# Patient Record
Sex: Male | Born: 1940
Health system: Southern US, Community
[De-identification: ages and names within clinical notes are randomized; demographics above are authoritative.]

## PROBLEM LIST (undated history)

## (undated) DIAGNOSIS — G20 Parkinson's disease: Secondary | ICD-10-CM

## (undated) DIAGNOSIS — R413 Other amnesia: Secondary | ICD-10-CM

## (undated) DIAGNOSIS — I10 Essential (primary) hypertension: Secondary | ICD-10-CM

## (undated) HISTORY — PX: DEEP THIGH / KNEE TUMOR EXCISION: SUR571

## (undated) HISTORY — DX: Essential (primary) hypertension: I10

## (undated) HISTORY — DX: Parkinson's disease: G20

---
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
## 2001-10-09 ENCOUNTER — Other Ambulatory Visit: Admission: RE | Admit: 2001-10-09 | Discharge: 2001-10-09 | Payer: Self-pay | Admitting: Urology

## 2005-11-05 ENCOUNTER — Encounter: Admission: RE | Admit: 2005-11-05 | Discharge: 2005-11-18 | Payer: Self-pay | Admitting: Family Medicine

## 2005-12-30 HISTORY — PX: CERVICAL DISC SURGERY: SHX588

## 2006-01-27 ENCOUNTER — Ambulatory Visit (HOSPITAL_COMMUNITY): Admission: RE | Admit: 2006-01-27 | Discharge: 2006-01-27 | Payer: Self-pay | Admitting: Neurosurgery

## 2006-03-24 ENCOUNTER — Encounter: Admission: RE | Admit: 2006-03-24 | Discharge: 2006-03-24 | Payer: Self-pay | Admitting: Neurosurgery

## 2006-06-30 ENCOUNTER — Encounter: Admission: RE | Admit: 2006-06-30 | Discharge: 2006-06-30 | Payer: Self-pay | Admitting: Orthopaedic Surgery

## 2006-12-30 HISTORY — PX: COLONOSCOPY: SHX174

## 2007-02-13 ENCOUNTER — Ambulatory Visit (HOSPITAL_COMMUNITY): Admission: RE | Admit: 2007-02-13 | Discharge: 2007-02-13 | Payer: Self-pay | Admitting: General Surgery

## 2012-06-02 DIAGNOSIS — D179 Benign lipomatous neoplasm, unspecified: Secondary | ICD-10-CM | POA: Diagnosis not present

## 2012-06-02 DIAGNOSIS — C4920 Malignant neoplasm of connective and soft tissue of unspecified lower limb, including hip: Secondary | ICD-10-CM | POA: Diagnosis not present

## 2012-06-02 DIAGNOSIS — D481 Neoplasm of uncertain behavior of connective and other soft tissue: Secondary | ICD-10-CM | POA: Insufficient documentation

## 2012-07-06 DIAGNOSIS — M542 Cervicalgia: Secondary | ICD-10-CM | POA: Diagnosis not present

## 2012-08-06 DIAGNOSIS — M542 Cervicalgia: Secondary | ICD-10-CM | POA: Diagnosis not present

## 2012-09-09 DIAGNOSIS — M542 Cervicalgia: Secondary | ICD-10-CM | POA: Diagnosis not present

## 2012-09-17 DIAGNOSIS — N401 Enlarged prostate with lower urinary tract symptoms: Secondary | ICD-10-CM | POA: Diagnosis not present

## 2012-10-02 DIAGNOSIS — N401 Enlarged prostate with lower urinary tract symptoms: Secondary | ICD-10-CM | POA: Diagnosis not present

## 2012-10-02 DIAGNOSIS — R972 Elevated prostate specific antigen [PSA]: Secondary | ICD-10-CM | POA: Diagnosis not present

## 2012-10-02 DIAGNOSIS — R3915 Urgency of urination: Secondary | ICD-10-CM | POA: Diagnosis not present

## 2012-10-26 DIAGNOSIS — Z23 Encounter for immunization: Secondary | ICD-10-CM | POA: Diagnosis not present

## 2012-11-16 DIAGNOSIS — I10 Essential (primary) hypertension: Secondary | ICD-10-CM | POA: Diagnosis not present

## 2012-11-16 DIAGNOSIS — C499 Malignant neoplasm of connective and soft tissue, unspecified: Secondary | ICD-10-CM | POA: Diagnosis not present

## 2012-11-16 DIAGNOSIS — R972 Elevated prostate specific antigen [PSA]: Secondary | ICD-10-CM | POA: Diagnosis not present

## 2012-11-16 DIAGNOSIS — Z23 Encounter for immunization: Secondary | ICD-10-CM | POA: Diagnosis not present

## 2013-03-25 DIAGNOSIS — L821 Other seborrheic keratosis: Secondary | ICD-10-CM | POA: Diagnosis not present

## 2013-03-25 DIAGNOSIS — L570 Actinic keratosis: Secondary | ICD-10-CM | POA: Diagnosis not present

## 2013-03-25 DIAGNOSIS — D485 Neoplasm of uncertain behavior of skin: Secondary | ICD-10-CM | POA: Diagnosis not present

## 2013-03-25 DIAGNOSIS — D239 Other benign neoplasm of skin, unspecified: Secondary | ICD-10-CM | POA: Diagnosis not present

## 2013-06-01 DIAGNOSIS — C4920 Malignant neoplasm of connective and soft tissue of unspecified lower limb, including hip: Secondary | ICD-10-CM | POA: Diagnosis not present

## 2013-06-01 DIAGNOSIS — D179 Benign lipomatous neoplasm, unspecified: Secondary | ICD-10-CM | POA: Diagnosis not present

## 2013-06-01 DIAGNOSIS — D481 Neoplasm of uncertain behavior of connective and other soft tissue: Secondary | ICD-10-CM | POA: Diagnosis not present

## 2013-06-09 DIAGNOSIS — I4460 Unspecified fascicular block: Secondary | ICD-10-CM | POA: Diagnosis not present

## 2013-06-09 DIAGNOSIS — Z01810 Encounter for preprocedural cardiovascular examination: Secondary | ICD-10-CM | POA: Diagnosis not present

## 2013-06-09 DIAGNOSIS — I10 Essential (primary) hypertension: Secondary | ICD-10-CM | POA: Diagnosis not present

## 2013-06-14 DIAGNOSIS — N400 Enlarged prostate without lower urinary tract symptoms: Secondary | ICD-10-CM | POA: Diagnosis not present

## 2013-06-14 DIAGNOSIS — G8918 Other acute postprocedural pain: Secondary | ICD-10-CM | POA: Diagnosis not present

## 2013-06-14 DIAGNOSIS — D1779 Benign lipomatous neoplasm of other sites: Secondary | ICD-10-CM | POA: Diagnosis not present

## 2013-06-14 DIAGNOSIS — C4920 Malignant neoplasm of connective and soft tissue of unspecified lower limb, including hip: Secondary | ICD-10-CM | POA: Diagnosis not present

## 2013-06-14 DIAGNOSIS — Z6833 Body mass index (BMI) 33.0-33.9, adult: Secondary | ICD-10-CM | POA: Diagnosis not present

## 2013-06-14 DIAGNOSIS — E669 Obesity, unspecified: Secondary | ICD-10-CM | POA: Diagnosis not present

## 2013-06-14 DIAGNOSIS — D481 Neoplasm of uncertain behavior of connective and other soft tissue: Secondary | ICD-10-CM | POA: Diagnosis not present

## 2013-06-14 DIAGNOSIS — C4922 Malignant neoplasm of connective and soft tissue of left lower limb, including hip: Secondary | ICD-10-CM | POA: Insufficient documentation

## 2013-06-14 DIAGNOSIS — D4819 Other specified neoplasm of uncertain behavior of connective and other soft tissue: Secondary | ICD-10-CM | POA: Diagnosis not present

## 2013-06-14 DIAGNOSIS — I10 Essential (primary) hypertension: Secondary | ICD-10-CM | POA: Diagnosis not present

## 2013-06-14 DIAGNOSIS — F17200 Nicotine dependence, unspecified, uncomplicated: Secondary | ICD-10-CM | POA: Diagnosis present

## 2013-06-16 DIAGNOSIS — N401 Enlarged prostate with lower urinary tract symptoms: Secondary | ICD-10-CM | POA: Insufficient documentation

## 2013-06-16 DIAGNOSIS — E669 Obesity, unspecified: Secondary | ICD-10-CM | POA: Insufficient documentation

## 2013-06-16 DIAGNOSIS — N400 Enlarged prostate without lower urinary tract symptoms: Secondary | ICD-10-CM | POA: Insufficient documentation

## 2013-06-16 DIAGNOSIS — I10 Essential (primary) hypertension: Secondary | ICD-10-CM | POA: Insufficient documentation

## 2013-06-24 DIAGNOSIS — Z483 Aftercare following surgery for neoplasm: Secondary | ICD-10-CM | POA: Diagnosis not present

## 2013-07-07 DIAGNOSIS — Z483 Aftercare following surgery for neoplasm: Secondary | ICD-10-CM | POA: Diagnosis not present

## 2013-07-07 DIAGNOSIS — D179 Benign lipomatous neoplasm, unspecified: Secondary | ICD-10-CM | POA: Diagnosis not present

## 2013-07-07 DIAGNOSIS — C4920 Malignant neoplasm of connective and soft tissue of unspecified lower limb, including hip: Secondary | ICD-10-CM | POA: Diagnosis not present

## 2013-07-27 DIAGNOSIS — C4920 Malignant neoplasm of connective and soft tissue of unspecified lower limb, including hip: Secondary | ICD-10-CM | POA: Diagnosis not present

## 2013-07-27 DIAGNOSIS — Z483 Aftercare following surgery for neoplasm: Secondary | ICD-10-CM | POA: Diagnosis not present

## 2013-09-30 DIAGNOSIS — R3915 Urgency of urination: Secondary | ICD-10-CM | POA: Diagnosis not present

## 2013-10-06 DIAGNOSIS — Z23 Encounter for immunization: Secondary | ICD-10-CM | POA: Diagnosis not present

## 2013-10-07 DIAGNOSIS — N401 Enlarged prostate with lower urinary tract symptoms: Secondary | ICD-10-CM | POA: Diagnosis not present

## 2013-10-07 DIAGNOSIS — R972 Elevated prostate specific antigen [PSA]: Secondary | ICD-10-CM | POA: Diagnosis not present

## 2014-02-01 DIAGNOSIS — M6250 Muscle wasting and atrophy, not elsewhere classified, unspecified site: Secondary | ICD-10-CM | POA: Diagnosis not present

## 2014-02-01 DIAGNOSIS — R609 Edema, unspecified: Secondary | ICD-10-CM | POA: Diagnosis not present

## 2014-02-01 DIAGNOSIS — C4920 Malignant neoplasm of connective and soft tissue of unspecified lower limb, including hip: Secondary | ICD-10-CM | POA: Diagnosis not present

## 2014-02-01 DIAGNOSIS — N400 Enlarged prostate without lower urinary tract symptoms: Secondary | ICD-10-CM | POA: Diagnosis not present

## 2014-02-07 DIAGNOSIS — R509 Fever, unspecified: Secondary | ICD-10-CM | POA: Diagnosis not present

## 2014-02-07 DIAGNOSIS — R6889 Other general symptoms and signs: Secondary | ICD-10-CM | POA: Diagnosis not present

## 2014-04-25 DIAGNOSIS — H269 Unspecified cataract: Secondary | ICD-10-CM | POA: Diagnosis not present

## 2014-04-25 DIAGNOSIS — H26019 Infantile and juvenile cortical, lamellar, or zonular cataract, unspecified eye: Secondary | ICD-10-CM | POA: Diagnosis not present

## 2014-06-01 DIAGNOSIS — Z23 Encounter for immunization: Secondary | ICD-10-CM | POA: Diagnosis not present

## 2014-06-01 DIAGNOSIS — I10 Essential (primary) hypertension: Secondary | ICD-10-CM | POA: Diagnosis not present

## 2014-08-02 DIAGNOSIS — R609 Edema, unspecified: Secondary | ICD-10-CM | POA: Diagnosis not present

## 2014-08-02 DIAGNOSIS — M6250 Muscle wasting and atrophy, not elsewhere classified, unspecified site: Secondary | ICD-10-CM | POA: Diagnosis not present

## 2014-08-02 DIAGNOSIS — Z4789 Encounter for other orthopedic aftercare: Secondary | ICD-10-CM | POA: Diagnosis not present

## 2014-08-02 DIAGNOSIS — N400 Enlarged prostate without lower urinary tract symptoms: Secondary | ICD-10-CM | POA: Diagnosis not present

## 2014-08-02 DIAGNOSIS — C4920 Malignant neoplasm of connective and soft tissue of unspecified lower limb, including hip: Secondary | ICD-10-CM | POA: Diagnosis not present

## 2014-08-02 DIAGNOSIS — Z483 Aftercare following surgery for neoplasm: Secondary | ICD-10-CM | POA: Diagnosis not present

## 2014-08-02 DIAGNOSIS — Z8589 Personal history of malignant neoplasm of other organs and systems: Secondary | ICD-10-CM | POA: Diagnosis not present

## 2014-08-02 DIAGNOSIS — J984 Other disorders of lung: Secondary | ICD-10-CM | POA: Diagnosis not present

## 2014-10-11 DIAGNOSIS — N401 Enlarged prostate with lower urinary tract symptoms: Secondary | ICD-10-CM | POA: Diagnosis not present

## 2014-10-14 DIAGNOSIS — R351 Nocturia: Secondary | ICD-10-CM | POA: Diagnosis not present

## 2014-10-14 DIAGNOSIS — R972 Elevated prostate specific antigen [PSA]: Secondary | ICD-10-CM | POA: Diagnosis not present

## 2014-10-14 DIAGNOSIS — N401 Enlarged prostate with lower urinary tract symptoms: Secondary | ICD-10-CM | POA: Diagnosis not present

## 2014-11-09 DIAGNOSIS — Z23 Encounter for immunization: Secondary | ICD-10-CM | POA: Diagnosis not present

## 2014-12-05 DIAGNOSIS — R197 Diarrhea, unspecified: Secondary | ICD-10-CM | POA: Diagnosis not present

## 2014-12-05 DIAGNOSIS — K529 Noninfective gastroenteritis and colitis, unspecified: Secondary | ICD-10-CM | POA: Diagnosis not present

## 2014-12-05 DIAGNOSIS — R011 Cardiac murmur, unspecified: Secondary | ICD-10-CM | POA: Diagnosis not present

## 2014-12-28 DIAGNOSIS — R011 Cardiac murmur, unspecified: Secondary | ICD-10-CM | POA: Diagnosis not present

## 2015-01-24 DIAGNOSIS — R05 Cough: Secondary | ICD-10-CM | POA: Diagnosis not present

## 2015-02-07 DIAGNOSIS — C4922 Malignant neoplasm of connective and soft tissue of left lower limb, including hip: Secondary | ICD-10-CM | POA: Diagnosis not present

## 2015-02-07 DIAGNOSIS — C4921 Malignant neoplasm of connective and soft tissue of right lower limb, including hip: Secondary | ICD-10-CM | POA: Diagnosis not present

## 2015-02-15 DIAGNOSIS — R972 Elevated prostate specific antigen [PSA]: Secondary | ICD-10-CM | POA: Diagnosis not present

## 2015-05-09 DIAGNOSIS — D1724 Benign lipomatous neoplasm of skin and subcutaneous tissue of left leg: Secondary | ICD-10-CM | POA: Diagnosis not present

## 2015-05-09 DIAGNOSIS — C4921 Malignant neoplasm of connective and soft tissue of right lower limb, including hip: Secondary | ICD-10-CM | POA: Diagnosis not present

## 2015-05-09 DIAGNOSIS — R52 Pain, unspecified: Secondary | ICD-10-CM | POA: Diagnosis not present

## 2015-05-09 DIAGNOSIS — N433 Hydrocele, unspecified: Secondary | ICD-10-CM | POA: Diagnosis not present

## 2015-05-22 DIAGNOSIS — H2513 Age-related nuclear cataract, bilateral: Secondary | ICD-10-CM | POA: Diagnosis not present

## 2015-05-22 DIAGNOSIS — H25013 Cortical age-related cataract, bilateral: Secondary | ICD-10-CM | POA: Diagnosis not present

## 2015-06-07 DIAGNOSIS — Z79899 Other long term (current) drug therapy: Secondary | ICD-10-CM | POA: Diagnosis not present

## 2015-06-07 DIAGNOSIS — I10 Essential (primary) hypertension: Secondary | ICD-10-CM | POA: Diagnosis not present

## 2015-06-07 DIAGNOSIS — R972 Elevated prostate specific antigen [PSA]: Secondary | ICD-10-CM | POA: Diagnosis not present

## 2015-06-07 DIAGNOSIS — C499 Malignant neoplasm of connective and soft tissue, unspecified: Secondary | ICD-10-CM | POA: Diagnosis not present

## 2015-06-07 DIAGNOSIS — B351 Tinea unguium: Secondary | ICD-10-CM | POA: Diagnosis not present

## 2015-06-26 DIAGNOSIS — B351 Tinea unguium: Secondary | ICD-10-CM | POA: Diagnosis not present

## 2015-06-26 DIAGNOSIS — R972 Elevated prostate specific antigen [PSA]: Secondary | ICD-10-CM | POA: Diagnosis not present

## 2015-06-26 DIAGNOSIS — Z79899 Other long term (current) drug therapy: Secondary | ICD-10-CM | POA: Diagnosis not present

## 2015-06-26 DIAGNOSIS — I10 Essential (primary) hypertension: Secondary | ICD-10-CM | POA: Diagnosis not present

## 2015-06-26 DIAGNOSIS — C499 Malignant neoplasm of connective and soft tissue, unspecified: Secondary | ICD-10-CM | POA: Diagnosis not present

## 2015-09-12 DIAGNOSIS — J309 Allergic rhinitis, unspecified: Secondary | ICD-10-CM | POA: Diagnosis not present

## 2015-09-12 DIAGNOSIS — R05 Cough: Secondary | ICD-10-CM | POA: Diagnosis not present

## 2015-10-11 DIAGNOSIS — R972 Elevated prostate specific antigen [PSA]: Secondary | ICD-10-CM | POA: Diagnosis not present

## 2015-10-20 DIAGNOSIS — N401 Enlarged prostate with lower urinary tract symptoms: Secondary | ICD-10-CM | POA: Diagnosis not present

## 2015-10-20 DIAGNOSIS — Z23 Encounter for immunization: Secondary | ICD-10-CM | POA: Diagnosis not present

## 2015-10-20 DIAGNOSIS — R351 Nocturia: Secondary | ICD-10-CM | POA: Diagnosis not present

## 2015-10-20 DIAGNOSIS — N138 Other obstructive and reflux uropathy: Secondary | ICD-10-CM | POA: Diagnosis not present

## 2015-10-20 DIAGNOSIS — R972 Elevated prostate specific antigen [PSA]: Secondary | ICD-10-CM | POA: Diagnosis not present

## 2015-11-14 DIAGNOSIS — Z4789 Encounter for other orthopedic aftercare: Secondary | ICD-10-CM | POA: Diagnosis not present

## 2015-11-14 DIAGNOSIS — D1723 Benign lipomatous neoplasm of skin and subcutaneous tissue of right leg: Secondary | ICD-10-CM | POA: Diagnosis not present

## 2015-11-14 DIAGNOSIS — C4921 Malignant neoplasm of connective and soft tissue of right lower limb, including hip: Secondary | ICD-10-CM | POA: Diagnosis not present

## 2016-05-18 DIAGNOSIS — R05 Cough: Secondary | ICD-10-CM | POA: Diagnosis not present

## 2016-05-21 DIAGNOSIS — D1723 Benign lipomatous neoplasm of skin and subcutaneous tissue of right leg: Secondary | ICD-10-CM | POA: Diagnosis not present

## 2016-05-21 DIAGNOSIS — Z4789 Encounter for other orthopedic aftercare: Secondary | ICD-10-CM | POA: Diagnosis not present

## 2016-05-21 DIAGNOSIS — C4921 Malignant neoplasm of connective and soft tissue of right lower limb, including hip: Secondary | ICD-10-CM | POA: Diagnosis not present

## 2016-06-07 DIAGNOSIS — R011 Cardiac murmur, unspecified: Secondary | ICD-10-CM | POA: Diagnosis not present

## 2016-06-07 DIAGNOSIS — I10 Essential (primary) hypertension: Secondary | ICD-10-CM | POA: Diagnosis not present

## 2016-06-07 DIAGNOSIS — N400 Enlarged prostate without lower urinary tract symptoms: Secondary | ICD-10-CM | POA: Diagnosis not present

## 2016-06-07 DIAGNOSIS — C499 Malignant neoplasm of connective and soft tissue, unspecified: Secondary | ICD-10-CM | POA: Diagnosis not present

## 2016-06-07 DIAGNOSIS — D696 Thrombocytopenia, unspecified: Secondary | ICD-10-CM | POA: Diagnosis not present

## 2016-06-07 DIAGNOSIS — R972 Elevated prostate specific antigen [PSA]: Secondary | ICD-10-CM | POA: Diagnosis not present

## 2016-06-07 DIAGNOSIS — J309 Allergic rhinitis, unspecified: Secondary | ICD-10-CM | POA: Diagnosis not present

## 2016-06-25 DIAGNOSIS — E669 Obesity, unspecified: Secondary | ICD-10-CM | POA: Diagnosis not present

## 2016-06-25 DIAGNOSIS — I10 Essential (primary) hypertension: Secondary | ICD-10-CM | POA: Diagnosis not present

## 2016-06-25 DIAGNOSIS — R011 Cardiac murmur, unspecified: Secondary | ICD-10-CM | POA: Diagnosis not present

## 2016-07-11 DIAGNOSIS — R011 Cardiac murmur, unspecified: Secondary | ICD-10-CM | POA: Diagnosis not present

## 2016-09-25 DIAGNOSIS — R05 Cough: Secondary | ICD-10-CM | POA: Diagnosis not present

## 2016-09-25 DIAGNOSIS — R251 Tremor, unspecified: Secondary | ICD-10-CM | POA: Diagnosis not present

## 2016-09-30 DIAGNOSIS — Z23 Encounter for immunization: Secondary | ICD-10-CM | POA: Diagnosis not present

## 2016-11-01 DIAGNOSIS — R351 Nocturia: Secondary | ICD-10-CM | POA: Diagnosis not present

## 2016-11-01 DIAGNOSIS — N401 Enlarged prostate with lower urinary tract symptoms: Secondary | ICD-10-CM | POA: Diagnosis not present

## 2016-11-01 DIAGNOSIS — R972 Elevated prostate specific antigen [PSA]: Secondary | ICD-10-CM | POA: Diagnosis not present

## 2016-11-19 DIAGNOSIS — C4921 Malignant neoplasm of connective and soft tissue of right lower limb, including hip: Secondary | ICD-10-CM | POA: Diagnosis not present

## 2016-11-19 DIAGNOSIS — Z9889 Other specified postprocedural states: Secondary | ICD-10-CM | POA: Diagnosis not present

## 2016-12-03 ENCOUNTER — Ambulatory Visit (INDEPENDENT_AMBULATORY_CARE_PROVIDER_SITE_OTHER): Payer: PPO | Admitting: Neurology

## 2016-12-03 ENCOUNTER — Encounter: Payer: Self-pay | Admitting: Neurology

## 2016-12-03 DIAGNOSIS — G20A1 Parkinson's disease without dyskinesia, without mention of fluctuations: Secondary | ICD-10-CM

## 2016-12-03 DIAGNOSIS — G20 Parkinson's disease: Secondary | ICD-10-CM | POA: Diagnosis not present

## 2016-12-03 HISTORY — DX: Parkinson's disease without dyskinesia, without mention of fluctuations: G20.A1

## 2016-12-03 HISTORY — DX: Parkinson's disease: G20

## 2016-12-03 MED ORDER — PRAMIPEXOLE DIHYDROCHLORIDE 0.125 MG PO TABS: 0.3750 mg | ORAL_TABLET | Freq: Three times a day (TID) | ORAL | 1 refills | Status: DC

## 2016-12-03 NOTE — Patient Instructions (Addendum)
With the Mirapex 0.125 mg tablet, begin one three times a day for 3 weeks, then take 2 three times a day for 3 weeks, then start 3 tablets three times a day.  Mirapex (pramipexole) may result in confusion or hallucinations, dizziness, drowsiness, or nausea. If any significant side effects are noted, please contact our office.    Parkinson Disease Parkinson disease is a long-term (chronic) condition that gets worse over time (is progressive). Parkinson disease limits your ability to control your movements and move your body normally. This condition is a type of movement disorder. Each person with Parkinson disease is affected differently. The condition can range from mild to severe. Parkinson disease tends to progress slowly over several years. What are the causes? Parkinson disease results from a loss of brain cells (neurons) in a specific part of the brain (substantia nigra). Some of the neurons in the substantia nigra make an important brain chemical (dopamine). Dopamine is needed to control movement. As the condition gets worse, neurons make less dopamine. This makes it hard to move or control your movements. The exact cause of why neurons are lost or produce less dopamine is not known. Genetic and environmental factors may contribute to the cause of Parkinson disease. What increases the risk? This condition is more likely to develop in:  Men.  People who are 7 years of age or older.  People who have a family history of Parkinson disease. What are the signs or symptoms? Symptoms of this condition can vary from person to person. The main (primary) symptoms are related to movement (motor symptoms). These include:  Uncontrolled shaking movements (tremor). Tremors usually start in a hand or foot when you are resting (resting tremor). The tremor may stop when you move around.  Slowing of movement. You may lose facial expression and have trouble making the small movements that are needed  to button clothing or brush your teeth. You may walk with short, shuffling steps.  Stiff movement (rigidity). This mostly affects your arms, legs, neck, and upper body. You may walk without swinging your arms. Rigidity can be painful.  Loss of balance and stability when standing. You may sway, fall backward, and have trouble making turns. Secondary motor symptoms of this condition include:  Shrinking handwriting.  Stooped posture.  Slowed speech.  Trouble swallowing.  Drooling.  Sexual dysfunction.  Muscle cramps.  Loss of smell. Additional symptoms that are not related to movement include:  Constipation.  Mood swings.  Depression or anxiety.  Sleep disturbances.  Confusion.  Loss of mental abilities (dementia).  Low blood pressure.  Trouble concentrating. How is this diagnosed? Parkinson disease can be hard to diagnose in its early stages. A diagnosis may be made based on symptoms, a medical history, and physical exam. During your exam, your health care provider will look for:  Lack of facial expression.  Resting tremor.  Stiffness in your neck, arms, and legs.  Abnormal walk.  Trouble with balance. You may have brain imaging tests done to check for a loss of dopamine-producing areas of the brain. Your healthcare provider may also grade the severity of your condition as mild, moderate, or advanced. Parkinson disease progression is different for everyone. You may not progress to the advanced stage. Mild Parkinson disease involves:  Movement problems that do not affect daily activities.  Movement problems on one side of the body.  Movement problems that are controlled with medicines.  Good response to exercise. Moderate Parkinson disease involves:  Movement problems on  both sides of the body.  Slowing of movement.  Coordination and balance problems.  Less of a response to medicine.  More side effects from medicines. Advanced Parkinson disease  involves:  Extreme difficulty walking.  Inability to live alone safely.  Signs of dementia.  Difficulty controlling symptoms with medicine. How is this treated? There is no cure for Parkinson disease. Treatment focuses on relieving your symptoms. Treatment may include:  Medicines.  Speech, occupational, and physical therapy.  Surgery. Everyone responds to medicines differently. Your response may change over time. Work with your health care provider to find the best medicines for you. These may include:  Dopamine replacement drugs. These are the most effective medicines. A long-term side effect of these medicines is uncontrolled movements (dyskinesias).  Dopamine agonists. These drugs act like dopamine to stimulate dopamine receptors in the brain. Side effects include nausea and sleepiness, but they cause less dyskinesia.  Other medicines to reduce tremor, prevent dopamine breakdown, reduce dyskinesia, and reduce dementia that is related to Parkinson disease. Another treatment is deep brain stimulation surgery to reduce tremors and dyskinesia. This procedure involves placing electrodes in the brain. The electrodes are attached to an electric pulse generator that acts like a pacemaker for your brain. This may be an option if you have had the condition for at least four years and are not responding well to medicines. Follow these instructions at home:  Take over-the-counter and prescription medicines only as told by your health care provider.  Install grab bars and railings in your home to prevent falls.  Follow instructions from your health care provider about eating or drinking restrictions.  Return to your normal activities as told by your health care provider. Ask your health care provider what activities are safe for you.  Get regular exercise as told by your health care provider or a physical therapist.  Keep all follow-up visits as told by your health care provider. This is  important. These include any visits with a speech therapist or occupational therapist.  Consider joining a support group for people with Parkinson disease. Contact a health care provider if:  Medicines do not help your symptoms.  You are unsteady or have fallen at home.  You need more support to function well at home.  You have trouble swallowing.  You have severe constipation.  You are struggling with side effects from your medicines.  You see or hear things that are not real (hallucinate).  You feel confused, anxious, or depressed. Get help right away if:  You are injured after a fall.  You cannot swallow without choking.  You have chest pain or trouble breathing.  You do not feel safe at home. This information is not intended to replace advice given to you by your health care provider. Make sure you discuss any questions you have with your health care provider. Document Released: 12/13/2000 Document Revised: 05/20/2016 Document Reviewed: 10/06/2015 Elsevier Interactive Patient Education  2017 [employer].

## 2016-12-03 NOTE — Progress Notes (Signed)
Reason for visit: Tremor  Referring physician: Dr. Milagros Reap Jacob Bolton is a 75 y.o. male  History of present illness:  Jacob Bolton is a 75 year old right-handed white male with a history of a tremor that developed about 6 or 7 months prior to this evaluation. 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 The patient has not noted any tremor affecting the head or neck or jaw or the voice. The patient indicates that his mother may have had a tremor. The patient reports some numbness of the right leg, but he denies any weakness of any of the extremities. He denies issues controlling the bowels or the bladder. He has not noted any changes in his walking or difficulty with shuffling his feet or with falling. He has not noted any change in speech. He was seen by his primary care physician, he is sent to this office for an evaluation. The patient does report some mild memory changes and word finding problems.  Past Medical History:  Diagnosis Date  . High blood pressure     Past Surgical History:  Procedure Laterality Date  . CERVICAL DISC SURGERY  2007   Ruptured disc, plates and screws  . COLONOSCOPY  2008  . 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 Leonides Schanz, [facility]    Family History  Problem Relation Age of Onset  . High blood pressure Mother   . Anxiety disorder Mother   . High blood pressure Father   . Heart attack Father     Social history:  reports that he has quit smoking. He has never used smokeless tobacco. He reports that he drinks about 1.8 oz of alcohol per week . He reports that he does not use drugs.  Medications:  Prior to Admission medications   Medication Sig Start Date End Date Taking? Authorizing Provider  aspirin EC 81 MG tablet Take 81 mg by mouth. 06/16/13  Yes Historical Provider, MD    Azelastine HCl 0.15 % SOLN  11/11/16  Yes Historical Provider, MD  b complex vitamins tablet Take 1 tablet by mouth daily.   Yes Historical Provider, MD  Cholecalciferol (VITAMIN D-1000 MAX ST) 1000 units tablet Take by mouth.   Yes Historical Provider, MD  co-enzyme Q-10 30 MG capsule Take 30 mg by mouth daily.   Yes Historical Provider, MD  dutasteride (AVODART) 0.5 MG capsule  10/23/16  Yes Historical Provider, MD  fluticasone (FLONASE) 50 MCG/ACT nasal spray  10/28/16  Yes Historical Provider, MD  losartan-hydrochlorothiazide (HYZAAR) 100-12.5 MG tablet Take by mouth.   Yes Historical Provider, MD  terazosin (HYTRIN) 10 MG capsule Take 10 mg by mouth. 05/20/12  Yes Historical Provider, MD  Turmeric 500 MG CAPS Take 1 capsule by mouth daily.   Yes Historical Provider, MD  vitamin E (VITAMIN E) 400 UNIT capsule Take 400 Units by mouth daily.   Yes Historical Provider, MD      Allergies  Allergen Reactions  . Sulfamethoxazole     Other reaction(s): Other (See Comments) Childhood allergy    ROS:  Out of a complete 14 system review of symptoms, the patient complains only of the following symptoms, and all other reviewed systems are negative.  Hearing loss Easy bruising Muscle cramps Allergies  Blood pressure (!) 145/92, pulse 66, height 5' 11.75" (  1.822 m), weight 238 lb (108 kg).  Physical Exam  General: The patient is alert and cooperative at the time of the examination. The patient is moderately obese.  Eyes: Pupils are equal, round, and reactive to light. Discs are flat bilaterally.  Neck: The neck is supple, no carotid bruits are noted.  Respiratory: The respiratory examination is clear.  Cardiovascular: The cardiovascular examination reveals a regular rate and rhythm, no obvious murmurs or rubs are noted.  Skin: Extremities are without significant edema.  Neurologic Exam  Mental status: The patient is alert and oriented x 3 at the time of the examination. The  patient has apparent normal recent and remote memory, with an apparently normal attention span and concentration ability.  Cranial nerves: Facial symmetry is present. There is good sensation of the face to pinprick and soft touch bilaterally. The strength of the facial muscles and the muscles to head turning and shoulder shrug are normal bilaterally. Speech is well enunciated, no aphasia or dysarthria is noted. Extraocular movements are full. Visual fields are full. The tongue is midline, and the patient has symmetric elevation of the soft palate. No obvious hearing deficits are noted. Masking of the face is seen.  Motor: The motor testing reveals 5 over 5 strength of all 4 extremities. Good symmetric motor tone is noted throughout.  Sensory: Sensory testing is intact to pinprick, soft touch, vibration sensation, and position sense on all 4 extremities. No evidence of extinction is noted.  Coordination: Cerebellar testing reveals good finger-nose-finger and heel-to-shin bilaterally. The patient has bilateral resting tremors of the hands, right greater than left.  Gait and station: The patient is able to rise from a seated position with arms crossed with some difficulty. Once up, there is some decreased arm swing bilaterally, right greater than left, tremor is seen with the right upper extremity with walking Tandem gait is normal. Romberg is negative. No drift is seen.  Reflexes: Deep tendon reflexes are symmetric and normal bilaterally. Toes are downgoing bilaterally.   Assessment/Plan:  1. Parkinson's disease  The patient has features of Parkinson's disease with masking of the face, resting tremors in the right greater than left upper extremity, with decreased arm swing. The patient will be started on low-dose Mirapex, gradually working up on the dose. He will undergo MRI of the brain. He will follow-up through this office in 4 months. The patient is to call if he has any concerns about side  effects on the medication. The patient is encouraged to exercise on a regular basis.  [name] MD 12/03/2016 9:10 AM  [facility] [address] [place], [place] [address]  Phone [phone] Fax [phone]

## 2016-12-17 ENCOUNTER — Ambulatory Visit
Admission: RE | Admit: 2016-12-17 | Discharge: 2016-12-17 | Disposition: A | Discharge status: Home / Self Care | Payer: PPO | Source: Ambulatory Visit | Attending: Neurology | Admitting: Neurology

## 2016-12-17 DIAGNOSIS — G20 Parkinson's disease: Secondary | ICD-10-CM

## 2016-12-18 ENCOUNTER — Telehealth: Payer: Self-pay | Admitting: Neurology

## 2016-12-18 NOTE — Telephone Encounter (Signed)
I called patient. MRI of the brain shows minimal white matter changes, no evidence of vascular parkinsonism. He is to continue the Mirapex.   MRI brain 12/18/16:  IMPRESSION:  Abnormal MRI brain (without) demonstrating: 1. Mild diffuse atrophy. 2. Minimal periventricular and subcortical foci of non-specific gliosis. 3. Left frontal juxtacortical foci of SWI hypointensity. 4. Non-specific fluid/inflammation in the right mastoid air cells. 5. No acute findings.

## 2016-12-19 NOTE — Telephone Encounter (Signed)
I called patient. MRI the brain showed minimal white matter changes, the patient does have Parkinson's disease therefore.  The patient will work up slowly on the Mirapex he will call if he requires a dose adjustment.

## 2016-12-19 NOTE — Telephone Encounter (Signed)
Pt is returning Dr. Jannifer Franklin' call.

## 2017-02-03 DIAGNOSIS — H25013 Cortical age-related cataract, bilateral: Secondary | ICD-10-CM | POA: Diagnosis not present

## 2017-02-03 DIAGNOSIS — H5203 Hypermetropia, bilateral: Secondary | ICD-10-CM | POA: Diagnosis not present

## 2017-02-03 DIAGNOSIS — H2513 Age-related nuclear cataract, bilateral: Secondary | ICD-10-CM | POA: Diagnosis not present

## 2017-02-08 ENCOUNTER — Other Ambulatory Visit: Payer: Self-pay | Admitting: Neurology

## 2017-02-10 NOTE — Telephone Encounter (Signed)
LVM for pt to call back to let us know how he is tolerating medication. Also wanted to make sure he is taking 3 tablets 3x/day now. He was titrating up to this dose. Advised our office is now closed, but we open tomorrow at 8am.

## 2017-02-11 NOTE — Telephone Encounter (Signed)
Patient is returning your call.  

## 2017-02-11 NOTE — Telephone Encounter (Signed)
Called pt back. He is doing well on medication. Now taking 3x/day. Verified pharmacy correct.

## 2017-02-11 NOTE — Telephone Encounter (Signed)
Patient called our office returning a nurse's call.  Please call

## 2017-02-11 NOTE — Telephone Encounter (Signed)
Returned call from pt. Asked him to call office back. Gave [facility] phone number.

## 2017-03-10 ENCOUNTER — Other Ambulatory Visit: Payer: Self-pay | Admitting: *Deleted

## 2017-03-10 MED ORDER — PRAMIPEXOLE DIHYDROCHLORIDE 0.125 MG PO TABS: ORAL_TABLET | ORAL | 0 refills | Status: DC

## 2017-04-08 DIAGNOSIS — D1723 Benign lipomatous neoplasm of skin and subcutaneous tissue of right leg: Secondary | ICD-10-CM | POA: Diagnosis not present

## 2017-04-11 ENCOUNTER — Ambulatory Visit: Payer: PPO | Admitting: Neurology

## 2017-04-14 ENCOUNTER — Ambulatory Visit: Payer: PPO | Admitting: Neurology

## 2017-04-14 ENCOUNTER — Telehealth: Payer: Self-pay | Admitting: *Deleted

## 2017-04-14 NOTE — Telephone Encounter (Signed)
Called and LVM for pt. Appt for 4/16 at 830am cx. f/u Office closed, no power. Will call back tomorrow to r/s his appt.

## 2017-04-15 NOTE — Telephone Encounter (Signed)
LVM on home and cell number (mailbox full- unable to LVM) for pt to call back to r/s appt from yesterday.

## 2017-04-15 NOTE — Telephone Encounter (Signed)
Called and spoke to pt. He was already called and scheduled for 04/30/17 at 2pm. Offered 4/19 at 1030am instead. He is agreeable to this. Would like to take this appt instead. Cx appt on 04/30/17 and r/s to 4/19 instead.

## 2017-04-17 ENCOUNTER — Encounter: Payer: Self-pay | Admitting: Neurology

## 2017-04-17 ENCOUNTER — Ambulatory Visit (INDEPENDENT_AMBULATORY_CARE_PROVIDER_SITE_OTHER): Payer: PPO | Admitting: Neurology

## 2017-04-17 ENCOUNTER — Encounter (INDEPENDENT_AMBULATORY_CARE_PROVIDER_SITE_OTHER): Payer: Self-pay

## 2017-04-17 VITALS — BP 142/88 | HR 59 | Ht 71.5 in | Wt 238.5 lb

## 2017-04-17 DIAGNOSIS — G20 Parkinson's disease: Secondary | ICD-10-CM

## 2017-04-17 MED ORDER — PRAMIPEXOLE DIHYDROCHLORIDE 0.5 MG PO TABS: 0.5000 mg | ORAL_TABLET | Freq: Three times a day (TID) | ORAL | 1 refills | Status: DC

## 2017-04-17 NOTE — Progress Notes (Signed)
Reason for visit: Parkinson's disease  Jacob Bolton is an 76 y.o. male  History of present illness:  Jacob Bolton is a 76 year old right-handed white male with a history of Parkinson's disease. The patient has been on low-dose Mirapex taking 0.125 mg, 3 tablets 3 times daily. The patient is tolerating this well. He still has some resting tremors of both upper extremities. He has not had any significant issues with balance, he reports no falls. He has not had any problems with speech or swallowing. He indicates that he is sleeping fairly well. He returns to this office for an evaluation.  Past Medical History:  Diagnosis Date  . High blood pressure   . Parkinson disease ([place]) 12/03/2016    Past Surgical History:  Procedure Laterality Date  . CERVICAL DISC SURGERY  2007   Ruptured disc, plates and screws  . COLONOSCOPY  2008  . 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 Leonides Schanz, [facility]    Family History  Problem Relation Age of Onset  . High blood pressure Mother   . Anxiety disorder Mother   . High blood pressure Father   . Heart attack Father     Social history:  reports that he has quit smoking. He has never used smokeless tobacco. He reports that he drinks about 1.8 oz of alcohol per week . He reports that he does not use drugs.    Allergies  Allergen Reactions  . Sulfamethoxazole     Other reaction(s): Other (See Comments) Childhood allergy    Medications:  Prior to Admission medications   Medication Sig Start Date End Date Taking? Authorizing Provider  aspirin EC 81 MG tablet Take 81 mg by mouth. 06/16/13   Historical Provider, MD  Azelastine HCl 0.15 % SOLN  11/11/16   Historical Provider, MD  b complex vitamins tablet Take 1 tablet by mouth daily.    Historical Provider, MD  Cholecalciferol (VITAMIN D-1000 MAX ST) 1000 units tablet Take by mouth.    Historical Provider, MD  co-enzyme Q-10 30 MG capsule Take 30 mg by mouth daily.     Historical Provider, MD  dutasteride (AVODART) 0.5 MG capsule  10/23/16   Historical Provider, MD  fluticasone Asencion Islam) 50 MCG/ACT nasal spray  10/28/16   Historical Provider, MD  losartan-hydrochlorothiazide (HYZAAR) 100-12.5 MG tablet Take by mouth.    Historical Provider, MD  pramipexole (MIRAPEX) 0.125 MG tablet TAKE 3 TABLETS 3 TIMES A DAY 03/10/17   Kathrynn Ducking, MD  terazosin (HYTRIN) 10 MG capsule Take 10 mg by mouth. 05/20/12   Historical Provider, MD  Turmeric 500 MG CAPS Take 1 capsule by mouth daily.    Historical Provider, MD  vitamin E (VITAMIN E) 400 UNIT capsule Take 400 Units by mouth daily.    Historical Provider, MD    ROS:  Out of a complete 14 system review of symptoms, the patient complains only of the following symptoms, and all other reviewed systems are negative.  Tremors  There were no vitals taken for this visit.  Physical Exam  General: The patient is alert and cooperative at the time of the examination.  Skin: No significant peripheral edema is noted.   Neurologic Exam  Mental status: The patient is alert and oriented x 3 at the time of the examination. The patient has apparent normal recent and remote memory, with an apparently normal attention span and concentration ability.   Cranial nerves: Facial symmetry is  present. Speech is normal, no aphasia or dysarthria is noted. Extraocular movements are full. Visual fields are full. Masking of the face is seen.  Motor: The patient has good strength in all 4 extremities.  Sensory examination: Soft touch sensation is symmetric on the face, arms, and legs.  Coordination: The patient has good finger-nose-finger and heel-to-shin bilaterally.  Gait and station: The patient is able to arise from a seated position with the arms crossed. Once up, the patient is able to ambulate independently, he has fairly symmetric arm swing but he does have tremor involving the left hand with walking. Tandem gait is normal.  Romberg is negative. No drift is seen. At times, resting tremor is also seen with the right hand.  Reflexes: Deep tendon reflexes are symmetric.   MRI brain 12/18/16:  IMPRESSION:  Abnormal MRI brain (without) demonstrating: 1. Mild diffuse atrophy. 2. Minimal periventricular and subcortical foci of non-specific gliosis. 3. Left frontal juxtacortical foci of SWI hypointensity. 4. Non-specific fluid/inflammation in the right mastoid air cells. 5. No acute findings.  * MRI scan images were reviewed online. I agree with the written report.   Assessment/Plan:  1. Parkinson's disease  The patient is doing well with low-dose Mirapex, we will increase the dose to 0.5 mg 3 times daily. The patient will return in about 5 months, he will call for any dose adjustments of the Mirapex.  Jacob Alexanders MD 04/17/2017 10:17 AM  [facility] [address] [place], [place] [address]  Phone [phone] Fax [phone]

## 2017-04-17 NOTE — Patient Instructions (Signed)
  We will go up on the mirapex to 0.5 mg three times a day.

## 2017-04-21 DIAGNOSIS — L821 Other seborrheic keratosis: Secondary | ICD-10-CM | POA: Diagnosis not present

## 2017-04-21 DIAGNOSIS — H61002 Unspecified perichondritis of left external ear: Secondary | ICD-10-CM | POA: Diagnosis not present

## 2017-04-21 DIAGNOSIS — L218 Other seborrheic dermatitis: Secondary | ICD-10-CM | POA: Diagnosis not present

## 2017-04-30 ENCOUNTER — Ambulatory Visit: Payer: PPO | Admitting: Neurology

## 2017-05-05 DIAGNOSIS — R972 Elevated prostate specific antigen [PSA]: Secondary | ICD-10-CM | POA: Diagnosis not present

## 2017-05-20 DIAGNOSIS — D1723 Benign lipomatous neoplasm of skin and subcutaneous tissue of right leg: Secondary | ICD-10-CM | POA: Insufficient documentation

## 2017-05-20 DIAGNOSIS — C4921 Malignant neoplasm of connective and soft tissue of right lower limb, including hip: Secondary | ICD-10-CM | POA: Diagnosis not present

## 2017-05-20 DIAGNOSIS — Z9889 Other specified postprocedural states: Secondary | ICD-10-CM | POA: Diagnosis not present

## 2017-06-06 DIAGNOSIS — D1723 Benign lipomatous neoplasm of skin and subcutaneous tissue of right leg: Secondary | ICD-10-CM | POA: Diagnosis not present

## 2017-06-06 DIAGNOSIS — C4921 Malignant neoplasm of connective and soft tissue of right lower limb, including hip: Secondary | ICD-10-CM | POA: Diagnosis not present

## 2017-06-06 DIAGNOSIS — Z87891 Personal history of nicotine dependence: Secondary | ICD-10-CM | POA: Diagnosis not present

## 2017-06-08 ENCOUNTER — Other Ambulatory Visit: Payer: Self-pay | Admitting: Neurology

## 2017-06-24 DIAGNOSIS — Z4802 Encounter for removal of sutures: Secondary | ICD-10-CM | POA: Diagnosis not present

## 2017-06-26 DIAGNOSIS — I350 Nonrheumatic aortic (valve) stenosis: Secondary | ICD-10-CM | POA: Diagnosis not present

## 2017-06-26 DIAGNOSIS — C499 Malignant neoplasm of connective and soft tissue, unspecified: Secondary | ICD-10-CM | POA: Diagnosis not present

## 2017-06-26 DIAGNOSIS — G20 Parkinson's disease: Secondary | ICD-10-CM | POA: Diagnosis not present

## 2017-06-26 DIAGNOSIS — Z13220 Encounter for screening for lipoid disorders: Secondary | ICD-10-CM | POA: Diagnosis not present

## 2017-06-26 DIAGNOSIS — R972 Elevated prostate specific antigen [PSA]: Secondary | ICD-10-CM | POA: Diagnosis not present

## 2017-06-26 DIAGNOSIS — I10 Essential (primary) hypertension: Secondary | ICD-10-CM | POA: Diagnosis not present

## 2017-06-26 DIAGNOSIS — D696 Thrombocytopenia, unspecified: Secondary | ICD-10-CM | POA: Diagnosis not present

## 2017-06-26 DIAGNOSIS — R7301 Impaired fasting glucose: Secondary | ICD-10-CM | POA: Diagnosis not present

## 2017-07-22 DIAGNOSIS — D1723 Benign lipomatous neoplasm of skin and subcutaneous tissue of right leg: Secondary | ICD-10-CM | POA: Diagnosis not present

## 2017-09-15 DIAGNOSIS — D126 Benign neoplasm of colon, unspecified: Secondary | ICD-10-CM | POA: Diagnosis not present

## 2017-09-15 DIAGNOSIS — Z1211 Encounter for screening for malignant neoplasm of colon: Secondary | ICD-10-CM | POA: Diagnosis not present

## 2017-09-15 DIAGNOSIS — K5730 Diverticulosis of large intestine without perforation or abscess without bleeding: Secondary | ICD-10-CM | POA: Diagnosis not present

## 2017-09-17 ENCOUNTER — Ambulatory Visit (INDEPENDENT_AMBULATORY_CARE_PROVIDER_SITE_OTHER): Payer: PPO | Admitting: Neurology

## 2017-09-17 ENCOUNTER — Encounter: Payer: Self-pay | Admitting: Neurology

## 2017-09-17 ENCOUNTER — Encounter (INDEPENDENT_AMBULATORY_CARE_PROVIDER_SITE_OTHER): Payer: Self-pay

## 2017-09-17 VITALS — BP 138/85 | HR 56 | Ht 71.5 in | Wt 236.5 lb

## 2017-09-17 DIAGNOSIS — G20 Parkinson's disease: Secondary | ICD-10-CM

## 2017-09-17 MED ORDER — PRAMIPEXOLE DIHYDROCHLORIDE 0.75 MG PO TABS: 0.7500 mg | ORAL_TABLET | Freq: Three times a day (TID) | ORAL | 1 refills | Status: DC

## 2017-09-17 NOTE — Progress Notes (Signed)
Reason for visit: Parkinson's disease  Jacob Bolton is an 76 y.o. male  History of present illness:  Jacob Bolton is a 76 year old right-handed white male with a history of Parkinson's disease. The patient has been on Mirapex taking 0.5 mg tablets 3 times daily. He is tolerating the medication well. He has not had any significant issues with balance or with falls, he denies any tremor. He has not had any trouble with swallowing or choking. He does have some mild issues with memory and word finding problems. He is sleeping fairly well at night, he denies any vivid dreams or history of acting out dreams. He returns to the office today for an evaluation.  Past Medical History:  Diagnosis Date  . High blood pressure   . Parkinson disease ([place]) 12/03/2016    Past Surgical History:  Procedure Laterality Date  . CERVICAL DISC SURGERY  2007   Ruptured disc, plates and screws  . COLONOSCOPY  2008  . 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 Leonides Schanz, [facility]    Family History  Problem Relation Age of Onset  . High blood pressure Mother   . Anxiety disorder Mother   . High blood pressure Father   . Heart attack Father     Social history:  reports that he has quit smoking. He has never used smokeless tobacco. He reports that he drinks about 1.8 oz of alcohol per week . He reports that he does not use drugs.    Allergies  Allergen Reactions  . Sulfamethoxazole     Other reaction(s): Other (See Comments) Childhood allergy    Medications:  Prior to Admission medications   Medication Sig Start Date End Date Taking? Authorizing Provider  aspirin EC 81 MG tablet Take 81 mg by mouth. 06/16/13  Yes [provider]  Azelastine HCl 0.15 % SOLN  11/11/16  Yes [provider]  Cholecalciferol (VITAMIN D-1000 MAX ST) 1000 units tablet Take by mouth.   Yes [provider]  dutasteride (AVODART) 0.5 MG capsule  10/23/16  Yes [provider]  fluticasone (FLONASE) 50 MCG/ACT nasal spray  10/28/16  Yes [provider]  losartan-hydrochlorothiazide (HYZAAR) 100-12.5 MG tablet Take by mouth.   Yes [provider]  pramipexole (MIRAPEX) 0.5 MG tablet Take 1 tablet (0.5 mg total) by mouth 3 (three) times daily. 04/17/17  Yes Kathrynn Ducking, MD  terazosin (HYTRIN) 10 MG capsule Take 10 mg by mouth. 05/20/12  Yes [provider]  Turmeric 500 MG CAPS Take 1 capsule by mouth daily.   Yes [provider]  vitamin E (VITAMIN E) 400 UNIT capsule Take 400 Units by mouth daily.   Yes [provider]    ROS:  Out of a complete 14 system review of symptoms, the patient complains only of the following symptoms, and all other reviewed systems are negative.  Hearing loss Double vision Cold intolerance Bruising easily Memory loss  Blood pressure 138/85, pulse (!) 56, height 5' 11.5" (1.816 m), weight 236 lb 8 oz (107.3 kg).  Physical Exam  General: The patient is alert and cooperative at the time of the examination. The patient is moderately obese.  Skin: No significant peripheral edema is noted.   Neurologic Exam  Mental status: The patient is alert and oriented x 3 at the time of the examination. The patient has apparent normal recent and remote memory, with an apparently normal attention span and concentration  ability.   Cranial nerves: Facial symmetry is present. Speech is normal, no aphasia or dysarthria is noted. Extraocular movements are full. Visual fields are full. Mild masking of the face is seen.  Motor: The patient has good strength in all 4 extremities.  Sensory examination: Soft touch sensation is symmetric on the face, arms, and legs.  Coordination: The patient has good finger-nose-finger and heel-to-shin bilaterally.  Gait and station: The patient is able to arise from seated position with arms crossed. Once up, the patient is able to ambulate  independently, he takes good stride, turns, symmetric arm swing is seen. Tandem gait is normal. Romberg is negative. No drift is seen.  Reflexes: Deep tendon reflexes are symmetric.   Assessment/Plan:  1. Parkinson's disease  2. Reported memory disturbance  The patient is overall doing fairly well. We will continue the Mirapex but increase the dose. The patient will phase in a dosing schedule of  0.75 mg tablet taking one 3 times daily. The patient will follow-up in 6 months, sooner if needed. We will need to follow the memory issues over time.   [name] MD 09/17/2017 9:23 AM  [facility] [address] [place], [place] [address]  Phone [phone] Fax [phone]

## 2017-09-17 NOTE — Patient Instructions (Signed)
   With the Mirapex we will convert to the 0.75 mg tablet:  Week 1-2  0.5/ 0.5/ 0.75  Week 3-4  0.75/ 0.5/ 0.75  Starting week 5  0.75 mg tablets three times a day.

## 2017-09-18 DIAGNOSIS — D126 Benign neoplasm of colon, unspecified: Secondary | ICD-10-CM | POA: Diagnosis not present

## 2017-09-18 DIAGNOSIS — Z1211 Encounter for screening for malignant neoplasm of colon: Secondary | ICD-10-CM | POA: Diagnosis not present

## 2017-10-08 ENCOUNTER — Other Ambulatory Visit: Payer: Self-pay | Admitting: Neurology

## 2017-10-27 DIAGNOSIS — R972 Elevated prostate specific antigen [PSA]: Secondary | ICD-10-CM | POA: Diagnosis not present

## 2017-11-03 DIAGNOSIS — R972 Elevated prostate specific antigen [PSA]: Secondary | ICD-10-CM | POA: Diagnosis not present

## 2017-11-03 DIAGNOSIS — N401 Enlarged prostate with lower urinary tract symptoms: Secondary | ICD-10-CM | POA: Diagnosis not present

## 2017-11-03 DIAGNOSIS — R350 Frequency of micturition: Secondary | ICD-10-CM | POA: Diagnosis not present

## 2017-11-03 DIAGNOSIS — R3915 Urgency of urination: Secondary | ICD-10-CM | POA: Diagnosis not present

## 2017-11-03 DIAGNOSIS — R351 Nocturia: Secondary | ICD-10-CM | POA: Diagnosis not present

## 2017-12-10 ENCOUNTER — Telehealth: Payer: Self-pay | Admitting: Neurology

## 2017-12-10 NOTE — Telephone Encounter (Signed)
I called the patient.  The patient is on a product containing Tylenol, Benadryl, and phenylephrine.  Phenylephrine should not be used with MAO B inhibitors, the patient is not on such a drug.  He is okay to use this preparation.

## 2017-12-10 NOTE — Telephone Encounter (Signed)
Patient called and stated that he has a bad cold, he has bought cough medicine but it says on the bottle that it cannot be taken if the patient has parkinsons. He wants to know if Dr. Jannifer Franklin has suggestions on what will be safe for him to take. Please call and advise.

## 2018-01-20 DIAGNOSIS — D1723 Benign lipomatous neoplasm of skin and subcutaneous tissue of right leg: Secondary | ICD-10-CM | POA: Diagnosis not present

## 2018-03-12 ENCOUNTER — Other Ambulatory Visit: Payer: Self-pay | Admitting: Neurology

## 2018-03-18 ENCOUNTER — Other Ambulatory Visit: Payer: Self-pay

## 2018-03-18 ENCOUNTER — Ambulatory Visit (INDEPENDENT_AMBULATORY_CARE_PROVIDER_SITE_OTHER): Payer: PPO | Admitting: Neurology

## 2018-03-18 ENCOUNTER — Encounter: Payer: Self-pay | Admitting: Neurology

## 2018-03-18 ENCOUNTER — Encounter (INDEPENDENT_AMBULATORY_CARE_PROVIDER_SITE_OTHER): Payer: Self-pay

## 2018-03-18 VITALS — BP 141/87 | HR 57 | Wt 244.0 lb

## 2018-03-18 DIAGNOSIS — G20 Parkinson's disease: Secondary | ICD-10-CM

## 2018-03-18 MED ORDER — PROPRANOLOL HCL 10 MG PO TABS: 10.0000 mg | ORAL_TABLET | Freq: Three times a day (TID) | ORAL | 1 refills | Status: DC | PRN

## 2018-03-18 NOTE — Patient Instructions (Signed)
   Use propranolol if needed for anxiety.  Call in 6 to 8 weeks, and we will convert to the 1.0 mg mirapex tablets.

## 2018-03-18 NOTE — Progress Notes (Signed)
Reason for visit: Parkinson's disease  Jacob Bolton is an 77 y.o. male  History of present illness:  Jacob Bolton is a 77 year old right-handed white male with a history of Parkinson's disease.  The patient is doing fairly well with Mirapex taking 0.75 mg 3 times daily.  He does have some fatigue during the day, he will oftentimes take an early afternoon nap.  He reports no problems with swallowing or choking.  He will drool sometimes at nighttime.  The patient sleeps well at night but he does occasionally have problems with acting out his dreams.  He has vivid dreams at times.  He has some word finding problems.  He denies any falls, he tries to walk on a regular basis.  He does have some resting tremor on the left hand at times.  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  The patient returns to this office for an evaluation.  Past Medical History:  Diagnosis Date  . High blood pressure   . Parkinson disease ([place]) 12/03/2016    Past Surgical History:  Procedure Laterality Date  . CERVICAL DISC SURGERY  2007   Ruptured disc, plates and screws  . COLONOSCOPY  2008  . 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 Leonides Schanz, [facility]    Family History  Problem Relation Age of Onset  . High blood pressure Mother   . Anxiety disorder Mother   . High blood pressure Father   . Heart attack Father     Social history:  reports that he has quit smoking. he has never used smokeless tobacco. He reports that he drinks about 1.8 oz of alcohol per week. He reports that he does not use drugs.    Allergies  Allergen Reactions  . Sulfamethoxazole     Other reaction(s): Other (See Comments) Childhood allergy    Medications:  Prior to Admission medications   Medication Sig Start Date End Date Taking? Authorizing Provider  Azelastine HCl 0.15 % SOLN  11/11/16  Yes [provider]  Cholecalciferol (VITAMIN D-1000  MAX ST) 1000 units tablet Take by mouth.   Yes [provider]  dutasteride (AVODART) 0.5 MG capsule  10/23/16  Yes [provider]  fluticasone (FLONASE) 50 MCG/ACT nasal spray  10/28/16  Yes [provider]  losartan-hydrochlorothiazide (HYZAAR) 100-12.5 MG tablet Take by mouth.   Yes [provider]  pramipexole (MIRAPEX) 0.75 MG tablet TAKE 1 TABLET BY MOUTH 3 (THREE) TIMES DAILY. 03/12/18  Yes Kathrynn Ducking, MD  terazosin (HYTRIN) 10 MG capsule Take 10 mg by mouth. 05/20/12  Yes [provider]    ROS:  Out of a complete 14 system review of symptoms, the patient complains only of the following symptoms, and all other reviewed systems are negative.  Anxiety Fatigue  Blood pressure (!) 141/87, pulse (!) 57, weight 244 lb (110.7 kg).  Physical Exam  General: The patient is alert and cooperative at the time of the examination.  Skin: No significant peripheral edema is noted.   Neurologic Exam  Mental status: The patient is alert and oriented x 3 at the time of the examination. The patient has apparent normal recent and remote memory, with an apparently normal attention span and concentration ability.   Cranial nerves: Facial symmetry is present. Speech is normal, no aphasia or dysarthria is noted. Extraocular movements are full. Visual fields are full.  Mild  masking of the face is seen.  Motor: The patient has good strength in all 4 extremities.  Sensory examination: Soft touch sensation is symmetric on the face, arms, and legs.  Coordination: The patient has good finger-nose-finger and heel-to-shin bilaterally.  An occasional resting tremors noted with the left upper extremity.  Gait and station: The patient has the ability to rise from a seated position with arms crossed.  Once up, the patient walk independently, he has symmetric arm swing.  Good turns are noted. Tandem gait is normal. Romberg is negative. No drift is  seen.  Reflexes: Deep tendon reflexes are symmetric.   Assessment/Plan:  1.  Parkinson's disease  The patient will be transitioned to the 1 mg Mirapex tablets, he will use up his current prescription of the 0.75 mg, he will call in about 8 weeks.  He will follow-up in 5 months.  Eventually, Sinemet will be added to the dosing schedule.  The patient likely has a REM sleep disorder.  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  [name] MD 03/18/2018 9:16 AM  [facility] [address] [place], [place] [address]  Phone [phone] Fax [phone]

## 2018-05-11 DIAGNOSIS — D225 Melanocytic nevi of trunk: Secondary | ICD-10-CM | POA: Diagnosis not present

## 2018-05-11 DIAGNOSIS — D485 Neoplasm of uncertain behavior of skin: Secondary | ICD-10-CM | POA: Diagnosis not present

## 2018-05-11 DIAGNOSIS — L570 Actinic keratosis: Secondary | ICD-10-CM | POA: Diagnosis not present

## 2018-05-11 DIAGNOSIS — L821 Other seborrheic keratosis: Secondary | ICD-10-CM | POA: Diagnosis not present

## 2018-06-09 ENCOUNTER — Telehealth: Payer: Self-pay | Admitting: Neurology

## 2018-06-09 ENCOUNTER — Other Ambulatory Visit: Payer: Self-pay | Admitting: Neurology

## 2018-06-09 MED ORDER — PRAMIPEXOLE DIHYDROCHLORIDE 1 MG PO TABS: 1.0000 mg | ORAL_TABLET | Freq: Three times a day (TID) | ORAL | 1 refills | Status: DC

## 2018-06-09 NOTE — Telephone Encounter (Signed)
Pt called requesting refills for pramipexole (MIRAPEX) 0.75 MG tablet also needing dosing changed to 1MG  sent to [facility]

## 2018-06-09 NOTE — Telephone Encounter (Signed)
A prescription for the 1 mg Mirapex tablets was sent in, the patient will take 1 tablet 3 times daily.

## 2018-07-08 DIAGNOSIS — R899 Unspecified abnormal finding in specimens from other organs, systems and tissues: Secondary | ICD-10-CM | POA: Diagnosis not present

## 2018-07-08 DIAGNOSIS — G20 Parkinson's disease: Secondary | ICD-10-CM | POA: Diagnosis not present

## 2018-07-08 DIAGNOSIS — Z85831 Personal history of malignant neoplasm of soft tissue: Secondary | ICD-10-CM | POA: Diagnosis not present

## 2018-07-08 DIAGNOSIS — R7301 Impaired fasting glucose: Secondary | ICD-10-CM | POA: Diagnosis not present

## 2018-07-08 DIAGNOSIS — Z86010 Personal history of colonic polyps: Secondary | ICD-10-CM | POA: Diagnosis not present

## 2018-07-08 DIAGNOSIS — I350 Nonrheumatic aortic (valve) stenosis: Secondary | ICD-10-CM | POA: Diagnosis not present

## 2018-07-08 DIAGNOSIS — N400 Enlarged prostate without lower urinary tract symptoms: Secondary | ICD-10-CM | POA: Diagnosis not present

## 2018-07-08 DIAGNOSIS — I10 Essential (primary) hypertension: Secondary | ICD-10-CM | POA: Diagnosis not present

## 2018-07-14 DIAGNOSIS — H6122 Impacted cerumen, left ear: Secondary | ICD-10-CM | POA: Diagnosis not present

## 2018-07-14 DIAGNOSIS — H90A21 Sensorineural hearing loss, unilateral, right ear, with restricted hearing on the contralateral side: Secondary | ICD-10-CM | POA: Diagnosis not present

## 2018-07-14 DIAGNOSIS — H903 Sensorineural hearing loss, bilateral: Secondary | ICD-10-CM | POA: Diagnosis not present

## 2018-07-21 DIAGNOSIS — D1723 Benign lipomatous neoplasm of skin and subcutaneous tissue of right leg: Secondary | ICD-10-CM | POA: Diagnosis not present

## 2018-07-31 DIAGNOSIS — H90A21 Sensorineural hearing loss, unilateral, right ear, with restricted hearing on the contralateral side: Secondary | ICD-10-CM | POA: Diagnosis not present

## 2018-07-31 DIAGNOSIS — H90A32 Mixed conductive and sensorineural hearing loss, unilateral, left ear with restricted hearing on the contralateral side: Secondary | ICD-10-CM | POA: Diagnosis not present

## 2018-08-10 DIAGNOSIS — I10 Essential (primary) hypertension: Secondary | ICD-10-CM | POA: Diagnosis not present

## 2018-08-10 DIAGNOSIS — C4921 Malignant neoplasm of connective and soft tissue of right lower limb, including hip: Secondary | ICD-10-CM | POA: Diagnosis not present

## 2018-08-10 DIAGNOSIS — G8918 Other acute postprocedural pain: Secondary | ICD-10-CM | POA: Diagnosis not present

## 2018-08-17 ENCOUNTER — Other Ambulatory Visit: Payer: Self-pay

## 2018-08-17 NOTE — Patient Outreach (Signed)
[facility]) Care Management  08/17/2018  SHADI SESSLER 09/18/1941 [id number]     Transition of Care Referral  Referral Date: 08/17/18 Referral Source: [facility] Discharge Report Date of Admission: unknown Diagnosis: unknown Date of Discharge: 08/11/18 Facility: [facility] Insurance: HTA   Referral received. No outreach warranted at this time. TOC will be completed by primary care provider office who will refer to [facility] care mgmt if needed.   Plan: RN CM will close case at this time.   Enzo Montgomery, RN,BSN,CCM [place] Telephonic Care Management Coordinator Direct Phone: [phone] Toll Free: [phone] Fax: [phone]

## 2018-08-25 DIAGNOSIS — Z4802 Encounter for removal of sutures: Secondary | ICD-10-CM | POA: Diagnosis not present

## 2018-09-08 DIAGNOSIS — G20 Parkinson's disease: Secondary | ICD-10-CM | POA: Diagnosis not present

## 2018-09-08 DIAGNOSIS — Z85831 Personal history of malignant neoplasm of soft tissue: Secondary | ICD-10-CM | POA: Diagnosis not present

## 2018-09-08 DIAGNOSIS — I350 Nonrheumatic aortic (valve) stenosis: Secondary | ICD-10-CM | POA: Diagnosis not present

## 2018-09-08 DIAGNOSIS — R7301 Impaired fasting glucose: Secondary | ICD-10-CM | POA: Diagnosis not present

## 2018-09-08 DIAGNOSIS — I10 Essential (primary) hypertension: Secondary | ICD-10-CM | POA: Diagnosis not present

## 2018-09-08 DIAGNOSIS — Z86010 Personal history of colonic polyps: Secondary | ICD-10-CM | POA: Diagnosis not present

## 2018-09-08 DIAGNOSIS — N400 Enlarged prostate without lower urinary tract symptoms: Secondary | ICD-10-CM | POA: Diagnosis not present

## 2018-09-08 DIAGNOSIS — R799 Abnormal finding of blood chemistry, unspecified: Secondary | ICD-10-CM | POA: Diagnosis not present

## 2018-09-08 DIAGNOSIS — R899 Unspecified abnormal finding in specimens from other organs, systems and tissues: Secondary | ICD-10-CM | POA: Diagnosis not present

## 2018-09-18 ENCOUNTER — Ambulatory Visit (INDEPENDENT_AMBULATORY_CARE_PROVIDER_SITE_OTHER): Payer: PPO | Admitting: Neurology

## 2018-09-18 ENCOUNTER — Encounter: Payer: Self-pay | Admitting: Neurology

## 2018-09-18 VITALS — BP 136/87 | HR 64 | Ht 71.0 in | Wt 228.0 lb

## 2018-09-18 DIAGNOSIS — G20 Parkinson's disease: Secondary | ICD-10-CM | POA: Diagnosis not present

## 2018-09-18 MED ORDER — CARBIDOPA-LEVODOPA ER 25-100 MG PO TBCR: 0.5000 | EXTENDED_RELEASE_TABLET | Freq: Three times a day (TID) | ORAL | 1 refills | Status: DC

## 2018-09-18 NOTE — Patient Instructions (Signed)
We will add Sinemet 25/100 mg tablet, 1/2 tablet three times a day to the mirapex.  Sinemet (carbidopa) may result in confusion or hallucinations, drowsiness, nausea, or dizziness. If any significant side effects are noted, please contact our office. Sinemet may not be well absorbed when taken with high protein meals, if tolerated it is best to take 30-45 minutes before you eat.

## 2018-09-18 NOTE — Progress Notes (Signed)
Reason for visit: Parkinson's disease  Jacob Bolton is an 77 y.o. male  History of present illness:  Jacob Bolton is a 77 year old right-handed white male with a history of Parkinson's disease.  The patient has been on Mirapex taking 1 mg 3 times daily.  He has had relatively good mobility, he has not had any falls.  He has had one episode where he may have briefly falling asleep while working on the computer.  He otherwise has no problem with driving.  He denies problems with swallowing or choking.  He sleeps well at night but he has to get up 3 or 4 times to use the bathroom because of his prostate.  He is followed through urology.  The patient comes back to this office for an evaluation.  He works as an Training and development officer, he is able to perform these tasks fairly well.  Past Medical History:  Diagnosis Date  . High blood pressure   . Parkinson disease ([place]) 12/03/2016    Past Surgical History:  Procedure Laterality Date  . CERVICAL DISC SURGERY  2007   Ruptured disc, plates and screws  . COLONOSCOPY  2008  . 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 Leonides Schanz, [facility]    Family History  Problem Relation Age of Onset  . High blood pressure Mother   . Anxiety disorder Mother   . High blood pressure Father   . Heart attack Father     Social history:  reports that he has quit smoking. He has never used smokeless tobacco. He reports that he drinks about 3.0 standard drinks of alcohol per week. He reports that he does not use drugs.    Allergies  Allergen Reactions  . Sulfamethoxazole     Other reaction(s): Other (See Comments) Childhood allergy    Medications:  Prior to Admission medications   Medication Sig Start Date End Date Taking? Authorizing Provider  Azelastine HCl 0.15 % SOLN  11/11/16  Yes [provider]  Cholecalciferol (VITAMIN D-1000 MAX ST) 1000 units tablet Take by mouth.   Yes [provider]  dutasteride (AVODART) 0.5 MG  capsule  10/23/16  Yes [provider]  fluticasone (FLONASE) 50 MCG/ACT nasal spray  10/28/16  Yes [provider]  losartan-hydrochlorothiazide (HYZAAR) 100-12.5 MG tablet Take by mouth.   Yes [provider]  pramipexole (MIRAPEX) 1 MG tablet Take 1 tablet (1 mg total) by mouth 3 (three) times daily. 06/09/18  Yes Kathrynn Ducking, MD  propranolol (INDERAL) 10 MG tablet Take 1 tablet (10 mg total) by mouth 3 (three) times daily as needed. 03/18/18  Yes Kathrynn Ducking, MD  terazosin (HYTRIN) 10 MG capsule Take 10 mg by mouth. 05/20/12  Yes [provider]  Carbidopa-Levodopa ER (SINEMET CR) 25-100 MG tablet controlled release Take 0.5 tablets by mouth 3 (three) times daily. 09/18/18   Kathrynn Ducking, MD    ROS:  Out of a complete 14 system review of symptoms, the patient complains only of the following symptoms, and all other reviewed systems are negative.  Chills Hearing loss Cold intolerance Frequency of urination Back pain, muscle cramps Bruising easily Memory loss Hallucinations  Blood pressure 136/87, pulse 64, height 5\' 11"  (1.803 m), weight 228 lb (103.4 kg).  Physical Exam  General: The patient is alert and cooperative at the time of the examination.  Skin: No significant peripheral edema is noted.   Neurologic Exam  Mental status: The  patient is alert and oriented x 3 at the time of the examination. The patient has apparent normal recent and remote memory, with an apparently normal attention span and concentration ability.   Cranial nerves: Facial symmetry is present. Speech is normal, no aphasia or dysarthria is noted. Extraocular movements are full. Visual fields are full.  Masking of the face is seen.  Motor: The patient has good strength in all 4 extremities.  Sensory examination: Soft touch sensation is symmetric on the face, arms, and legs.   Coordination: The patient has good finger-nose-finger and heel-to-shin  bilaterally.  No resting tremors noted.  Gait and station: The patient has the ability to arise from a seated position with arms crossed.  Once up, the patient can walk independently, slightly decreased arm swing seen bilaterally.  Tandem gait is normal. Romberg is negative. No drift is seen.  Reflexes: Deep tendon reflexes are symmetric.   Assessment/Plan:  1.  Parkinson's disease  The patient will be started on low-dose Sinemet taking the 25/100 mg tablet, he is to take 1/2 tablet 3 times daily.  The patient will remain on Mirapex 1 mg 3 times daily.  Will follow-up in 6 months.  He will call for any dose adjustments.   [name] MD 09/18/2018 10:36 AM  [facility] [address] [place], [place] [address]  Phone [phone] Fax [phone]

## 2018-09-22 DIAGNOSIS — C4921 Malignant neoplasm of connective and soft tissue of right lower limb, including hip: Secondary | ICD-10-CM | POA: Diagnosis not present

## 2018-09-30 DIAGNOSIS — Z23 Encounter for immunization: Secondary | ICD-10-CM | POA: Diagnosis not present

## 2018-10-29 DIAGNOSIS — R972 Elevated prostate specific antigen [PSA]: Secondary | ICD-10-CM | POA: Diagnosis not present

## 2018-11-10 ENCOUNTER — Telehealth: Payer: Self-pay | Admitting: Neurology

## 2018-11-10 NOTE — Telephone Encounter (Signed)
Pt calling because for about a week he has been dealing with a bad case re: his allergies, he states that it is not a cold.  He would like to know what Dr Jannifer Franklin would suggest he take for his allergies, he has had constant nasal dripping.

## 2018-11-10 NOTE — Telephone Encounter (Signed)
LMOM (identified vm) that his pcp would be the most appropriate person to answer this question for him.  He does not need to return this call unless he has other questions for this office/fim

## 2018-11-11 DIAGNOSIS — J309 Allergic rhinitis, unspecified: Secondary | ICD-10-CM | POA: Diagnosis not present

## 2018-12-04 ENCOUNTER — Other Ambulatory Visit: Payer: Self-pay | Admitting: Neurology

## 2018-12-10 ENCOUNTER — Other Ambulatory Visit (HOSPITAL_BASED_OUTPATIENT_CLINIC_OR_DEPARTMENT_OTHER): Payer: Self-pay | Admitting: Family Medicine

## 2018-12-10 ENCOUNTER — Encounter (HOSPITAL_BASED_OUTPATIENT_CLINIC_OR_DEPARTMENT_OTHER): Payer: Self-pay | Admitting: *Deleted

## 2018-12-10 ENCOUNTER — Emergency Department (HOSPITAL_BASED_OUTPATIENT_CLINIC_OR_DEPARTMENT_OTHER)
Admission: EM | Admit: 2018-12-10 | Discharge: 2018-12-10 | Disposition: A | Discharge status: Home / Self Care | Payer: PPO | Attending: Emergency Medicine | Admitting: Emergency Medicine

## 2018-12-10 ENCOUNTER — Ambulatory Visit (HOSPITAL_BASED_OUTPATIENT_CLINIC_OR_DEPARTMENT_OTHER)
Admission: RE | Admit: 2018-12-10 | Discharge: 2018-12-10 | Disposition: A | Discharge status: Home / Self Care | Payer: PPO | Source: Ambulatory Visit | Attending: Family Medicine | Admitting: Family Medicine

## 2018-12-10 ENCOUNTER — Other Ambulatory Visit: Payer: Self-pay | Admitting: Family Medicine

## 2018-12-10 ENCOUNTER — Other Ambulatory Visit: Payer: Self-pay

## 2018-12-10 DIAGNOSIS — I82452 Acute embolism and thrombosis of left peroneal vein: Secondary | ICD-10-CM | POA: Insufficient documentation

## 2018-12-10 DIAGNOSIS — Z87891 Personal history of nicotine dependence: Secondary | ICD-10-CM | POA: Insufficient documentation

## 2018-12-10 DIAGNOSIS — I824Y2 Acute embolism and thrombosis of unspecified deep veins of left proximal lower extremity: Secondary | ICD-10-CM

## 2018-12-10 DIAGNOSIS — G20 Parkinson's disease: Secondary | ICD-10-CM | POA: Insufficient documentation

## 2018-12-10 DIAGNOSIS — I82442 Acute embolism and thrombosis of left tibial vein: Secondary | ICD-10-CM | POA: Insufficient documentation

## 2018-12-10 DIAGNOSIS — I82432 Acute embolism and thrombosis of left popliteal vein: Secondary | ICD-10-CM | POA: Insufficient documentation

## 2018-12-10 DIAGNOSIS — Z79899 Other long term (current) drug therapy: Secondary | ICD-10-CM | POA: Diagnosis not present

## 2018-12-10 DIAGNOSIS — M7989 Other specified soft tissue disorders: Secondary | ICD-10-CM

## 2018-12-10 DIAGNOSIS — I82412 Acute embolism and thrombosis of left femoral vein: Secondary | ICD-10-CM | POA: Insufficient documentation

## 2018-12-10 DIAGNOSIS — C499 Malignant neoplasm of connective and soft tissue, unspecified: Secondary | ICD-10-CM | POA: Diagnosis not present

## 2018-12-10 DIAGNOSIS — R2242 Localized swelling, mass and lump, left lower limb: Secondary | ICD-10-CM | POA: Diagnosis present

## 2018-12-10 LAB — BASIC METABOLIC PANEL
Anion gap: 8 (ref 5–15)
BUN: 19 mg/dL (ref 8–23)
CO2: 25 mmol/L (ref 22–32)
Calcium: 8.7 mg/dL — ABNORMAL LOW (ref 8.9–10.3)
Chloride: 103 mmol/L (ref 98–111)
Creatinine, Ser: 0.87 mg/dL (ref 0.61–1.24)
GFR calc Af Amer: >60 mL/min (ref >60)
GFR calc non Af Amer: >60 mL/min (ref >60)
Glucose, Bld: 105 mg/dL — ABNORMAL HIGH (ref 70–99)
Potassium: 3.5 mmol/L (ref 3.5–5.1)
Sodium: 136 mmol/L (ref 135–145)

## 2018-12-10 LAB — CBC
HCT: 39.9 % (ref 39.0–52.0)
Hemoglobin: 13.1 g/dL (ref 13.0–17.0)
MCH: 30.8 pg (ref 26.0–34.0)
MCHC: 32.8 g/dL (ref 30.0–36.0)
MCV: 93.9 fL (ref 80.0–100.0)
Platelets: 220 10*3/uL (ref 150–400)
RBC: 4.25 MIL/uL (ref 4.22–5.81)
RDW: 12.9 % (ref 11.5–15.5)
WBC: 5.1 10*3/uL (ref 4.0–10.5)
nRBC: 0 % (ref 0.0–0.2)

## 2018-12-10 MED ORDER — APIXABAN 2.5 MG PO TABS
10.0000 mg | ORAL_TABLET | Freq: Once | ORAL | Status: AC
  Administered 2018-12-10: 10 mg via ORAL
  Filled 2018-12-10: qty 4

## 2018-12-10 MED ORDER — ELIQUIS 5 MG VTE STARTER PACK: ORAL_TABLET | ORAL | 0 refills | Status: DC

## 2018-12-10 MED ORDER — APIXABAN (ELIQUIS) EDUCATION KIT FOR DVT/PE PATIENTS
PACK | Freq: Once | Status: AC
  Administered 2018-12-10: 20:00:00

## 2018-12-10 MED ORDER — HEPARIN SOD (PORK) LOCK FLUSH 100 UNIT/ML IV SOLN
INTRAVENOUS | Status: AC
  Filled 2018-12-10: qty 5

## 2018-12-10 NOTE — ED Triage Notes (Signed)
Left leg pain with swelling. He was seen by his MD today and had an [place] that was positive for DVT.

## 2018-12-10 NOTE — Discharge Instructions (Addendum)
Return to the ER immediately if you have any breathing problems, chest pain, passing out, fever, or severe pain.  Also return to ER if you have any bleeding problems or if you hit your head.

## 2018-12-10 NOTE — ED Notes (Signed)
ED Provider at bedside. 

## 2018-12-11 MED FILL — ELIQUIS 5 MG TABLET: 5 | 29 days supply | Qty: 72 | Fill #0

## 2018-12-11 NOTE — ED Provider Notes (Signed)
[facility] EMERGENCY DEPARTMENT Provider Note   CSN: [id number] Arrival date & time: 12/10/18  1611     History   Chief Complaint Chief Complaint  Patient presents with  . Leg Pain    HPI Jacob Bolton is a 77 y.o. male.  77yo M w/ PMH including Parkinson's, HTN, sarcoma of R leg who p/w L leg swelling. 1 to 2 days ago, he noticed some swelling of his L leg. He elevated his leg but the next morning his symptoms did not improve so he saw his PCP today.  He had an ultrasound that was positive for DVT and he was sent here for further evaluation.  He denies any pain in his leg and reports normal sensation.  He denies any associated chest pain, shortness of breath, dyspnea on exertion, or any other complaints.  No history of blood clot, recent travel, or recent immobilization.   The history is provided by the patient.  Leg Pain      Past Medical History:  Diagnosis Date  . High blood pressure   . Parkinson disease ([place]) 12/03/2016    Patient Active Problem List   Diagnosis Date Noted  . Parkinson disease ([place]) 12/03/2016    Past Surgical History:  Procedure Laterality Date  . CERVICAL DISC SURGERY  2007   Ruptured disc, plates and screws  . COLONOSCOPY  2008  . 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 Leonides Schanz, [facility] Medications    Prior to Admission medications   Medication Sig Start Date End Date Taking? Authorizing Provider  Azelastine HCl 0.15 % SOLN  11/11/16   [provider]  Carbidopa-Levodopa ER (SINEMET CR) 25-100 MG tablet controlled release Take 0.5 tablets by mouth 3 (three) times daily. 09/18/18   Kathrynn Ducking, MD  Cholecalciferol (VITAMIN D-1000 MAX ST) 1000 units tablet Take by mouth.    [provider]  dutasteride (AVODART) 0.5 MG capsule  10/23/16   [provider]  ELIQUIS STARTER PACK (ELIQUIS STARTER PACK) 5 MG TABS Take as directed on package: start with two-80m  tablets twice daily for 7 days. On day 8, switch to one-561mtablet twice daily. 12/10/18   Caliber Landess, RaWenda OverlandMD  fluticasone (FAsencion Islam50 MCG/ACT nasal spray  10/28/16   [provider]  losartan-hydrochlorothiazide (HYZAAR) 100-12.5 MG tablet Take by mouth.    [provider]  pramipexole (MIRAPEX) 1 MG tablet TAKE 1 TABLET (1 MG TOTAL) BY MOUTH 3 (THREE) TIMES DAILY. 12/04/18   WiKathrynn DuckingMD  propranolol (INDERAL) 10 MG tablet Take 1 tablet (10 mg total) by mouth 3 (three) times daily as needed. 03/18/18   WiKathrynn DuckingMD  terazosin (HYTRIN) 10 MG capsule Take 10 mg by mouth. 05/20/12   [provider]    Family History Family History  Problem Relation Age of Onset  . High blood pressure Mother   . Anxiety disorder Mother   . High blood pressure Father   . Heart attack Father     Social History Social History   Tobacco Use  . Smoking status: Former SmResearch scientist (life sciences). Smokeless tobacco: Never Used  Substance Use Topics  . Alcohol use: Yes    Alcohol/week: 3.0 standard drinks    Types: 3 Glasses of wine per week    Comment: WIne 2-3 nights per week  . Drug use: No     Allergies  Sulfamethoxazole   Review of Systems Review of Systems All other systems reviewed and are negative except that which was mentioned in HPI   Physical Exam Updated Vital Signs BP (!) 137/95 (BP Location: Right Arm)   Pulse 75   Temp 98.4 F (36.9 C) (Oral)   Resp 16   Ht '5\' 11"'  (9.381 m)   Wt 100.2 kg   SpO2 99%   BMI 30.82 kg/m   Physical Exam Vitals signs and nursing note reviewed.  Constitutional:      General: He is not in acute distress.    Appearance: He is well-developed.  HENT:     Head: Normocephalic and atraumatic.  Eyes:     Conjunctiva/sclera: Conjunctivae normal.  Neck:     Musculoskeletal: Neck supple.  Cardiovascular:     Rate and Rhythm: Normal rate and regular rhythm.     Pulses: Normal pulses.     Heart sounds: Normal heart  sounds. No murmur.  Pulmonary:     Effort: Pulmonary effort is normal.     Breath sounds: Normal breath sounds.  Abdominal:     General: Bowel sounds are normal. There is no distension.     Palpations: Abdomen is soft.     Tenderness: There is no abdominal tenderness.  Musculoskeletal:        General: Swelling present. No tenderness.     Comments: Edema of L lower leg distal to knee without tenderness; 2+ DP pulses  Skin:    General: Skin is warm and dry.     Findings: No erythema.  Neurological:     Mental Status: He is alert and oriented to person, place, and time.     Sensory: No sensory deficit.     Comments: Fluent speech  Psychiatric:        Judgment: Judgment normal.      ED Treatments / Results  Labs (all labs ordered are listed, but only abnormal results are displayed) Labs Reviewed  BASIC METABOLIC PANEL - Abnormal; Notable for the following components:      Result Value   Glucose, Bld 105 (*)    Calcium 8.7 (*)    All other components within normal limits  CBC    EKG None  Radiology US Venous Img Lower Unilateral Left  Result Date: 12/10/2018 CLINICAL DATA:  Left lower extremity swelling for 2 days EXAM: LEFT LOWER EXTREMITY VENOUS DUPLEX ULTRASOUND TECHNIQUE: Doppler venous assessment of the left lower extremity deep venous system was performed, including characterization of spectral flow, compressibility, and phasicity. COMPARISON:  None. FINDINGS: The left common femoral vein, proximal left femoral vein, and mid left femoral vein are compressible. The distal femoral vein and popliteal vein are partially compressible containing partially occlusive thrombus. There is also occlusive thrombus in the peroneal vein. There is nonocclusive thrombus in the posterior tibial vein. Doppler analysis demonstrates phasicity. Augmentation was deferred. IMPRESSION: The study is positive for left lower extremity DVT as described. There is partially occlusive thrombus in the  femoral vein, popliteal vein, and peroneal vein. There is occlusive thrombus in the posterior tibial vein. Electronically Signed   By: Marybelle Killings M.D.   On: 12/10/2018 15:46    Procedures Procedures (including critical care time)  Medications Ordered in ED Medications  apixaban (ELIQUIS) tablet 10 mg (10 mg Oral Given 12/10/18 2008)  apixaban [facility]) Education Kit for DVT/PE patients ( Does not apply Given 12/10/18 2023)     Initial Impression / Assessment and Plan / ED Course  I have reviewed the triage vital signs and the nursing notes.  Pertinent labs & imaging results that were available during my care of the patient were reviewed by me and considered in my medical decision making (see chart for details).     I reviewed [place] from today which confirms DVT. Pt does have risk factor of cancer hx. He denies any CP, SOB or other complaints to suggest PE. No h/o bleeding problems. Cr normal, CBC normal.  Gust risks and benefits of anticoagulation, patient voiced understanding.  Initiated Eliquis and provided with voucher and prescription.  He will follow-up with his PCP for further management and has voiced understanding of return precautions.  Final Clinical Impressions(s) / ED Diagnoses   Final diagnoses:  Acute deep vein thrombosis (DVT) of proximal vein of left lower extremity [facility])    ED Discharge Orders         Ordered    ELIQUIS STARTER PACK ([place]) 5 MG TABS     12/10/18 2002           Tishie Altmann, Wenda Overland, MD 12/11/18 [phone]

## 2018-12-15 DIAGNOSIS — C4921 Malignant neoplasm of connective and soft tissue of right lower limb, including hip: Secondary | ICD-10-CM | POA: Diagnosis not present

## 2018-12-15 DIAGNOSIS — I82402 Acute embolism and thrombosis of unspecified deep veins of left lower extremity: Secondary | ICD-10-CM | POA: Diagnosis not present

## 2018-12-16 DIAGNOSIS — I82409 Acute embolism and thrombosis of unspecified deep veins of unspecified lower extremity: Secondary | ICD-10-CM | POA: Insufficient documentation

## 2018-12-16 DIAGNOSIS — I82402 Acute embolism and thrombosis of unspecified deep veins of left lower extremity: Secondary | ICD-10-CM | POA: Diagnosis not present

## 2018-12-19 ENCOUNTER — Other Ambulatory Visit (HOSPITAL_BASED_OUTPATIENT_CLINIC_OR_DEPARTMENT_OTHER): Payer: PPO

## 2018-12-31 DIAGNOSIS — R972 Elevated prostate specific antigen [PSA]: Secondary | ICD-10-CM | POA: Diagnosis not present

## 2018-12-31 DIAGNOSIS — R350 Frequency of micturition: Secondary | ICD-10-CM | POA: Diagnosis not present

## 2018-12-31 DIAGNOSIS — N401 Enlarged prostate with lower urinary tract symptoms: Secondary | ICD-10-CM | POA: Diagnosis not present

## 2019-01-14 DIAGNOSIS — I82402 Acute embolism and thrombosis of unspecified deep veins of left lower extremity: Secondary | ICD-10-CM | POA: Diagnosis not present

## 2019-01-21 ENCOUNTER — Telehealth: Payer: Self-pay | Admitting: Internal Medicine

## 2019-01-21 ENCOUNTER — Encounter: Payer: Self-pay | Admitting: Internal Medicine

## 2019-01-21 NOTE — Telephone Encounter (Signed)
Received a new hem referral for DVT from Dr. Hulan Fess. Pt has been cld and scheduled t see Dr. Walden Field on 2/13 at 950am. Letter mailed.

## 2019-02-11 ENCOUNTER — Telehealth: Payer: Self-pay | Admitting: Internal Medicine

## 2019-02-11 ENCOUNTER — Inpatient Hospital Stay: Payer: PPO | Attending: Internal Medicine | Admitting: Internal Medicine

## 2019-02-11 ENCOUNTER — Inpatient Hospital Stay: Payer: PPO

## 2019-02-11 VITALS — BP 144/86 | HR 78 | Temp 97.5°F | Resp 18 | Ht 71.0 in | Wt 244.8 lb

## 2019-02-11 DIAGNOSIS — G20 Parkinson's disease: Secondary | ICD-10-CM | POA: Insufficient documentation

## 2019-02-11 DIAGNOSIS — I82402 Acute embolism and thrombosis of unspecified deep veins of left lower extremity: Secondary | ICD-10-CM | POA: Diagnosis not present

## 2019-02-11 DIAGNOSIS — C419 Malignant neoplasm of bone and articular cartilage, unspecified: Secondary | ICD-10-CM

## 2019-02-11 DIAGNOSIS — C4921 Malignant neoplasm of connective and soft tissue of right lower limb, including hip: Secondary | ICD-10-CM

## 2019-02-11 DIAGNOSIS — I10 Essential (primary) hypertension: Secondary | ICD-10-CM | POA: Diagnosis not present

## 2019-02-11 DIAGNOSIS — Z87891 Personal history of nicotine dependence: Secondary | ICD-10-CM | POA: Diagnosis not present

## 2019-02-11 DIAGNOSIS — Z7901 Long term (current) use of anticoagulants: Secondary | ICD-10-CM | POA: Diagnosis not present

## 2019-02-11 DIAGNOSIS — I82492 Acute embolism and thrombosis of other specified deep vein of left lower extremity: Secondary | ICD-10-CM

## 2019-02-11 LAB — CBC WITH DIFFERENTIAL/PLATELET
Abs Immature Granulocytes: 0 10*3/uL (ref 0.00–0.07)
Basophils Absolute: 0 10*3/uL (ref 0.0–0.1)
Basophils Relative: 1 %
Eosinophils Absolute: 0.1 10*3/uL (ref 0.0–0.5)
Eosinophils Relative: 2 %
HCT: 40.7 % (ref 39.0–52.0)
Hemoglobin: 14 g/dL (ref 13.0–17.0)
Immature Granulocytes: 0 %
Lymphocytes Relative: 23 %
Lymphs Abs: 0.8 10*3/uL (ref 0.7–4.0)
MCH: 31.7 pg (ref 26.0–34.0)
MCHC: 34.4 g/dL (ref 30.0–36.0)
MCV: 92.3 fL (ref 80.0–100.0)
Monocytes Absolute: 0.4 10*3/uL (ref 0.1–1.0)
Monocytes Relative: 12 %
Neutro Abs: 2.2 10*3/uL (ref 1.7–7.7)
Neutrophils Relative %: 62 %
Platelets: 146 10*3/uL — ABNORMAL LOW (ref 150–400)
RBC: 4.41 MIL/uL (ref 4.22–5.81)
RDW: 13 % (ref 11.5–15.5)
WBC: 3.5 10*3/uL — ABNORMAL LOW (ref 4.0–10.5)
nRBC: 0 % (ref 0.0–0.2)

## 2019-02-11 LAB — COMPREHENSIVE METABOLIC PANEL
ALT: 9 U/L (ref 0–44)
AST: 15 U/L (ref 15–41)
Albumin: 4.1 g/dL (ref 3.5–5.0)
Alkaline Phosphatase: 50 U/L (ref 38–126)
Anion gap: 6 (ref 5–15)
BUN: 14 mg/dL (ref 8–23)
CO2: 29 mmol/L (ref 22–32)
Calcium: 9.1 mg/dL (ref 8.9–10.3)
Chloride: 102 mmol/L (ref 98–111)
Creatinine, Ser: 0.98 mg/dL (ref 0.61–1.24)
GFR calc Af Amer: >60 mL/min (ref >60)
GFR calc non Af Amer: >60 mL/min (ref >60)
Glucose, Bld: 91 mg/dL (ref 70–99)
Potassium: 3.9 mmol/L (ref 3.5–5.1)
Sodium: 137 mmol/L (ref 135–145)
Total Bilirubin: 0.9 mg/dL (ref 0.3–1.2)
Total Protein: 6.8 g/dL (ref 6.5–8.1)

## 2019-02-11 LAB — LACTATE DEHYDROGENASE: LDH: 142 U/L (ref 98–192)

## 2019-02-11 LAB — FERRITIN: Ferritin: 74 ng/mL (ref 24–336)

## 2019-02-11 NOTE — Telephone Encounter (Signed)
Scheduled appt per 02/13 los. ° °Printed calendar and avs. °

## 2019-02-11 NOTE — Progress Notes (Signed)
Referring Physician:  Dr. 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  Assessment and Plan:  1.  Left lower extremity DVT. 78 year old male referred for evaluation due to LLE DVT.  Patient has a history of liposarcoma and is followed by Dr. Redmond Pulling at [facility].  His last MRI done July 24/2019 showed interval increased size of the infiltrative liposarcoma predominantly within the medial compartment with extension into the anterior and posterior compartments.  Patient patient reports he is scheduled for surgery in March.  He reports he is active and plays golf.  Denies any recent travel.  He has a family history of a blood of a brother with a lower extremity DVT.  In December he developed left lower extremity swelling.  He denied any pain.  He had a left lower extremity Doppler done 12/10/2018 that showed IMPRESSION: The study is positive for left lower extremity DVT as described. There is partially occlusive thrombus in the femoral vein, popliteal vein, and peroneal vein. There is occlusive thrombus in the posterior tibial vein.  He has been started on Eliquis.  He reports he is tolerating  the medication without problems.  He denies any fracture history.  Last colonoscopy was reportedly 2 years ago.  Patient is seen for consultation due to left lower extremity DVT.  Patient will undergo hypercoagulable evaluation.  Due to the history of liposarcoma he will also undergo a CT chest abdomen pelvis for any evidence of malignancy as a possible etiology for his DVT.  He is currently recommended for 6 months of anticoagulation.  I discussed with him if he has plan for surgery he would likely be recommended for Lovenox bridging.  Patient will return to clinic to go over lab and scan results.  All questions answered and he expressed understanding of the information presented.  2.  Liposarcoma.  This was affecting the right lower extremity.  Patient had an MRI done July 22, 2018 that showed interval increase size of the infiltrative liposarcoma primarily in the medial compartment with extension into the anterior and posterior compartments.  He is followed by Dr. Redmond Pulling at [facility].  He reports he is planned for additional surgery in March.  He should follow-up with Dr. Redmond Pulling as recommended.  3.  Parkinson's.  Follow-up with PCP or neurology as recommended.  4.  Hypertension.  Blood pressure is 144/86.  Follow-up with PCP.  5.  Health maintenance.  Patient reportedly had a colonoscopy 2 years ago.  He should follow-up with GI as recommended.  40 minutes spent with more than 50% spent in review of records, counseling and coordination of care.  HPI:  78 year old male referred for evaluation due to LLE DVT.  Patient has a history of liposarcoma and is followed by Dr. Redmond Pulling at [facility].  His last MRI done July 24/2019 showed interval increased size of  the infiltrative liposarcoma predominantly within the medial compartment with extension into the anterior and posterior compartments.  Patient patient reports he is scheduled for surgery in March.  He reports he is active and plays golf.  Denies  any recent travel.  He has a family history of a blood of a brother with a lower extremity DVT.  In December he developed left lower extremity swelling.  He denied any pain.  He had a left lower extremity Doppler done 12/10/2018 that showed IMPRESSION: The study is positive for left lower extremity DVT as described. There is partially occlusive thrombus in the femoral vein, popliteal vein, and peroneal vein. There is occlusive thrombus in the posterior tibial vein.  He has been started on Eliquis.  He reports he is tolerating the medication without problems.  He denies any fracture history.  Last colonoscopy was reportedly 2 years ago.  Patient is seen for consultation due to left lower extremity DVT.  Problem List Patient Active Problem List   Diagnosis Date Noted  . Parkinson disease ([place]) [G20] 12/03/2016    Past Medical History Past Medical History:  Diagnosis Date  . High blood pressure   . Parkinson disease ([place]) 12/03/2016    Past Surgical History Past Surgical History:  Procedure Laterality Date  . CERVICAL DISC SURGERY  2007   Ruptured disc, plates and screws  . COLONOSCOPY  2008  . 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 Leonides Schanz, [facility]    Family History Family History  Problem Relation Age of Onset  . High blood pressure Mother   . Anxiety disorder Mother   . High blood pressure Father   . Heart attack Father      Social History  reports that he has quit smoking. He has never used smokeless tobacco. He reports current alcohol use of about 3.0 standard drinks of alcohol per week. He reports that he does not use drugs.  Medications  Current Outpatient Medications:  .  Azelastine HCl 0.15 % SOLN, , Disp: , Rfl:  .  Carbidopa-Levodopa ER (SINEMET CR) 25-100 MG tablet controlled release, Take 0.5 tablets by mouth 3 (three) times daily., Disp: 135 tablet, Rfl: 1 .  Cholecalciferol (VITAMIN D-1000 MAX ST) 1000 units tablet, Take by  mouth., Disp: , Rfl:  .  dutasteride (AVODART) 0.5 MG capsule, , Disp: , Rfl:  .  ELIQUIS STARTER PACK (ELIQUIS STARTER PACK) 5 MG TABS, Take as directed on package: start with two-5mg  tablets twice daily for 7 days. On day 8, switch to one-5mg  tablet twice daily., Disp: 1 each, Rfl: 0 .  fluticasone (FLONASE) 50 MCG/ACT nasal spray, , Disp: , Rfl:  .  losartan-hydrochlorothiazide (HYZAAR) 100-12.5 MG tablet, Take by mouth., Disp: , Rfl:  .  pramipexole (MIRAPEX) 1 MG tablet, TAKE 1 TABLET (1 MG TOTAL) BY MOUTH 3 (THREE) TIMES DAILY., Disp: 270 tablet, Rfl: 1 .  propranolol (INDERAL) 10 MG tablet, Take 1 tablet (10 mg total) by mouth 3 (three) times daily as needed., Disp: 30 tablet, Rfl: 1 .  terazosin (HYTRIN) 10 MG capsule, Take 10 mg by mouth., Disp: , Rfl:   Allergies Sulfamethoxazole  Review of Systems Review of Systems - Oncology ROS negative   Physical Exam  Vitals Wt Readings from Last 3 Encounters:  02/11/19 244 lb 12.8 oz (111 kg)  12/10/18 221 lb (100.2 kg)  09/18/18 228 lb (103.4 kg)   Temp Readings from Last 3 Encounters:  02/11/19 (!) 97.5  F (36.4 C) (Oral)  12/10/18 98.4 F (36.9 C) (Oral)   BP Readings from Last 3 Encounters:  02/11/19 (!) 144/86  12/10/18 (!) 137/95  09/18/18 136/87   Pulse Readings from Last 3 Encounters:  02/11/19 78  12/10/18 75  09/18/18 64   Constitutional: Well-developed, well-nourished, and in no distress.   HENT: Head: Normocephalic and atraumatic.  Mouth/Throat: No oropharyngeal exudate. Mucosa moist. Eyes: Pupils are equal, round, and reactive to light. Conjunctivae are normal. No scleral icterus.  Neck: Normal range of motion. Neck supple. No JVD present.  Cardiovascular: Normal rate, regular rhythm and normal heart sounds.  Exam reveals no gallop and no friction rub.   No murmur heard. Pulmonary/Chest: Effort normal and breath sounds normal. No respiratory distress. No wheezes.No rales.  Abdominal: Soft. Bowel sounds are  normal. No distension. There is no tenderness. There is no guarding.  Musculoskeletal: Evidence of prior right LE surgery.  Trace edema bilaterally.   Lymphadenopathy: No cervical,axillary or supraclavicular adenopathy.  Neurological: Alert and oriented to person, place, and time. No cranial nerve deficit. Parkinson history.   Skin: Skin is warm and dry. No rash noted. No erythema. No pallor.  Psychiatric: Affect and judgment normal.   Labs Appointment on 02/11/2019  Component Date Value Ref Range Status  . LDH 02/11/2019 142  98 - 192 U/L Final   Performed at [facility], [place] [address], [place], [place] [address]  . Sodium 02/11/2019 137  135 - 145 mmol/L Final  . Potassium 02/11/2019 3.9  3.5 - 5.1 mmol/L Final  . Chloride 02/11/2019 102  98 - 111 mmol/L Final  . CO2 02/11/2019 29  22 - 32 mmol/L Final  . Glucose, Bld 02/11/2019 91  70 - 99 mg/dL Final  . BUN 02/11/2019 14  8 - 23 mg/dL Final  . Creatinine, Ser 02/11/2019 0.98  0.61 - 1.24 mg/dL Final  . Calcium 02/11/2019 9.1  8.9 - 10.3 mg/dL Final  . Total Protein 02/11/2019 6.8  6.5 - 8.1 g/dL Final  . Albumin 02/11/2019 4.1  3.5 - 5.0 g/dL Final  . AST 02/11/2019 15  15 - 41 U/L Final  . ALT 02/11/2019 9  0 - 44 U/L Final  . Alkaline Phosphatase 02/11/2019 50  38 - 126 U/L Final  . Total Bilirubin 02/11/2019 0.9  0.3 - 1.2 mg/dL Final  . GFR calc non Af Amer 02/11/2019 >60  >60 mL/min Final  . GFR calc Af Amer 02/11/2019 >60  >60 mL/min Final  . Anion gap 02/11/2019 6  5 - 15 Final   Performed at [facility], [place] [address], [place], [place] [address]  . WBC 02/11/2019 3.5* 4.0 - 10.5 K/uL Final  . RBC 02/11/2019 4.41  4.22 - 5.81 MIL/uL Final  . Hemoglobin 02/11/2019 14.0  13.0 - 17.0 g/dL Final  . HCT 02/11/2019 40.7  39.0 - 52.0 % Final  . MCV 02/11/2019 92.3  80.0 - 100.0 fL Final  . MCH 02/11/2019 31.7  26.0 - 34.0 pg Final  . MCHC 02/11/2019 34.4  30.0 - 36.0 g/dL  Final  . RDW 02/11/2019 13.0  11.5 - 15.5 % Final  . Platelets 02/11/2019 146* 150 - 400 K/uL Final  . nRBC 02/11/2019 0.0  0.0 - 0.2 % Final  . Neutrophils Relative % 02/11/2019 62  % Final  . Neutro Abs 02/11/2019 2.2  1.7 - 7.7 K/uL Final  . Lymphocytes Relative 02/11/2019 23  % Final  .  Lymphs Abs 02/11/2019 0.8  0.7 - 4.0 K/uL Final  . Monocytes Relative 02/11/2019 12  % Final  . Monocytes Absolute 02/11/2019 0.4  0.1 - 1.0 K/uL Final  . Eosinophils Relative 02/11/2019 2  % Final  . Eosinophils Absolute 02/11/2019 0.1  0.0 - 0.5 K/uL Final  . Basophils Relative 02/11/2019 1  % Final  . Basophils Absolute 02/11/2019 0.0  0.0 - 0.1 K/uL Final  . Immature Granulocytes 02/11/2019 0  % Final  . Abs Immature Granulocytes 02/11/2019 0.00  0.00 - 0.07 K/uL Final   Performed at [facility], [place] [address], [place], [place] [address]     Pathology Orders Placed This Encounter  Procedures  . CT CHEST W CONTRAST    Standing Status:   Future    Standing Expiration Date:   02/11/2020    Order Specific Question:   If indicated for the ordered procedure, I authorize the administration of contrast media per Radiology protocol    Answer:   Yes    Order Specific Question:   Preferred imaging location?    Answer:   [facility]    Order Specific Question:   Radiology Contrast Protocol - do NOT remove file path    Answer:   \\[email or web address]  . CT ABDOMEN PELVIS W CONTRAST    Standing Status:   Future    Standing Expiration Date:   02/11/2020    Order Specific Question:   If indicated for the ordered procedure, I authorize the administration of contrast media per Radiology protocol    Answer:   Yes    Order Specific Question:   Preferred imaging location?    Answer:   [facility]    Order Specific Question:   Is Oral Contrast requested for this exam?    Answer:   Yes, Per Radiology protocol    Order Specific Question:    Radiology Contrast Protocol - do NOT remove file path    Answer:   \\[email or web address]  . Factor 5 leiden    Standing Status:   Future    Number of Occurrences:   1    Standing Expiration Date:   02/12/2020  . Prothrombin gene mutation    Standing Status:   Future    Number of Occurrences:   1    Standing Expiration Date:   02/12/2020  . Beta-2-glycoprotein i abs, IgG/M/A    Standing Status:   Future    Number of Occurrences:   1    Standing Expiration Date:   02/12/2020  . Lupus anticoagulant panel    Standing Status:   Future    Number of Occurrences:   1    Standing Expiration Date:   02/12/2020  . ANA, IFA (with reflex)    Standing Status:   Future    Number of Occurrences:   1    Standing Expiration Date:   02/12/2020  . CBC with Differential/Platelet    Standing Status:   Future    Number of Occurrences:   1    Standing Expiration Date:   02/12/2020  . Comprehensive metabolic panel    Standing Status:   Future    Number of Occurrences:   1    Standing Expiration Date:   02/12/2020  . Lactate dehydrogenase    Standing Status:   Future    Number of Occurrences:   1    Standing Expiration Date:   02/12/2020  . Protein electrophoresis, serum  Standing Status:   Future    Number of Occurrences:   1    Standing Expiration Date:   02/12/2020  . Ferritin    Standing Status:   Future    Number of Occurrences:   1    Standing Expiration Date:   02/12/2020       Zoila Shutter MD

## 2019-02-12 LAB — PROTEIN ELECTROPHORESIS, SERUM
A/G Ratio: 1.5 (ref 0.7–1.7)
Albumin ELP: 3.8 g/dL (ref 2.9–4.4)
Alpha-1-Globulin: 0.2 g/dL (ref 0.0–0.4)
Alpha-2-Globulin: 0.5 g/dL (ref 0.4–1.0)
Beta Globulin: 0.8 g/dL (ref 0.7–1.3)
Gamma Globulin: 1 g/dL (ref 0.4–1.8)
Globulin, Total: 2.5 g/dL (ref 2.2–3.9)
Total Protein ELP: 6.3 g/dL (ref 6.0–8.5)

## 2019-02-12 LAB — ANTINUCLEAR ANTIBODIES, IFA: ANA Ab, IFA: NEGATIVE

## 2019-02-13 LAB — DRVVT MIX: dRVVT Mix: 45.7 s (ref 0.0–47.0)

## 2019-02-13 LAB — LUPUS ANTICOAGULANT PANEL
DRVVT: 51.7 s — ABNORMAL HIGH (ref 0.0–47.0)
PTT Lupus Anticoagulant: 35.3 s (ref 0.0–51.9)

## 2019-02-14 ENCOUNTER — Other Ambulatory Visit: Payer: Self-pay | Admitting: Neurology

## 2019-02-14 LAB — BETA-2-GLYCOPROTEIN I ABS, IGG/M/A
Beta-2 Glyco I IgG: <9 GPI IgG units (ref 0–20)
Beta-2-Glycoprotein I IgA: <9 GPI IgA units (ref 0–25)
Beta-2-Glycoprotein I IgM: <9 GPI IgM units (ref 0–32)

## 2019-02-16 LAB — FACTOR 5 LEIDEN

## 2019-02-22 LAB — PROTHROMBIN GENE MUTATION

## 2019-02-23 ENCOUNTER — Ambulatory Visit (HOSPITAL_COMMUNITY)
Admission: RE | Admit: 2019-02-23 | Discharge: 2019-02-23 | Disposition: A | Discharge status: Home / Self Care | Payer: PPO | Source: Ambulatory Visit | Attending: Internal Medicine | Admitting: Internal Medicine

## 2019-02-23 DIAGNOSIS — I700 Atherosclerosis of aorta: Secondary | ICD-10-CM | POA: Diagnosis not present

## 2019-02-23 DIAGNOSIS — C419 Malignant neoplasm of bone and articular cartilage, unspecified: Secondary | ICD-10-CM | POA: Diagnosis not present

## 2019-02-23 DIAGNOSIS — N400 Enlarged prostate without lower urinary tract symptoms: Secondary | ICD-10-CM | POA: Insufficient documentation

## 2019-02-23 DIAGNOSIS — K7689 Other specified diseases of liver: Secondary | ICD-10-CM | POA: Diagnosis not present

## 2019-02-23 DIAGNOSIS — J8410 Pulmonary fibrosis, unspecified: Secondary | ICD-10-CM | POA: Diagnosis not present

## 2019-02-23 DIAGNOSIS — R911 Solitary pulmonary nodule: Secondary | ICD-10-CM | POA: Diagnosis not present

## 2019-02-23 MED ORDER — IOHEXOL 300 MG/ML  SOLN
100.0000 mL | Freq: Once | INTRAMUSCULAR | Status: AC | PRN
  Administered 2019-02-23: 100 mL via INTRAVENOUS

## 2019-02-23 MED ORDER — SODIUM CHLORIDE (PF) 0.9 % IJ SOLN
INTRAMUSCULAR | Status: AC
  Filled 2019-02-23: qty 50

## 2019-03-02 ENCOUNTER — Telehealth: Payer: Self-pay | Admitting: Neurology

## 2019-03-02 MED ORDER — CARBIDOPA-LEVODOPA ER 25-100 MG PO TBCR: 0.5000 | EXTENDED_RELEASE_TABLET | Freq: Three times a day (TID) | ORAL | 1 refills | Status: DC

## 2019-03-02 NOTE — Telephone Encounter (Signed)
Chart reviewed. Rx was ordinally send on 02/15/19 for a 90 day supply with 1 refill to [facility]. Pt states the pharmacy never received. I advised we would resend. Pt voiced appreciation and was advised to call back if he had any further issues picking the medication up.

## 2019-03-02 NOTE — Telephone Encounter (Signed)
Patient is calling in wanting a refill of Carbidopa-Levodopa ER (SINEMET CR) 25-100 MG tablet controlled release  [id number] [place], [place] (Phone) [phone] (Fax)

## 2019-03-04 ENCOUNTER — Inpatient Hospital Stay: Payer: PPO | Attending: Internal Medicine | Admitting: Internal Medicine

## 2019-03-04 ENCOUNTER — Other Ambulatory Visit: Payer: Self-pay

## 2019-03-04 ENCOUNTER — Telehealth: Payer: Self-pay | Admitting: Internal Medicine

## 2019-03-04 VITALS — BP 109/74 | HR 75 | Temp 98.4°F | Resp 17 | Ht 71.0 in | Wt 226.3 lb

## 2019-03-04 DIAGNOSIS — G20 Parkinson's disease: Secondary | ICD-10-CM

## 2019-03-04 DIAGNOSIS — Z7901 Long term (current) use of anticoagulants: Secondary | ICD-10-CM | POA: Diagnosis not present

## 2019-03-04 DIAGNOSIS — I10 Essential (primary) hypertension: Secondary | ICD-10-CM | POA: Diagnosis not present

## 2019-03-04 DIAGNOSIS — I82492 Acute embolism and thrombosis of other specified deep vein of left lower extremity: Secondary | ICD-10-CM | POA: Insufficient documentation

## 2019-03-04 DIAGNOSIS — I82402 Acute embolism and thrombosis of unspecified deep veins of left lower extremity: Secondary | ICD-10-CM | POA: Diagnosis present

## 2019-03-04 DIAGNOSIS — R918 Other nonspecific abnormal finding of lung field: Secondary | ICD-10-CM | POA: Diagnosis not present

## 2019-03-04 NOTE — Progress Notes (Signed)
Diagnosis Acute deep vein thrombosis (DVT) of other specified vein of left lower extremity ([place]) - Plan: CBC with Differential ([place] Only), CMP ([place] only), Lactate dehydrogenase (LDH), VAS [place] LOWER EXTREMITY VENOUS (DVT)  Staging Cancer Staging No matching staging information was found for the patient.  Assessment and Plan:   1.  Left lower extremity DVT. 78 year old male referred for evaluation due to LLE DVT.  Patient has a history of liposarcoma and is followed by Dr. Redmond Pulling at [facility].  His last MRI done July 24/2019 showed interval increased size of the infiltrative liposarcoma predominantly within the medial compartment with extension into the anterior and posterior compartments.  Patient patient reports he is scheduled for surgery in March.  He reports he is active and plays golf.  Denies any recent travel.  He has a family history of a blood of a brother with a lower extremity DVT.  In December he developed left lower extremity swelling.  He denied any pain.  He had a left lower extremity Doppler done 12/10/2018 that showed IMPRESSION: The study is positive for left lower extremity DVT as described. There is partially occlusive thrombus in the femoral vein, popliteal vein, and peroneal vein. There is occlusive thrombus in the posterior tibial vein.  He has been started on Eliquis.  He reports he is tolerating the medication without problems.  He denies any fracture history.  Last colonoscopy was reportedly 2 years ago.    Hypercoagulable evaluation shows negative Factor V leiden, PT gene mutation, Lupus anticoagulant.  He has normal B2 Glycoprotein ab.  .  Due to the history of liposarcoma CT chest abdomen pelvis for any evidence of advanced malignancy as a possible etiology for his DVT.    CT CAP done 02/23/2019 reviewed and showed  IMPRESSION: 1. No lymphadenopathy or left pelvic sidewall mass in this patient with known left lower extremity DVT. 2. Venous  opacification in the pelvis is suboptimal, but there is no enlargement of the left external iliac vein are left common femoral vein. No perivascular edema as typically seen in the setting of acute DVT. 3. No evidence for metastatic disease in the chest, abdomen, or pelvis. 4. 3 mm noncalcified left upper lobe pulmonary nodule. Continued attention on follow-up recommended. 5.  Aortic Atherosclerois (ICD10-170.0) 6. Prostatomegaly. 7. Incompletely visualized fat soft tissue density mass in the medial right thigh consistent with the patient's known liposarcoma.  He is currently recommended for 6 months of anticoagulation.  I discussed with him if he has plan for surgery he would  be recommended for Lovenox bridging.  Eliquis can be stopped 2-3 days before surgery.  Pt should be started on Lovenox postoperatively until pt is able to resume Eliquis therapy at same dose.  Usually recommended to not resume Eliquis until hemostasis achieved or 2-3 days postoperatively.     Pt will be set up for bilateral LE doppler in 06/2019 and will follow-up with labs at that time.  All questions answered and he expressed understanding of the information presented.  2.  Liposarcoma.  This was affecting the right lower extremity.  Patient had an MRI done July 22, 2018 that showed interval increase size of the infiltrative liposarcoma primarily in the medial compartment with extension into the anterior and posterior compartments.  He is followed by Dr. Redmond Pulling at [facility].  He reports he is planned for additional surgery in March.  He should follow-up with Dr. Redmond Pulling as recommended.  3.  Parkinson's.  Follow-up with PCP or neurology as recommended.  4.  Hypertension.  Blood pressure is 109/74.  Follow-up with PCP.  5.  Left upper lobe Pulmonary nodule.  This is nonspecific measures 3 mm.  Pt will be set up for repeat imaging 6 months to 1 year.  Denies smoking.    6.  Health maintenance.  Patient reportedly had a  colonoscopy 2 years ago.  He should follow-up with GI as recommended.  25 minutes spent with more than 50% spent in review of records, counseling and coordination of care.  Interval History:  Historical data obtained from note dated 02/11/2019:  78 year old male referred for evaluation due to LLE DVT.  Patient has a history of liposarcoma and is followed by Dr. Redmond Pulling at [facility].  His last MRI done July 24/2019 showed interval increased size of the infiltrative liposarcoma predominantly within the medial compartment with extension into the anterior and posterior compartments.  Patient patient reports he is scheduled for surgery in March.  He reports he is active and plays golf.  Denies any recent travel.  He has a family history of a blood of a brother with a lower extremity DVT.  In December he developed left lower extremity swelling.  He denied any pain.  He had a left lower extremity Doppler done 12/10/2018 that showed IMPRESSION: The study is positive for left lower extremity DVT as described. There is partially occlusive thrombus in the femoral vein, popliteal vein, and peroneal vein. There is occlusive thrombus in the posterior tibial vein.  He has been started on Eliquis.  He reports he is tolerating the medication without problems.  He denies any fracture history.  Last colonoscopy was reportedly 2 years ago.   Current Status:  Pt is seen today to go over labs and scans.  He is scheduled to see Dr. Redmond Pulling on 03/16/2019.    Problem List Patient Active Problem List   Diagnosis Date Noted  . Parkinson disease ([place]) [G20] 12/03/2016    Past Medical History Past Medical History:  Diagnosis Date  . High blood pressure   . Parkinson disease ([place]) 12/03/2016    Past Surgical History Past Surgical History:  Procedure Laterality Date  . CERVICAL DISC SURGERY  2007   Ruptured disc, plates and screws  . COLONOSCOPY  2008  . 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  Leonides Schanz, [facility]    Family History Family History  Problem Relation Age of Onset  . High blood pressure Mother   . Anxiety disorder Mother   . High blood pressure Father   . Heart attack Father      Social History  reports that he has quit smoking. He has never used smokeless tobacco. He reports current alcohol use of about 3.0 standard drinks of alcohol per week. He reports that he does not use drugs.  Medications  Current Outpatient Medications:  .  Azelastine HCl 0.15 % SOLN, , Disp: , Rfl:  .  Carbidopa-Levodopa ER (SINEMET CR) 25-100 MG tablet controlled release, Take 0.5 tablets by mouth 3 (three) times daily., Disp: 135 tablet, Rfl: 1 .  Cholecalciferol (VITAMIN D-1000 MAX ST) 1000 units tablet, Take by mouth., Disp: , Rfl:  .  dutasteride (AVODART) 0.5 MG capsule, , Disp: , Rfl:  .  ELIQUIS STARTER PACK (ELIQUIS STARTER PACK) 5 MG TABS, Take as directed on package: start with two-5mg  tablets twice daily for 7 days. On day 8, switch to  one-5mg  tablet twice daily., Disp: 1 each, Rfl: 0 .  fluticasone (FLONASE) 50 MCG/ACT nasal spray, , Disp: , Rfl:  .  losartan-hydrochlorothiazide (HYZAAR) 100-12.5 MG tablet, Take by mouth., Disp: , Rfl:  .  pramipexole (MIRAPEX) 1 MG tablet, TAKE 1 TABLET (1 MG TOTAL) BY MOUTH 3 (THREE) TIMES DAILY., Disp: 270 tablet, Rfl: 1 .  propranolol (INDERAL) 10 MG tablet, Take 1 tablet (10 mg total) by mouth 3 (three) times daily as needed., Disp: 30 tablet, Rfl: 1 .  terazosin (HYTRIN) 10 MG capsule, Take 10 mg by mouth., Disp: , Rfl:   Allergies Sulfamethoxazole  Review of Systems Review of Systems - Oncology ROS negative   Physical Exam  Vitals Wt Readings from Last 3 Encounters:  03/04/19 226 lb 4.8 oz (102.6 kg)  02/11/19 244 lb 12.8 oz (111 kg)  12/10/18 221 lb (100.2 kg)   Temp Readings from Last 3 Encounters:  03/04/19 98.4 F (36.9 C) (Oral)  02/11/19 (!) 97.5 F (36.4 C) (Oral)  12/10/18 98.4 F (36.9 C) (Oral)   BP  Readings from Last 3 Encounters:  03/04/19 109/74  02/11/19 (!) 144/86  12/10/18 (!) 137/95   Pulse Readings from Last 3 Encounters:  03/04/19 75  02/11/19 78  12/10/18 75    Constitutional: Well-developed, well-nourished, and in no distress.  Parkinsonian features HENT: Head: Normocephalic and atraumatic.  Mouth/Throat: No oropharyngeal exudate. Mucosa moist. Eyes: Pupils are equal, round, and reactive to light. Conjunctivae are normal. No scleral icterus.  Neck: Normal range of motion. Neck supple. No JVD present.  Cardiovascular: Normal rate, regular rhythm and normal heart sounds.  Exam reveals no gallop and no friction rub.   No murmur heard. Pulmonary/Chest: Effort normal and breath sounds normal. No respiratory distress. No wheezes.No rales.  Abdominal: Soft. Bowel sounds are normal. No distension. There is no tenderness. There is no guarding.  Musculoskeletal: No edema or tenderness. Evidence of prior right LE surgery.   Lymphadenopathy: No cervical, axillary or supraclavicular adenopathy.  Neurological: Parkinsonian features Skin: Skin is warm and dry. No rash noted. No erythema. No pallor.  Psychiatric: Affect and judgment normal.   Labs No visits with results within 3 Day(s) from this visit.  Latest known visit with results is:  Clinical Support on 02/11/2019  Component Date Value Ref Range Status  . Ferritin 02/11/2019 74  24 - 336 ng/mL Final   Performed at [facility], [place] [address], [place], [place] [address]  . Total Protein ELP 02/11/2019 6.3  6.0 - 8.5 g/dL Final  . Albumin ELP 02/11/2019 3.8  2.9 - 4.4 g/dL Final  . Alpha-1-Globulin 02/11/2019 0.2  0.0 - 0.4 g/dL Final  . Alpha-2-Globulin 02/11/2019 0.5  0.4 - 1.0 g/dL Final  . Beta Globulin 02/11/2019 0.8  0.7 - 1.3 g/dL Final  . Gamma Globulin 02/11/2019 1.0  0.4 - 1.8 g/dL Final  . M-Spike, % 02/11/2019 Not Observed  Not Observed g/dL Final  . SPE Interp. 02/11/2019 Comment    Final   Comment: (NOTE) The SPE pattern appears essentially unremarkable. Evidence of monoclonal protein is not apparent. Performed At: [facility] [place], [place] [id number] Rush Farmer MD HW:[id number]   . Comment 02/11/2019 Comment   Final   Comment: (NOTE) Protein electrophoresis scan will follow via computer, mail, or courier delivery.   . Globulin, Total 02/11/2019 2.5  2.2 - 3.9 g/dL Corrected  . A/G Ratio 02/11/2019 1.5  0.7 - 1.7 Corrected  .  LDH 02/11/2019 142  98 - 192 U/L Final   Performed at [facility], [place] [address], [place], [place] [address]  . Sodium 02/11/2019 137  135 - 145 mmol/L Final  . Potassium 02/11/2019 3.9  3.5 - 5.1 mmol/L Final  . Chloride 02/11/2019 102  98 - 111 mmol/L Final  . CO2 02/11/2019 29  22 - 32 mmol/L Final  . Glucose, Bld 02/11/2019 91  70 - 99 mg/dL Final  . BUN 02/11/2019 14  8 - 23 mg/dL Final  . Creatinine, Ser 02/11/2019 0.98  0.61 - 1.24 mg/dL Final  . Calcium 02/11/2019 9.1  8.9 - 10.3 mg/dL Final  . Total Protein 02/11/2019 6.8  6.5 - 8.1 g/dL Final  . Albumin 02/11/2019 4.1  3.5 - 5.0 g/dL Final  . AST 02/11/2019 15  15 - 41 U/L Final  . ALT 02/11/2019 9  0 - 44 U/L Final  . Alkaline Phosphatase 02/11/2019 50  38 - 126 U/L Final  . Total Bilirubin 02/11/2019 0.9  0.3 - 1.2 mg/dL Final  . GFR calc non Af Amer 02/11/2019 >60  >60 mL/min Final  . GFR calc Af Amer 02/11/2019 >60  >60 mL/min Final  . Anion gap 02/11/2019 6  5 - 15 Final   Performed at [facility], [place] [address] [place], [place] [address]  . WBC 02/11/2019 3.5* 4.0 - 10.5 K/uL Final  . RBC 02/11/2019 4.41  4.22 - 5.81 MIL/uL Final  . Hemoglobin 02/11/2019 14.0  13.0 - 17.0 g/dL Final  . HCT 02/11/2019 40.7  39.0 - 52.0 % Final  . MCV 02/11/2019 92.3  80.0 - 100.0 fL Final  . MCH 02/11/2019 31.7  26.0 - 34.0 pg Final  . MCHC 02/11/2019 34.4  30.0 - 36.0 g/dL Final  . RDW 02/11/2019  13.0  11.5 - 15.5 % Final  . Platelets 02/11/2019 146* 150 - 400 K/uL Final  . nRBC 02/11/2019 0.0  0.0 - 0.2 % Final  . Neutrophils Relative % 02/11/2019 62  % Final  . Neutro Abs 02/11/2019 2.2  1.7 - 7.7 K/uL Final  . Lymphocytes Relative 02/11/2019 23  % Final  . Lymphs Abs 02/11/2019 0.8  0.7 - 4.0 K/uL Final  . Monocytes Relative 02/11/2019 12  % Final  . Monocytes Absolute 02/11/2019 0.4  0.1 - 1.0 K/uL Final  . Eosinophils Relative 02/11/2019 2  % Final  . Eosinophils Absolute 02/11/2019 0.1  0.0 - 0.5 K/uL Final  . Basophils Relative 02/11/2019 1  % Final  . Basophils Absolute 02/11/2019 0.0  0.0 - 0.1 K/uL Final  . Immature Granulocytes 02/11/2019 0  % Final  . Abs Immature Granulocytes 02/11/2019 0.00  0.00 - 0.07 K/uL Final   Performed at [facility], [place] [address] [place], [place] [address]  . ANA Ab, IFA 02/11/2019 Negative   Final   Comment: (NOTE)                                     Negative   <1:80                                     Borderline  1:80  Positive   >1:80 Performed At: [facility] [place], [place] [id number] Rush Farmer MD [id number]   . PTT Lupus Anticoagulant 02/11/2019 35.3  0.0 - 51.9 sec Final  . DRVVT 02/11/2019 51.7* 0.0 - 47.0 sec Final  . Lupus Anticoag Interp 02/11/2019 Comment:   Corrected   Comment: (NOTE) No lupus anticoagulant was detected. An extended dRVVT that corrects on mixing with normal plasma can be caused by a deficiency of one of the common pathway factors (X, V, II or fibrinogen). Performed At: [facility] [place], [place] [id number] Rush Farmer MD DX:[id number]   . Beta-2 Glyco I IgG 02/11/2019 <9  0 - 20 GPI IgG units Final   Comment: (NOTE) The reference interval reflects a 3SD or 99th percentile interval, which is thought to represent a potentially clinically significant result in accordance with the  International Consensus Statement on the classification criteria for definitive antiphospholipid syndrome (APS). J Thromb Haem 2006;4:295-306.   . Beta-2-Glycoprotein I IgM 02/11/2019 <9  0 - 32 GPI IgM units Final   Comment: (NOTE) The reference interval reflects a 3SD or 99th percentile interval, which is thought to represent a potentially clinically significant result in accordance with the International Consensus Statement on the classification criteria for definitive antiphospholipid syndrome (APS). J Thromb Haem 2006;4:295-306. Performed At: [facility] [place], [place] [id number] Rush Farmer MD EZ:[id number]   . Beta-2-Glycoprotein I IgA 02/11/2019 <9  0 - 25 GPI IgA units Final   Comment: (NOTE) The reference interval reflects a 3SD or 99th percentile interval, which is thought to represent a potentially clinically significant result in accordance with the International Consensus Statement on the classification criteria for definitive antiphospholipid syndrome (APS). J Thromb Haem 2006;4:295-306.   Marland Kitchen Recommendations-PTGENE: 02/11/2019 Comment   Final   Comment: (NOTE) NEGATIVE No mutation identified. Comment: A point mutation (G20210A) in the factor II (prothrombin) gene is the second most common cause of inherited thrombophilia. The incidence of this mutation in the [place] Caucasian population is about 2% and in the [place] American population it is approximately 0.5%. This mutation is rare in the [place] and Native American population. Being heterozygous for a prothrombin mutation increases the risk for developing venous thrombosis about 2 to 3 times above the general population risk. Being homozygous for the prothrombin gene mutation increases the relative risk for venous thrombosis further, although it is not yet known how much further the risk is increased. In women heterozygous for the prothrombin gene mutation, the use of  estrogen containing oral contraceptives increases the relative risk of venous thrombosis about 16 times and the risk of developing cerebral thrombosis is also significantly increased. In pregnancy the pr                          othrombin gene mutation increases risk for venous thrombosis and may increase risk for stillbirth, placental abruption, pre-eclampsia and fetal growth restriction. If the patient possesses two or more congenital or acquired thrombophilic risk factors, the risk for thrombosis may rise to more than the sum of the risk ratios for the individual mutations. This assay detects only the prothrombin G20210A mutation and does not measure genetic abnormalities elsewhere in the genome. Other thrombotic risk factors may be pursued through systematic clinical laboratory analysis. These factors include the R506Q (Leiden) mutation in the Factor V gene, plasma homocysteine levels, as well as testing for deficiencies of antithrombin III, protein C and  protein S. Genetic Counselors are available for health care providers to discuss results at [phone] [phone]). Methodology: DNA analysis of the Factor II gene was performed by PCR amplification followed by restriction analysis. The di                          agnostic sensitivity is >99% for both. All the tests must be combined with clinical information for the most accurate interpretation. Molecular-based testing is highly accurate, but as in any laboratory test, diagnostic errors may occur. This test was developed and its performance characteristics determined by [facility]. It has not been cleared or approved by the Food and Drug Administration. Poort SR, et al. Blood. 1996; 74:1287-8676. Varga EA. Circulation. 2004; 720:N47-S96. Mervin Hack, et [place]; 19:700-703. Allison Quarry, PhD, [facility] Ruben Reason, PhD, [facility] Annetta Maw, M.S., PhD, [facility] Alfredo Bach, PhD,  [facility] Norva Riffle, PhD, [facility] Earlean Polka, PhD, [facility] Performed At: [facility] [address] [place], [place] [id number] Nechama Guard MD [id number]   . Recommendations-F5LEID: 02/11/2019 Comment   Final   Comment: (NOTE) Result:  Negative (no mutation found) Factor V Leiden is a specific mutation (R506Q) in the factor V gene that is associated with an increased risk of venous thrombosis. Factor V Leiden is more resistant to inactivation by activated protein C.  As a result, factor V persists in the circulation leading to a mild hyper- coagulable state.  The Leiden mutation accounts for 90% - 95% of APC resistance.  Factor V Leiden has been reported in patients with deep vein thrombosis, pulmonary embolus, central retinal vein occlusion, cerebral sinus thrombosis and hepatic vein thrombosis. Other risk factors to be considered in the workup for venous thrombosis include the G20210A mutation in the factor II (prothrombin) gene, protein S and C deficiency, and antithrombin deficiencies. Anticardiolipin antibody and lupus anticoagulant analysis may be appropriate for certain patients, as well as homocysteine levels. Contact your local [facility] for information on how to order additi                          onal testing if desired. **Genetic counselors are available for health care providers to**  discuss results at [phone]). Methodology: DNA analysis of the Factor V gene was performed by allele-specific PCR. The diagnostic sensitivity and specificity is >99% for both. Molecular-based testing is highly accurate, but as in any laboratory test, diagnostic errors may occur. All test results must be combined with clinical information for the most accurate interpretation. This test was developed and its performance characteristics determined by [facility]. It has not been cleared or approved by the Food and Drug Administration. References: Voelkerding K  (1996).  Clin Lab Med [phone]. Allison Quarry, PhD, [facility] Ruben Reason, PhD, [facility] Annetta Maw, M.S., PhD, [facility] Alfredo Bach, PhD, [facility] Norva Riffle, PhD, [facility] Earlean Polka PhD, [facility] Performed At: [facility] [address] [place], [place] [id number] Nechama Guard MD [id number]                          7   . dRVVT Mix 02/11/2019 45.7  0.0 - 47.0 sec Final   Comment: (NOTE) Performed At: [facility] [place], [place] [id number] Rush Farmer MD ES:[id number]      Pathology Orders Placed This Encounter  Procedures  . CBC with  Differential (Cancer Center Only)    Standing Status:   Future    Standing Expiration Date:   03/03/2020  . CMP ([place] only)    Standing Status:   Future    Standing Expiration Date:   03/03/2020  . Lactate dehydrogenase (LDH)    Standing Status:   Future    Standing Expiration Date:   03/03/2020       Zoila Shutter MD

## 2019-03-04 NOTE — Telephone Encounter (Signed)
Gave avs and calendar ° °

## 2019-04-09 ENCOUNTER — Ambulatory Visit: Payer: PPO | Admitting: Neurology

## 2019-04-19 ENCOUNTER — Telehealth: Payer: Self-pay | Admitting: Neurology

## 2019-04-19 NOTE — Telephone Encounter (Signed)
Due to current COVID 19 pandemic, our office is severely reducing in office visits for at least the next 2 weeks, in order to minimize the risk to our patients and healthcare providers. Pt understands that although there may be some limitations with this type of visit, we will take all precautions to reduce any security or privacy concerns.  Pt understands that this will be treated like an in office visit and we will file with pt's insurance, and there may be a patient responsible charge related to this service. Pt's email is [email or web address]. Pt understands that the cisco webex software must be downloaded and operational on the device pt plans to use for the visit.

## 2019-04-20 NOTE — Telephone Encounter (Signed)
I contacted the pt and completed pre charting for 04/21/19 appt.  Pt understands that although there may be some limitations with this type of visit, we will take all precautions to reduce any security or privacy concerns.  Pt understands that this will be treated like an in office visit and we will file with pt's insurance, and there may be a patient responsible charge related to this service.  Pt's email is [email or web address]. Pt understands that the cisco webex software must be downloaded and operational on the device pt plans to use for the visit.

## 2019-04-21 ENCOUNTER — Other Ambulatory Visit: Payer: Self-pay

## 2019-04-21 ENCOUNTER — Encounter: Payer: Self-pay | Admitting: Neurology

## 2019-04-21 ENCOUNTER — Ambulatory Visit (INDEPENDENT_AMBULATORY_CARE_PROVIDER_SITE_OTHER): Payer: PPO | Admitting: Neurology

## 2019-04-21 DIAGNOSIS — G20 Parkinson's disease: Secondary | ICD-10-CM

## 2019-04-21 MED ORDER — CARBIDOPA-LEVODOPA ER 25-100 MG PO TBCR: 1.0000 | EXTENDED_RELEASE_TABLET | Freq: Three times a day (TID) | ORAL | 1 refills | Status: DC

## 2019-04-21 NOTE — Progress Notes (Signed)
     Virtual Visit via Video Note  I connected with Jacob Bolton on 04/21/19 at 12:00 PM EDT by a video enabled telemedicine application and verified that I am speaking with the correct person using two identifiers.   I discussed the limitations of evaluation and management by telemedicine and the availability of in person appointments. The patient expressed understanding and agreed to proceed.  The patient was at home, Dr. in the office.  History of Present Illness: Mr. Jacob Bolton is a 78 year old right-handed white male with a history of Parkinson's disease.  The patient has been on Mirapex taking 1 mg 3 times daily, he is on carbidopa 25/100 mg tablet taking 1/2 tablet 3 times daily.  The patient has had good mobility, he has not reported any falls.  He has no problems getting up and down from a chair or couch.  He does not have any steps or stairs to go up and down at his house.  The patient has had increased tremor on the right arm, he has had worsening handwriting.  He sleeps fairly well at night but he does have some drowsiness during the day as well.  He may have vivid or weird dreams at night, these are not bothersome to him.  The patient has noted that he will be stooped at times with walking.  He does play golf, but he has not played recently.  He is mildly forgetful, he will occasionally go into a room and forget why he went there.  In December 2019, the patient had a right lower extremity DVT.  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  He is now on Eliquis.  He will be having cataract surgery in the near future.  Overall, he feels that he is functioning fairly well.   Observations/Objective: The WebEx evaluation reveals that the patient is alert and cooperative.  He has mild masking of the face, speech is somewhat hypophonic, not aphasic.  The patient has full extraocular movements.  Facial symmetry is present.  He is able to protrude the tongue in the midline with good lateral movement of  the tongue.  He has good finger-nose-finger and heel shin bilaterally.  Gait is slightly stooped, he is able to walk without assistance.  Tandem gait is normal, Romberg is negative.  Assessment and Plan: 1.  Parkinson's disease  The patient is overall doing fairly well, he is on Mirapex and low-dose Sinemet.  We will increase his Sinemet to 25/100 mg tablets taking 1 full tablet 3 times daily.  A prescription was sent in for the medication.  He will follow-up through this office in 5 months.  He is to remain active.  Follow Up Instructions: Follow-up with me in 5 months.   I discussed the assessment and treatment plan with the patient. The patient was provided an opportunity to ask questions and all were answered. The patient agreed with the plan and demonstrated an understanding of the instructions.   The patient was advised to call back or seek an in-person evaluation if the symptoms worsen or if the condition fails to improve as anticipated.  I provided 25 minutes of non-face-to-face time during this encounter.   Kathrynn Ducking, MD

## 2019-05-04 DIAGNOSIS — D1723 Benign lipomatous neoplasm of skin and subcutaneous tissue of right leg: Secondary | ICD-10-CM | POA: Diagnosis not present

## 2019-05-04 DIAGNOSIS — C4921 Malignant neoplasm of connective and soft tissue of right lower limb, including hip: Secondary | ICD-10-CM | POA: Diagnosis not present

## 2019-05-11 DIAGNOSIS — C4921 Malignant neoplasm of connective and soft tissue of right lower limb, including hip: Secondary | ICD-10-CM | POA: Diagnosis not present

## 2019-05-11 DIAGNOSIS — Z7901 Long term (current) use of anticoagulants: Secondary | ICD-10-CM | POA: Diagnosis not present

## 2019-05-11 DIAGNOSIS — I82409 Acute embolism and thrombosis of unspecified deep veins of unspecified lower extremity: Secondary | ICD-10-CM | POA: Diagnosis not present

## 2019-05-11 DIAGNOSIS — Z79899 Other long term (current) drug therapy: Secondary | ICD-10-CM | POA: Diagnosis not present

## 2019-05-11 DIAGNOSIS — Z01818 Encounter for other preprocedural examination: Secondary | ICD-10-CM | POA: Diagnosis not present

## 2019-05-14 DIAGNOSIS — Z1159 Encounter for screening for other viral diseases: Secondary | ICD-10-CM | POA: Diagnosis not present

## 2019-05-17 DIAGNOSIS — G8918 Other acute postprocedural pain: Secondary | ICD-10-CM | POA: Diagnosis not present

## 2019-05-17 DIAGNOSIS — C4921 Malignant neoplasm of connective and soft tissue of right lower limb, including hip: Secondary | ICD-10-CM | POA: Diagnosis not present

## 2019-05-25 ENCOUNTER — Other Ambulatory Visit: Payer: Self-pay | Admitting: Neurology

## 2019-06-01 DIAGNOSIS — Z4802 Encounter for removal of sutures: Secondary | ICD-10-CM | POA: Diagnosis not present

## 2019-06-17 ENCOUNTER — Other Ambulatory Visit (HOSPITAL_COMMUNITY): Payer: Self-pay | Admitting: *Deleted

## 2019-06-17 NOTE — Progress Notes (Signed)
Created in error

## 2019-06-22 DIAGNOSIS — H25811 Combined forms of age-related cataract, right eye: Secondary | ICD-10-CM | POA: Diagnosis not present

## 2019-06-22 DIAGNOSIS — H2511 Age-related nuclear cataract, right eye: Secondary | ICD-10-CM | POA: Diagnosis not present

## 2019-06-22 DIAGNOSIS — H25011 Cortical age-related cataract, right eye: Secondary | ICD-10-CM | POA: Diagnosis not present

## 2019-06-29 DIAGNOSIS — C4921 Malignant neoplasm of connective and soft tissue of right lower limb, including hip: Secondary | ICD-10-CM | POA: Diagnosis not present

## 2019-07-19 ENCOUNTER — Other Ambulatory Visit: Payer: Self-pay

## 2019-07-19 ENCOUNTER — Ambulatory Visit (HOSPITAL_COMMUNITY)
Admission: RE | Admit: 2019-07-19 | Discharge: 2019-07-19 | Disposition: A | Discharge status: Home / Self Care | Payer: PPO | Source: Ambulatory Visit | Attending: Internal Medicine | Admitting: Internal Medicine

## 2019-07-19 DIAGNOSIS — I82492 Acute embolism and thrombosis of other specified deep vein of left lower extremity: Secondary | ICD-10-CM | POA: Insufficient documentation

## 2019-07-22 ENCOUNTER — Other Ambulatory Visit: Payer: Self-pay

## 2019-07-22 ENCOUNTER — Inpatient Hospital Stay: Payer: PPO

## 2019-07-22 ENCOUNTER — Inpatient Hospital Stay: Payer: PPO | Attending: Internal Medicine | Admitting: Internal Medicine

## 2019-07-22 VITALS — BP 130/80 | HR 74 | Temp 97.8°F | Resp 17 | Ht 71.0 in | Wt 228.9 lb

## 2019-07-22 DIAGNOSIS — I82492 Acute embolism and thrombosis of other specified deep vein of left lower extremity: Secondary | ICD-10-CM

## 2019-07-22 DIAGNOSIS — I10 Essential (primary) hypertension: Secondary | ICD-10-CM | POA: Diagnosis not present

## 2019-07-22 DIAGNOSIS — Z818 Family history of other mental and behavioral disorders: Secondary | ICD-10-CM | POA: Insufficient documentation

## 2019-07-22 DIAGNOSIS — N400 Enlarged prostate without lower urinary tract symptoms: Secondary | ICD-10-CM | POA: Diagnosis not present

## 2019-07-22 DIAGNOSIS — Z8249 Family history of ischemic heart disease and other diseases of the circulatory system: Secondary | ICD-10-CM | POA: Diagnosis not present

## 2019-07-22 DIAGNOSIS — Z882 Allergy status to sulfonamides status: Secondary | ICD-10-CM | POA: Insufficient documentation

## 2019-07-22 DIAGNOSIS — Z87891 Personal history of nicotine dependence: Secondary | ICD-10-CM | POA: Diagnosis not present

## 2019-07-22 DIAGNOSIS — Z7901 Long term (current) use of anticoagulants: Secondary | ICD-10-CM | POA: Diagnosis not present

## 2019-07-22 DIAGNOSIS — Z86718 Personal history of other venous thrombosis and embolism: Secondary | ICD-10-CM | POA: Diagnosis not present

## 2019-07-22 DIAGNOSIS — Z79899 Other long term (current) drug therapy: Secondary | ICD-10-CM | POA: Insufficient documentation

## 2019-07-22 DIAGNOSIS — I700 Atherosclerosis of aorta: Secondary | ICD-10-CM

## 2019-07-22 DIAGNOSIS — C499 Malignant neoplasm of connective and soft tissue, unspecified: Secondary | ICD-10-CM

## 2019-07-22 DIAGNOSIS — R911 Solitary pulmonary nodule: Secondary | ICD-10-CM | POA: Diagnosis not present

## 2019-07-22 DIAGNOSIS — G20 Parkinson's disease: Secondary | ICD-10-CM | POA: Diagnosis not present

## 2019-07-22 DIAGNOSIS — R21 Rash and other nonspecific skin eruption: Secondary | ICD-10-CM | POA: Diagnosis not present

## 2019-07-22 DIAGNOSIS — I82402 Acute embolism and thrombosis of unspecified deep veins of left lower extremity: Secondary | ICD-10-CM | POA: Insufficient documentation

## 2019-07-22 LAB — CBC WITH DIFFERENTIAL (CANCER CENTER ONLY)
Abs Immature Granulocytes: 0.01 10*3/uL (ref 0.00–0.07)
Basophils Absolute: 0 10*3/uL (ref 0.0–0.1)
Basophils Relative: 1 %
Eosinophils Absolute: 0.1 10*3/uL (ref 0.0–0.5)
Eosinophils Relative: 2 %
HCT: 42.2 % (ref 39.0–52.0)
Hemoglobin: 14.3 g/dL (ref 13.0–17.0)
Immature Granulocytes: 0 %
Lymphocytes Relative: 22 %
Lymphs Abs: 0.8 10*3/uL (ref 0.7–4.0)
MCH: 31 pg (ref 26.0–34.0)
MCHC: 33.9 g/dL (ref 30.0–36.0)
MCV: 91.5 fL (ref 80.0–100.0)
Monocytes Absolute: 0.4 10*3/uL (ref 0.1–1.0)
Monocytes Relative: 12 %
Neutro Abs: 2.4 10*3/uL (ref 1.7–7.7)
Neutrophils Relative %: 63 %
Platelet Count: 149 10*3/uL — ABNORMAL LOW (ref 150–400)
RBC: 4.61 MIL/uL (ref 4.22–5.81)
RDW: 13.2 % (ref 11.5–15.5)
WBC Count: 3.7 10*3/uL — ABNORMAL LOW (ref 4.0–10.5)
nRBC: 0 % (ref 0.0–0.2)

## 2019-07-22 LAB — CMP (CANCER CENTER ONLY)
ALT: <6 U/L (ref 0–44)
AST: 18 U/L (ref 15–41)
Albumin: 4.3 g/dL (ref 3.5–5.0)
Alkaline Phosphatase: 57 U/L (ref 38–126)
Anion gap: 8 (ref 5–15)
BUN: 14 mg/dL (ref 8–23)
CO2: 27 mmol/L (ref 22–32)
Calcium: 9.2 mg/dL (ref 8.9–10.3)
Chloride: 101 mmol/L (ref 98–111)
Creatinine: 0.93 mg/dL (ref 0.61–1.24)
GFR, Est AFR Am: >60 mL/min (ref >60)
GFR, Estimated: >60 mL/min (ref >60)
Glucose, Bld: 104 mg/dL — ABNORMAL HIGH (ref 70–99)
Potassium: 3.9 mmol/L (ref 3.5–5.1)
Sodium: 136 mmol/L (ref 135–145)
Total Bilirubin: 1 mg/dL (ref 0.3–1.2)
Total Protein: 7.3 g/dL (ref 6.5–8.1)

## 2019-07-22 LAB — LACTATE DEHYDROGENASE: LDH: 175 U/L (ref 98–192)

## 2019-07-22 NOTE — Progress Notes (Signed)
Diagnosis No diagnosis found.  Staging Cancer Staging No matching staging information was found for the patient.  Assessment and Plan:   1.  Left lower extremity DVT. 78 year old male referred for evaluation due to LLE DVT.  Patient has a history of liposarcoma and is followed by Dr. Redmond Pulling at [facility].  His last MRI done July 24/2019 showed interval increased size of the infiltrative liposarcoma predominantly within the medial compartment with extension into the anterior and posterior compartments.  Patient patient reports he is scheduled for surgery in March.  He reports he is active and plays golf.  Denies any recent travel.  He has a family history of a blood of a brother with a lower extremity DVT.  In December he developed left lower extremity swelling.  He denied any pain.  He had a left lower extremity Doppler done 12/10/2018 that showed IMPRESSION: The study is positive for left lower extremity DVT as described. There is partially occlusive thrombus in the femoral vein, popliteal vein, and peroneal vein. There is occlusive thrombus in the posterior tibial vein.  He has been started on Eliquis.  He reports he is tolerating the medication without problems.  He denies any fracture history.  Last colonoscopy was reportedly 2 years ago.    Hypercoagulable evaluation shows negative Factor V leiden, PT gene mutation, Lupus anticoagulant.  He has normal B2 Glycoprotein ab.  .  Due to the history of liposarcoma CT chest abdomen pelvis for any evidence of advanced malignancy as a possible etiology for his DVT.    CT CAP done 02/23/2019 reviewed and showed  IMPRESSION: 1. No lymphadenopathy or left pelvic sidewall mass in this patient with known left lower extremity DVT. 2. Venous opacification in the pelvis is suboptimal, but there is no enlargement of the left external iliac vein are left common femoral vein. No perivascular edema as typically seen in the setting of acute DVT. 3. No  evidence for metastatic disease in the chest, abdomen, or pelvis. 4. 3 mm noncalcified left upper lobe pulmonary nodule. Continued attention on follow-up recommended. 5.  Aortic Atherosclerois (ICD10-170.0) 6. Prostatomegaly. 7. Incompletely visualized fat soft tissue density mass in the medial right thigh consistent with the patient's known liposarcoma.  Bilateral LE doppler done 07/19/2019 showed Summary: Right: No evidence of common femoral vein obstruction. Left: There is no evidence of deep vein thrombosis in the lower extremity. No cystic structure found in the popliteal fossa. There is resolution of DVT in the left leg when compared to prior study.    Pt shows evidence of resolution of left DVT originally diagnosed in 11/2018.  I discussed with him potential risk factors for DVT which include surgery, sedentary lifestyle, hormonal therapy, travel, prior thrombosis and malignancy.  Pt has liposarcoma of right LE.  Clot was in left LE and recent doppler shows resolution of clot.  Pt has option of coming off Eliquis as he has completed 6 months of therapy.  I have outlined potential risks of recurrent thrombosis.  Pt should notify the office if any change in symptoms once off Eliquis.  He will RTC in 11/2019 for follow-up.  All questions answered and he expressed understanding of the information presented.  2.  Liposarcoma.  This was affecting the right lower extremity.  Pathology shows low grade process.   Patient had an MRI done July 22, 2018 that showed interval increase size of the infiltrative liposarcoma primarily in the medial compartment with extension into the anterior and posterior  compartments.  He is followed by Dr. Redmond Pulling at [place] has reportedly undergone additional surgery and is planned for repeat imaging in 11/2019.  Pt should continue to follow-up with Dr. Redmond Pulling as recommended.    3.  Parkinson's.  Follow-up with PCP or neurology as recommended.  4.  Hypertension.   Blood pressure is 130/80.    Follow-up with PCP.  5.  Left upper lobe Pulmonary nodule.  This is nonspecific measures 3 mm.  Pt denies smoking.  He is set up for repeat imaging Per Dr. Redmond Pulling in 11/2019.    6.  Health maintenance.  Patient reportedly had a colonoscopy 2 years ago.  He should follow-up with GI as recommended.  25 minutes spent with more than 50% spent in review of records, counseling and coordination of care.  Interval History:  Historical data obtained from note dated 02/11/2019:  78 year old male referred for evaluation due to LLE DVT.  Patient has a history of liposarcoma and is followed by Dr. Redmond Pulling at [facility].  His last MRI done July 24/2019 showed interval increased size of the infiltrative liposarcoma predominantly within the medial compartment with extension into the anterior and posterior compartments.  Patient patient reports he is scheduled for surgery in March.  He reports he is active and plays golf.  Denies any recent travel.  He has a family history of a blood of a brother with a lower extremity DVT.  In December he developed left lower extremity swelling.  He denied any pain.  He had a left lower extremity Doppler done 12/10/2018 that showed IMPRESSION: The study is positive for left lower extremity DVT as described. There is partially occlusive thrombus in the femoral vein, popliteal vein, and peroneal vein. There is occlusive thrombus in the posterior tibial vein.  He has been started on Eliquis.  He reports he is tolerating the medication without problems.  He denies any fracture history.  Last colonoscopy was reportedly 2 years ago.   Current Status:  Pt is seen today to go over USN.  He reports he has surgery with Dr. Redmond Pulling at [facility] and is set up for repeat imaging in 11/2019.    Oncology History   No history exists.     Problem List Patient Active Problem List   Diagnosis Date Noted  . Parkinson disease ([place]) [G20] 12/03/2016    Past  Medical History Past Medical History:  Diagnosis Date  . High blood pressure   . Parkinson disease ([place]) 12/03/2016    Past Surgical History Past Surgical History:  Procedure Laterality Date  . CERVICAL DISC SURGERY  2007   Ruptured disc, plates and screws  . COLONOSCOPY  2008  . 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 Leonides Schanz, [facility]    Family History Family History  Problem Relation Age of Onset  . High blood pressure Mother   . Anxiety disorder Mother   . High blood pressure Father   . Heart attack Father      Social History  reports that he has quit smoking. He has never used smokeless tobacco. He reports current alcohol use of about 3.0 standard drinks of alcohol per week. He reports that he does not use drugs.  Medications  Current Outpatient Medications:  .  Azelastine HCl 0.15 % SOLN, , Disp: , Rfl:  .  Carbidopa-Levodopa ER (SINEMET CR) 25-100 MG tablet controlled release, Take 1 tablet by mouth 3 (three)  times daily., Disp: 270 tablet, Rfl: 1 .  Cholecalciferol (VITAMIN D-1000 MAX ST) 1000 units tablet, Take by mouth., Disp: , Rfl:  .  dutasteride (AVODART) 0.5 MG capsule, , Disp: , Rfl:  .  ELIQUIS STARTER PACK (ELIQUIS STARTER PACK) 5 MG TABS, Take as directed on package: start with two-5mg  tablets twice daily for 7 days. On day 8, switch to one-5mg  tablet twice daily., Disp: 1 each, Rfl: 0 .  fluticasone (FLONASE) 50 MCG/ACT nasal spray, , Disp: , Rfl:  .  losartan-hydrochlorothiazide (HYZAAR) 100-12.5 MG tablet, Take by mouth., Disp: , Rfl:  .  pramipexole (MIRAPEX) 1 MG tablet, TAKE 1 TABLET BY MOUTH 3 TIMES A DAY, Disp: 270 tablet, Rfl: 1 .  propranolol (INDERAL) 10 MG tablet, Take 1 tablet (10 mg total) by mouth 3 (three) times daily as needed., Disp: 30 tablet, Rfl: 1 .  terazosin (HYTRIN) 10 MG capsule, Take 10 mg by mouth., Disp: , Rfl:   Allergies Sulfamethoxazole  Review of Systems Review of Systems - Oncology ROS  negative   Physical Exam  Vitals Wt Readings from Last 3 Encounters:  07/22/19 228 lb 14.4 oz (103.8 kg)  03/04/19 226 lb 4.8 oz (102.6 kg)  02/11/19 244 lb 12.8 oz (111 kg)   Temp Readings from Last 3 Encounters:  07/22/19 97.8 F (36.6 C) (Oral)  03/04/19 98.4 F (36.9 C) (Oral)  02/11/19 (!) 97.5 F (36.4 C) (Oral)   BP Readings from Last 3 Encounters:  07/22/19 130/80  03/04/19 109/74  02/11/19 (!) 144/86   Pulse Readings from Last 3 Encounters:  07/22/19 74  03/04/19 75  02/11/19 78    Constitutional: Well-developed, well-nourished, and in no distress.   HENT: Head: Normocephalic and atraumatic.  Mouth/Throat: No oropharyngeal exudate. Mucosa moist. Eyes: Pupils are equal, round, and reactive to light. Conjunctivae are normal. No scleral icterus.  Neck: Normal range of motion. Neck supple. No JVD present.  Cardiovascular: Normal rate, regular rhythm and normal heart sounds.  Exam reveals no gallop and no friction rub.   No murmur heard. Pulmonary/Chest: Effort normal and breath sounds normal. No respiratory distress. No wheezes.No rales.  Abdominal: Soft. Bowel sounds are normal. No distension. There is no tenderness. There is no guarding.  Musculoskeletal: No edema or tenderness.  Minor varicosities noted.   Lymphadenopathy: No cervical, axillary or supraclavicular adenopathy.  Neurological: Alert and oriented to person, place, and time. No cranial nerve deficit.  Skin: Skin is warm and dry. No rash noted. No erythema. No pallor.  Psychiatric: Affect and judgment normal.   Labs Appointment on 07/22/2019  Component Date Value Ref Range Status  . WBC Count 07/22/2019 3.7* 4.0 - 10.5 K/uL Final  . RBC 07/22/2019 4.61  4.22 - 5.81 MIL/uL Final  . Hemoglobin 07/22/2019 14.3  13.0 - 17.0 g/dL Final  . HCT 07/22/2019 42.2  39.0 - 52.0 % Final  . MCV 07/22/2019 91.5  80.0 - 100.0 fL Final  . MCH 07/22/2019 31.0  26.0 - 34.0 pg Final  . MCHC 07/22/2019 33.9  30.0 -  36.0 g/dL Final  . RDW 07/22/2019 13.2  11.5 - 15.5 % Final  . Platelet Count 07/22/2019 149* 150 - 400 K/uL Final  . nRBC 07/22/2019 0.0  0.0 - 0.2 % Final  . Neutrophils Relative % 07/22/2019 63  % Final  . Neutro Abs 07/22/2019 2.4  1.7 - 7.7 K/uL Final  . Lymphocytes Relative 07/22/2019 22  % Final  . Lymphs Abs 07/22/2019 0.8  0.7 - 4.0 K/uL Final  . Monocytes Relative 07/22/2019 12  % Final  . Monocytes Absolute 07/22/2019 0.4  0.1 - 1.0 K/uL Final  . Eosinophils Relative 07/22/2019 2  % Final  . Eosinophils Absolute 07/22/2019 0.1  0.0 - 0.5 K/uL Final  . Basophils Relative 07/22/2019 1  % Final  . Basophils Absolute 07/22/2019 0.0  0.0 - 0.1 K/uL Final  . Immature Granulocytes 07/22/2019 0  % Final  . Abs Immature Granulocytes 07/22/2019 0.01  0.00 - 0.07 K/uL Final   Performed at [facility], [place] [address] [place], [place] [address]  . LDH 07/22/2019 175  98 - 192 U/L Final   Performed at [facility], [place] [address], [place], [place] [address]  . Sodium 07/22/2019 136  135 - 145 mmol/L Final  . Potassium 07/22/2019 3.9  3.5 - 5.1 mmol/L Final  . Chloride 07/22/2019 101  98 - 111 mmol/L Final  . CO2 07/22/2019 27  22 - 32 mmol/L Final  . Glucose, Bld 07/22/2019 104* 70 - 99 mg/dL Final  . BUN 07/22/2019 14  8 - 23 mg/dL Final  . Creatinine 07/22/2019 0.93  0.61 - 1.24 mg/dL Final  . Calcium 07/22/2019 9.2  8.9 - 10.3 mg/dL Final  . Total Protein 07/22/2019 7.3  6.5 - 8.1 g/dL Final  . Albumin 07/22/2019 4.3  3.5 - 5.0 g/dL Final  . AST 07/22/2019 18  15 - 41 U/L Final  . ALT 07/22/2019 <6  0 - 44 U/L Final  . Alkaline Phosphatase 07/22/2019 57  38 - 126 U/L Final  . Total Bilirubin 07/22/2019 1.0  0.3 - 1.2 mg/dL Final  . GFR, Est Non Af Am 07/22/2019 >60  >60 mL/min Final  . GFR, Est AFR Am 07/22/2019 >60  >60 mL/min Final  . Anion gap 07/22/2019 8  5 - 15 Final   Performed at [facility], [place] [address] [place],  [address]     Pathology No orders of the defined types were placed in this encounter.      Zoila Shutter MD

## 2019-08-02 DIAGNOSIS — D225 Melanocytic nevi of trunk: Secondary | ICD-10-CM | POA: Diagnosis not present

## 2019-08-02 DIAGNOSIS — D1801 Hemangioma of skin and subcutaneous tissue: Secondary | ICD-10-CM | POA: Diagnosis not present

## 2019-08-02 DIAGNOSIS — L821 Other seborrheic keratosis: Secondary | ICD-10-CM | POA: Diagnosis not present

## 2019-08-02 DIAGNOSIS — D2261 Melanocytic nevi of right upper limb, including shoulder: Secondary | ICD-10-CM | POA: Diagnosis not present

## 2019-08-02 DIAGNOSIS — L570 Actinic keratosis: Secondary | ICD-10-CM | POA: Diagnosis not present

## 2019-08-12 DIAGNOSIS — Z86010 Personal history of colonic polyps: Secondary | ICD-10-CM | POA: Diagnosis not present

## 2019-08-12 DIAGNOSIS — Z86718 Personal history of other venous thrombosis and embolism: Secondary | ICD-10-CM | POA: Diagnosis not present

## 2019-08-12 DIAGNOSIS — Z85831 Personal history of malignant neoplasm of soft tissue: Secondary | ICD-10-CM | POA: Diagnosis not present

## 2019-08-12 DIAGNOSIS — R7301 Impaired fasting glucose: Secondary | ICD-10-CM | POA: Diagnosis not present

## 2019-08-12 DIAGNOSIS — N400 Enlarged prostate without lower urinary tract symptoms: Secondary | ICD-10-CM | POA: Diagnosis not present

## 2019-08-12 DIAGNOSIS — Z13220 Encounter for screening for lipoid disorders: Secondary | ICD-10-CM | POA: Diagnosis not present

## 2019-08-12 DIAGNOSIS — I10 Essential (primary) hypertension: Secondary | ICD-10-CM | POA: Diagnosis not present

## 2019-08-12 DIAGNOSIS — Z0000 Encounter for general adult medical examination without abnormal findings: Secondary | ICD-10-CM | POA: Diagnosis not present

## 2019-08-12 DIAGNOSIS — I350 Nonrheumatic aortic (valve) stenosis: Secondary | ICD-10-CM | POA: Diagnosis not present

## 2019-08-12 DIAGNOSIS — R911 Solitary pulmonary nodule: Secondary | ICD-10-CM | POA: Diagnosis not present

## 2019-08-12 DIAGNOSIS — G20 Parkinson's disease: Secondary | ICD-10-CM | POA: Diagnosis not present

## 2019-08-25 DIAGNOSIS — S40812A Abrasion of left upper arm, initial encounter: Secondary | ICD-10-CM | POA: Diagnosis not present

## 2019-09-30 ENCOUNTER — Encounter: Payer: Self-pay | Admitting: Neurology

## 2019-09-30 ENCOUNTER — Other Ambulatory Visit: Payer: Self-pay

## 2019-09-30 ENCOUNTER — Ambulatory Visit (INDEPENDENT_AMBULATORY_CARE_PROVIDER_SITE_OTHER): Payer: PPO | Admitting: Neurology

## 2019-09-30 VITALS — BP 132/84 | HR 59 | Temp 97.1°F | Ht 71.0 in | Wt 231.5 lb

## 2019-09-30 DIAGNOSIS — G20 Parkinson's disease: Secondary | ICD-10-CM

## 2019-09-30 DIAGNOSIS — R413 Other amnesia: Secondary | ICD-10-CM | POA: Insufficient documentation

## 2019-09-30 HISTORY — DX: Other amnesia: R41.3

## 2019-09-30 MED ORDER — CARBIDOPA-LEVODOPA ER 25-100 MG PO TBCR: 2.0000 | EXTENDED_RELEASE_TABLET | Freq: Three times a day (TID) | ORAL | 1 refills | Status: DC

## 2019-09-30 NOTE — Patient Instructions (Signed)
We will go up on the Sinemet to 1.5 tablets three times a day for 4 weeks, then begin 2 tablets three times a day.  Sinemet (carbidopa) may result in confusion or hallucinations, drowsiness, nausea, or dizziness. If any significant side effects are noted, please contact our office. Sinemet may not be well absorbed when taken with high protein meals, if tolerated it is best to take 30-45 minutes before you eat.

## 2019-09-30 NOTE — Progress Notes (Signed)
Reason for visit: Parkinson's disease  Jacob Bolton is an 78 y.o. male  History of present illness:  Mr. Jacob Bolton is a 78 year old right-handed white male with a history of Parkinson's disease.  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  He feels that the Parkinson's disease has not affected this activity.  The patient has had some ongoing problems with mobility, he is having some difficulty getting up out of a low chair.  He has not reported any falls.  He tries to stay active but he is no longer able to play golf because of his low back pain.  The patient is concerned that he is becoming more forgetful, he is having difficulty with remembering names for people.  At times, he may drop objects from his hands.  The patient is sleeping fairly well at night.  He sometimes reports a sensation that someone is in the room with him, when he looks around no one is there.  He denies any overt hallucinations.  He currently is on Mirapex taking 1 mg 3 times daily and he is on Sinemet taking the 25/100 mg tablets, 1 tablet 3 times daily.  Past Medical History:  Diagnosis Date  . High blood pressure   . Memory disorder 09/30/2019  . Parkinson disease ([place]) 12/03/2016    Past Surgical History:  Procedure Laterality Date  . CERVICAL DISC SURGERY  2007   Ruptured disc, plates and screws  . COLONOSCOPY  2008  . 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 Leonides Schanz, [facility]    Family History  Problem Relation Age of Onset  . High blood pressure Mother   . Anxiety disorder Mother   . High blood pressure Father   . Heart attack Father     Social history:  reports that he has quit smoking. He has never used smokeless tobacco. He reports current alcohol use of about 3.0 standard drinks of alcohol per week. He reports that he does not use drugs.    Allergies  Allergen Reactions  . Sulfamethoxazole     Other reaction(s): Other (See Comments) Childhood allergy    Medications:   Prior to Admission medications   Medication Sig Start Date End Date Taking? Authorizing Provider  Azelastine HCl 0.15 % SOLN  11/11/16  Yes [provider]  Carbidopa-Levodopa ER (SINEMET CR) 25-100 MG tablet controlled release Take 1 tablet by mouth 3 (three) times daily. 04/21/19  Yes Kathrynn Ducking, MD  Cholecalciferol (VITAMIN D-1000 MAX ST) 1000 units tablet Take by mouth.   Yes [provider]  dutasteride (AVODART) 0.5 MG capsule  10/23/16  Yes [provider]  ELIQUIS STARTER PACK (ELIQUIS STARTER PACK) 5 MG TABS Take as directed on package: start with two-5mg  tablets twice daily for 7 days. On day 8, switch to one-5mg  tablet twice daily. 12/10/18  Yes Little, Wenda Overland, MD  fluticasone Asencion Islam) 50 MCG/ACT nasal spray  10/28/16  Yes [provider]  losartan-hydrochlorothiazide (HYZAAR) 100-12.5 MG tablet Take by mouth.   Yes [provider]  pramipexole (MIRAPEX) 1 MG tablet TAKE 1 TABLET BY MOUTH 3 TIMES A DAY 05/25/19  Yes Kathrynn Ducking, MD  propranolol (INDERAL) 10 MG tablet Take 1 tablet (10 mg total) by mouth 3 (three) times daily as needed. 03/18/18  Yes Kathrynn Ducking, MD  terazosin (HYTRIN) 10 MG capsule Take 10 mg by mouth. 05/20/12  Yes [provider]    ROS:  Out of a complete 14 system review of symptoms, the patient complains only of the following symptoms, and all other reviewed systems are negative.  Low back pain Memory disturbance  Blood pressure 132/84, pulse (!) 59, temperature (!) 97.1 F (36.2 C), temperature source Temporal, height 5\' 11"  (1.803 m), weight 231 lb 8 oz (105 kg), SpO2 99 %.  Physical Exam  General: The patient is alert and cooperative at the time of the examination.  Skin: No significant peripheral edema is noted.   Neurologic Exam  Mental status: The patient is alert and oriented x 3 at the time of the examination. The Mini-Mental status examination done today shows a  total score 22/30.   Cranial nerves: Facial symmetry is present. Speech is normal, no aphasia or dysarthria is noted. Extraocular movements are full. Visual fields are full.  Masking the face is seen.  Voice is somewhat of a low amplitude.  Motor: The patient has good strength in all 4 extremities.  Sensory examination: Soft touch sensation is symmetric on the face, arms, and legs.  Coordination: The patient has good finger-nose-finger and heel-to-shin bilaterally.  Slight resting tremors are seen bilaterally.  Gait and station: The patient has some difficulty arising from a seated position with arms crossed, he can stand up by pushing off.  Once up, he is able to walk independently, decreased arm swing seen bilaterally, slightly stooped posture is noted. Tandem gait is normal. Romberg is negative. No drift is seen.  Reflexes: Deep tendon reflexes are symmetric.   Assessment/Plan:  1.  Parkinson's disease  2.  Reported mild memory disturbance  The patient will be followed for the memory.  He appears to have ongoing problems with bradykinesia, the patient will be increased on the Sinemet taking the 25/100 mg tablets, 1.5 tablets 3 times daily for 1 month and then go up to 2 tablets 3 times daily.  He will call if he has any problems with this dose increase.  He will follow-up in about 5 months.  He is to try to remain as active as possible.  [name] MD 09/30/2019 8:26 AM  [facility] [address] [place], [place] [address]  Phone [phone] Fax [phone]

## 2019-10-14 ENCOUNTER — Other Ambulatory Visit: Payer: Self-pay | Admitting: Neurology

## 2019-11-14 ENCOUNTER — Other Ambulatory Visit: Payer: Self-pay | Admitting: Neurology

## 2019-11-24 ENCOUNTER — Telehealth: Payer: Self-pay | Admitting: Oncology

## 2019-11-24 NOTE — Telephone Encounter (Signed)
[facility] transfer to [facility]. Patient also being followed at [facility].   Per Dr. Alen Blew no f/u needed at this time. Patient can call in the future for appointment should he have problems/concerns or schedule for January 2021 if he would like appointment anyway. Per patient he is still being followed by [facility] and will call in the future should he need appointment.

## 2019-12-28 DIAGNOSIS — Z9889 Other specified postprocedural states: Secondary | ICD-10-CM | POA: Diagnosis not present

## 2019-12-28 DIAGNOSIS — C4921 Malignant neoplasm of connective and soft tissue of right lower limb, including hip: Secondary | ICD-10-CM | POA: Diagnosis not present

## 2020-01-13 DIAGNOSIS — N401 Enlarged prostate with lower urinary tract symptoms: Secondary | ICD-10-CM | POA: Diagnosis not present

## 2020-01-13 DIAGNOSIS — R3915 Urgency of urination: Secondary | ICD-10-CM | POA: Diagnosis not present

## 2020-03-02 ENCOUNTER — Ambulatory Visit: Payer: PPO | Admitting: Neurology

## 2020-03-21 ENCOUNTER — Other Ambulatory Visit: Payer: Self-pay | Admitting: Neurology

## 2020-05-05 ENCOUNTER — Other Ambulatory Visit: Payer: Self-pay | Admitting: Neurology

## 2020-06-08 DIAGNOSIS — N400 Enlarged prostate without lower urinary tract symptoms: Secondary | ICD-10-CM | POA: Diagnosis not present

## 2020-06-08 DIAGNOSIS — I10 Essential (primary) hypertension: Secondary | ICD-10-CM | POA: Diagnosis not present

## 2020-06-19 ENCOUNTER — Encounter: Payer: Self-pay | Admitting: Neurology

## 2020-06-19 ENCOUNTER — Ambulatory Visit: Payer: PPO | Admitting: Neurology

## 2020-06-19 VITALS — BP 132/77 | HR 67 | Ht 71.0 in | Wt 232.0 lb

## 2020-06-19 DIAGNOSIS — M545 Low back pain: Secondary | ICD-10-CM

## 2020-06-19 DIAGNOSIS — G20 Parkinson's disease: Secondary | ICD-10-CM

## 2020-06-19 DIAGNOSIS — R413 Other amnesia: Secondary | ICD-10-CM | POA: Diagnosis not present

## 2020-06-19 DIAGNOSIS — G8929 Other chronic pain: Secondary | ICD-10-CM | POA: Diagnosis not present

## 2020-06-19 NOTE — Progress Notes (Signed)
Reason for visit: Parkinson's disease, chronic low back pain, memory disturbance  Jacob Bolton is an 79 y.o. male  History of present illness:  Mr. Melody is a 79 year old right-handed white male with a history of Parkinson's disease.  The patient currently is on the 25/100 mg Sinemet tablets, taking 2 tablets 3 times daily.  He tolerates this fairly well.  At times, he may see the edges of objects vibrate or move, otherwise he does not have any overt hallucinations.  He has had several episodes where he will be asleep when he feels as if someone is tapping him to wake him up, and there is no one there.  The patient denies any falls.  He does continue to have some memory issues, his wife is taking over the finances.  The patient still operates a motor vehicle without difficulty.  He has had a lot of chronic issues with low back pain, he has never had a work-up for this.  The back pain is mainly on the right side and does not radiate down the leg.  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  He indicates that he is not sleeping well at times.  He returns to the office today for an evaluation.  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  Past Medical History:  Diagnosis Date  . High blood pressure   . Memory disorder 09/30/2019  . Parkinson disease ([place]) 12/03/2016    Past Surgical History:  Procedure Laterality Date  . CERVICAL DISC SURGERY  2007   Ruptured disc, plates and screws  . COLONOSCOPY  2008  . 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 Leonides Schanz, [facility]    Family History  Problem Relation Age of Onset  . High blood pressure Mother   . Anxiety disorder Mother   . High blood pressure Father   . Heart attack Father     Social history:  reports that he has quit smoking. He has never used smokeless tobacco. He reports current alcohol use of about 3.0 standard drinks of alcohol per week. He reports  that he does not use drugs.    Allergies  Allergen Reactions  . Sulfamethoxazole     Other reaction(s): Other (See Comments) Childhood allergy    Medications:  Prior to Admission medications   Medication Sig Start Date End Date Taking? Authorizing Provider  Azelastine HCl 0.15 % SOLN  11/11/16  Yes [provider]  Carbidopa-Levodopa ER (SINEMET CR) 25-100 MG tablet controlled release TAKE 2 TABLETS BY MOUTH 3 TIMES DAILY. 03/21/20  Yes Kathrynn Ducking, MD  Cholecalciferol (VITAMIN D-1000 MAX ST) 1000 units tablet Take by mouth.   Yes [provider]  dutasteride (AVODART) 0.5 MG capsule  10/23/16  Yes [provider]  ELIQUIS STARTER PACK (ELIQUIS STARTER PACK) 5 MG TABS Take as directed on package: start with two-5mg  tablets twice daily for 7 days. On day 8, switch to one-5mg  tablet twice daily. 12/10/18  Yes Little, Wenda Overland, MD  fluticasone Asencion Islam) 50 MCG/ACT nasal spray  10/28/16  Yes [provider]  losartan-hydrochlorothiazide (HYZAAR) 100-12.5 MG tablet Take by mouth.   Yes [provider]  pramipexole (MIRAPEX) 1 MG tablet TAKE 1 TABLET BY MOUTH THREE TIMES A DAY 05/08/20  Yes Kathrynn Ducking, MD  propranolol (INDERAL) 10 MG tablet Take 1 tablet (10 mg total) by mouth 3 (  three) times daily as needed. 03/18/18  Yes Kathrynn Ducking, MD  terazosin (HYTRIN) 10 MG capsule Take 10 mg by mouth. 05/20/12  Yes [provider]    ROS:  Out of a complete 14 system review of symptoms, the patient complains only of the following symptoms, and all other reviewed systems are negative.  Right thigh pain Right low back pain Memory problems  Blood pressure 132/77, pulse 67, height 5\' 11"  (1.803 m), weight 232 lb (105.2 kg).  Physical Exam  General: The patient is alert and cooperative at the time of the examination.  The patient is mildly obese.  Skin: 1+ edema at the ankles is seen bilaterally.   Neurologic  Exam  Mental status: The patient is alert and oriented x 3 at the time of the examination. The Mini-Mental status examination done today shows a total score 25/30.   Cranial nerves: Facial symmetry is present. Speech is normal, no aphasia or dysarthria is noted. Extraocular movements are full. Visual fields are full.  Masking of the face is seen.  Motor: The patient has good strength in all 4 extremities.  Sensory examination: Soft touch sensation is symmetric on the face, arms, and legs.  Coordination: The patient has good finger-nose-finger and heel-to-shin bilaterally.  Gait and station: The patient is not able to arise from a seated position with arms crossed.  He can push off with his arms and stand up, he is able to walk independently with good stride and good turns, he has symmetric arm swing but decreased arm swing bilaterally.  Tandem gait is normal. Romberg is negative. No drift is seen.  Reflexes: Deep tendon reflexes are symmetric.   Assessment/Plan:  1.  Parkinson's disease  2.  Memory disturbance  3.  Chronic right-sided low back pain  The patient is urged to continue to be active.  He has never had any evaluation for his low back pain, I will get an x-ray of the back.  He may take Advil if needed for the back pain.  He indicates that lifting or trying to play golf will worsen the back pain.  I have brought up the possibility of using gabapentin for his back pain, if he desires to do this he will let me know.  He will stay on the current dose of Sinemet and Mirapex.  We discussed the possibility of going on medication such as Aricept for the memory, once again he will call me if he desires to do this.  He will follow-up in 5 months.  We will follow the memory issues over time.  [name] MD 06/19/2020 8:06 AM  [facility] [address] [place], [place] [address]  Phone [phone] Fax [phone]

## 2020-06-27 DIAGNOSIS — C4921 Malignant neoplasm of connective and soft tissue of right lower limb, including hip: Secondary | ICD-10-CM | POA: Diagnosis not present

## 2020-08-01 DIAGNOSIS — N400 Enlarged prostate without lower urinary tract symptoms: Secondary | ICD-10-CM | POA: Diagnosis not present

## 2020-08-01 DIAGNOSIS — I10 Essential (primary) hypertension: Secondary | ICD-10-CM | POA: Diagnosis not present

## 2020-08-03 DIAGNOSIS — H25012 Cortical age-related cataract, left eye: Secondary | ICD-10-CM | POA: Diagnosis not present

## 2020-08-03 DIAGNOSIS — H5010 Unspecified exotropia: Secondary | ICD-10-CM | POA: Diagnosis not present

## 2020-08-03 DIAGNOSIS — H2512 Age-related nuclear cataract, left eye: Secondary | ICD-10-CM | POA: Diagnosis not present

## 2020-08-03 DIAGNOSIS — H524 Presbyopia: Secondary | ICD-10-CM | POA: Diagnosis not present

## 2020-08-21 ENCOUNTER — Telehealth: Payer: Self-pay | Admitting: Physical Medicine and Rehabilitation

## 2020-08-21 NOTE — Telephone Encounter (Signed)
Patient called requesting an new patient appointment for back pains. Patient does not have a referral nor seen any other Ortho doctors. Patient asked to set an appt to be seen by Dr. Ernestina Patches. Please call patient at [phone].

## 2020-08-22 NOTE — Telephone Encounter (Signed)
Scheduled for new patient OV.

## 2020-08-24 DIAGNOSIS — N400 Enlarged prostate without lower urinary tract symptoms: Secondary | ICD-10-CM | POA: Diagnosis not present

## 2020-08-24 DIAGNOSIS — D696 Thrombocytopenia, unspecified: Secondary | ICD-10-CM | POA: Diagnosis not present

## 2020-08-24 DIAGNOSIS — Z86718 Personal history of other venous thrombosis and embolism: Secondary | ICD-10-CM | POA: Diagnosis not present

## 2020-08-24 DIAGNOSIS — R972 Elevated prostate specific antigen [PSA]: Secondary | ICD-10-CM | POA: Diagnosis not present

## 2020-08-24 DIAGNOSIS — I10 Essential (primary) hypertension: Secondary | ICD-10-CM | POA: Diagnosis not present

## 2020-08-24 DIAGNOSIS — Z0000 Encounter for general adult medical examination without abnormal findings: Secondary | ICD-10-CM | POA: Diagnosis not present

## 2020-08-24 DIAGNOSIS — G20 Parkinson's disease: Secondary | ICD-10-CM | POA: Diagnosis not present

## 2020-08-24 DIAGNOSIS — R413 Other amnesia: Secondary | ICD-10-CM | POA: Diagnosis not present

## 2020-08-24 DIAGNOSIS — Z86010 Personal history of colonic polyps: Secondary | ICD-10-CM | POA: Diagnosis not present

## 2020-08-24 DIAGNOSIS — R7301 Impaired fasting glucose: Secondary | ICD-10-CM | POA: Diagnosis not present

## 2020-08-24 DIAGNOSIS — Z85831 Personal history of malignant neoplasm of soft tissue: Secondary | ICD-10-CM | POA: Diagnosis not present

## 2020-08-30 ENCOUNTER — Other Ambulatory Visit: Payer: Self-pay

## 2020-08-30 ENCOUNTER — Encounter: Payer: Self-pay | Admitting: Physical Medicine and Rehabilitation

## 2020-08-30 ENCOUNTER — Ambulatory Visit: Payer: PPO

## 2020-08-30 ENCOUNTER — Ambulatory Visit: Payer: PPO | Admitting: Physical Medicine and Rehabilitation

## 2020-08-30 VITALS — BP 103/62 | HR 91

## 2020-08-30 DIAGNOSIS — M47816 Spondylosis without myelopathy or radiculopathy, lumbar region: Secondary | ICD-10-CM | POA: Diagnosis not present

## 2020-08-30 DIAGNOSIS — C4922 Malignant neoplasm of connective and soft tissue of left lower limb, including hip: Secondary | ICD-10-CM

## 2020-08-30 DIAGNOSIS — G8929 Other chronic pain: Secondary | ICD-10-CM

## 2020-08-30 DIAGNOSIS — G20 Parkinson's disease: Secondary | ICD-10-CM

## 2020-08-30 DIAGNOSIS — M419 Scoliosis, unspecified: Secondary | ICD-10-CM

## 2020-08-30 DIAGNOSIS — M5441 Lumbago with sciatica, right side: Secondary | ICD-10-CM | POA: Diagnosis not present

## 2020-08-30 MED ORDER — MELOXICAM 15 MG PO TABS: 15.0000 mg | ORAL_TABLET | Freq: Every day | ORAL | 0 refills | Status: DC

## 2020-08-30 NOTE — Progress Notes (Signed)
Right sided low back pain. Hurts when trying to swing a golf club. Right leg pain. Had surgery about 6 months ago for tumor on right leg. States that surgeon it the sciatic nerve. Pain not as bad with standing or walking, but pain increases in leg with sitting. Parkinsons. Numeric Pain Rating Scale and Functional Assessment Average Pain 8 Pain Right Now 0 My pain is intermittent, dull and aching Pain is worse with: sitting and some activites Pain improves with: moving- leg   In the last MONTH (on 0-10 scale) has pain interfered with the following?  1. General activity like being  able to carry out your everyday physical activities such as walking, climbing stairs, carrying groceries, or moving a chair?  Rating(1)  2. Relation with others like being able to carry out your usual social activities and roles such as  activities at home, at work and in your community. Rating(1)  3. Enjoyment of life such that you have  been bothered by emotional problems such as feeling anxious, depressed or irritable?  Rating(8)

## 2020-09-07 DIAGNOSIS — Z86010 Personal history of colonic polyps: Secondary | ICD-10-CM | POA: Diagnosis not present

## 2020-09-11 ENCOUNTER — Ambulatory Visit (INDEPENDENT_AMBULATORY_CARE_PROVIDER_SITE_OTHER): Payer: PPO | Admitting: Rehabilitative and Restorative Service Providers"

## 2020-09-11 ENCOUNTER — Other Ambulatory Visit: Payer: Self-pay | Admitting: Neurology

## 2020-09-11 ENCOUNTER — Other Ambulatory Visit: Payer: Self-pay

## 2020-09-11 ENCOUNTER — Encounter: Payer: Self-pay | Admitting: Rehabilitative and Restorative Service Providers"

## 2020-09-11 DIAGNOSIS — R293 Abnormal posture: Secondary | ICD-10-CM

## 2020-09-11 DIAGNOSIS — G8929 Other chronic pain: Secondary | ICD-10-CM

## 2020-09-11 DIAGNOSIS — R262 Difficulty in walking, not elsewhere classified: Secondary | ICD-10-CM

## 2020-09-11 DIAGNOSIS — M79604 Pain in right leg: Secondary | ICD-10-CM

## 2020-09-11 DIAGNOSIS — M5441 Lumbago with sciatica, right side: Secondary | ICD-10-CM

## 2020-09-11 NOTE — Therapy (Signed)
[place], [place], [address] Phone: [phone]   Fax:  [phone]  Physical Therapy Evaluation  Patient Details  Name: Jacob Bolton MRN: [id number] Date of Birth: 1941/05/31 Referring Provider (PT): Dr. Ernestina Patches   Encounter Date: 09/11/2020   PT End of Session - 09/11/20 0841    Visit Number 1    Number of Visits 12    Date for PT Re-Evaluation 11/06/20    Progress Note Due on Visit 10    PT Start Time 0800    PT Stop Time 0837    PT Time Calculation (min) 37 min    Activity Tolerance Patient tolerated treatment well    Behavior During Therapy [facility] for tasks assessed/performed           Past Medical History:  Diagnosis Date  . High blood pressure   . Memory disorder 09/30/2019  . Parkinson disease ([place]) 12/03/2016    Past Surgical History:  Procedure Laterality Date  . CERVICAL DISC SURGERY  2007   Ruptured disc, plates and screws  . COLONOSCOPY  2008  . 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 Leonides Schanz, [facility]    There were no vitals filed for this visit.    Subjective Assessment - 09/11/20 0806    Subjective Pt. had referral for Rt low back pain c spondylosis.  Pt.'s last MD note mentioned golf swing about 1 year ago having pain start up.  Pt. stated having complaints of Rt upper leg pain c prolonged sitting such as driving.  Pt. stated back pain has limited ability to perform golf, lifting, carrying.  Pt. stated resting helps after about 30 mins.    Pertinent History Rt femur liposarcoma c resection, HTN    Patient Stated Goals Reduce pain, playing golf, driving    Currently in Pain? Yes    Pain Score 0-No pain   pain 9/10 at worst   Pain Location Back    Pain Descriptors / Indicators Aching;Tightness    Pain Type Chronic pain    Pain Onset More than a month ago    Pain Frequency Intermittent    Aggravating Factors  lifting, carrying, golf    Pain Relieving Factors Rest     Effect of Pain on Daily Activities Golf, housework    Multiple Pain Sites Yes    Pain Score 0   pain at worst 10/10   Pain Location Leg    Pain Orientation Right;Posterior    Pain Descriptors / Indicators Throbbing;Aching    Pain Type Chronic pain    Pain Onset More than a month ago    Pain Frequency Intermittent    Aggravating Factors  sitting, driving    Pain Relieving Factors getting up and moving around    Effect of Pain on Daily Activities Limited in driving              [facility] PT Assessment - 09/11/20 0001      Assessment   Medical Diagnosis Chronic LBP, spondylosis    Referring Provider (PT) Dr. Ernestina Patches    Onset Date/Surgical Date 09/12/19    Hand Dominance Right      Precautions   Precaution Comments Parkinson's history      Restrictions   Weight Bearing Restrictions No      Balance Screen   Has the patient fallen in the past 6 months No      [place]  residence    Home Access Stairs to enter    [employer] of Steps 2      Prior Function   Level of Independence Independent      Cognition   Overall Cognitive Status Within Functional Limits for tasks assessed      Sensation   Light Touch Appears Intact      Posture/Postural Control   Posture Comments Lt lateral trunk shift evident, Lt iliac crest slightly lower than Rt in standing      ROM / Strength   AROM / PROM / Strength Strength;PROM;AROM      AROM   AROM Assessment Site Lumbar;Hip    Right/Left Hip Left;Right    Lumbar Flexion movement to patella, Rt sided back pulling   Minimal lumbar mobility noted   Lumbar Extension 25 % WFL, ERP reported in Rt upper leg, back tightness, REIS x 5 improved movement to 50%, ERP Rt upper leg      PROM   PROM Assessment Site Hip    Right/Left Hip Left;Right    Right Hip External Rotation  30    Right Hip Internal Rotation  15    Left Hip External Rotation  45    Left Hip Internal Rotation  25      Strength     Strength Assessment Site Ankle;Knee;Hip    Right/Left Hip Left;Right    Right Hip Flexion 5/5    Left Hip Flexion 5/5    Right/Left Knee Left;Right    Right Knee Flexion 5/5    Right Knee Extension 5/5    Left Knee Flexion 5/5    Left Knee Extension 5/5    Right/Left Ankle Left;Right    Right Ankle Dorsiflexion 5/5    Left Ankle Dorsiflexion 5/5      Special Tests   Other special tests (-) Slump bilateral.  Lt true leg length 35.75 inch, Rt 36 inch      Transfers   Comments Unable to perform sit to stand from 18 inch chair s UE assist      Ambulation/Gait   Gait Comments Reduced step length bilateral, mild forward trunk lean (mild Parkinson's gait presentation)                      Objective measurements completed on examination: See above findings.       [facility] Adult PT Treatment/Exercise - 09/11/20 0001      Exercises   Exercises Lumbar;Knee/Hip;Other Exercises    Other Exercises  HEP instruction/performance per handout c cues for techniques   Given Lt heel lift to correct iliac crest alignment     Lumbar Exercises: Stretches   Lower Trunk Rotation 3 reps   15 sec x 3 bilateral   Other Lumbar Stretch Exercise single knee to opposite shoulder c belt 15 sec x 3 Rt LE      Lumbar Exercises: Standing   Other Standing Lumbar Exercises lateral shift correction c wall to Lt shoulder 2 seconds x 5      Lumbar Exercises: Supine   Pelvic Tilt 5 seconds;5 reps   posterior    Bridge 5 reps      Manual Therapy   Manual therapy comments manual lateral shift correction x 12 c various hold times                   PT Education - 09/11/20 0800    Education Details HEP, POC, shoe lift    Person(s) Educated  Patient    Methods Explanation;Demonstration;Verbal cues;Handout    Comprehension Returned demonstration;Verbalized understanding            PT Short Term Goals - 09/11/20 0800      PT SHORT TERM GOAL #1   Title Patient will demonstrate  independent use of home exercise program to maintain progress from in clinic treatments.    Time 2    Period Weeks    Status New    Target Date 09/25/20             PT Long Term Goals - 09/11/20 0800      PT LONG TERM GOAL #1   Title Patient will demonstrate/report pain at worst less than or equal to 2/10 to facilitate minimal limitation in daily activity secondary to pain symptoms.    Time 8    Period Weeks    Status New    Target Date 11/06/20      PT LONG TERM GOAL #2   Title Patient will demonstrate independent use of home exercise program to facilitate ability to maintain/progress functional gains from skilled physical therapy services.    Time 8    Period Weeks    Status New    Target Date 11/06/20      PT LONG TERM GOAL #3   Title Pt. will demonstrate lumbar ext AROM > 75% WFL to facilitate upright standing, walking posture at PLOF s limitation.    Time 8    Period Weeks    Status New    Target Date 11/06/20      PT LONG TERM GOAL #4   Title Pt. will demonstrate upright standing posture s lateral shift, forward shift to facilitate upright standing/walking posture for daily activity.    Time 8    Period Weeks    Status New    Target Date 11/06/20      PT LONG TERM GOAL #5   Title Pt. will demonstrate ability to return to golf activity.    Time 8    Period Weeks    Status New    Target Date 11/06/20                  Plan - 09/11/20 0758    Clinical Impression Statement Patient is a 79 y.o. male who comes to clinic with complaints of low back pain with mobility, strength and movement coordination deficits that impair their ability to perform usual daily and recreational functional activities without increase difficulty/symptoms at this time.  Patient to benefit from skilled PT services to address impairments and limitations to improve to previous level of function without restriction secondary to condition.    Personal Factors and Comorbidities  Comorbidity 2    Comorbidities Rt femur liposarcoma c resection, HTN    Examination-Activity Limitations Sit;Squat;Stairs;Stand;Transfers;Lift    Examination-Participation Restrictions Community Activity;Yard Work;Other   lifting/carrying at home, golf   Stability/Clinical Decision Making Stable/Uncomplicated    Clinical Decision Making Low    Rehab Potential Good    PT Frequency --   1-2x/week   PT Duration 8 weeks    PT Treatment/Interventions ADLs/Self Care Home Management;Electrical Stimulation;Cryotherapy;Therapeutic activities;Iontophoresis 4mg /ml Dexamethasone;Balance training;Traction;Therapeutic exercise;Moist Heat;Functional mobility training;Stair training;Gait training;Ultrasound;Neuromuscular re-education;Patient/family education;Manual techniques;Dry needling;Passive range of motion;Spinal Manipulations;Joint Manipulations    PT Next Visit Plan Reassess shoe lift, lateral shift. Lumbar mobility gains, activity tolerance increase.    PT Home Exercise Plan [email or web address]    Consulted and Agree with Plan of Care Patient  Patient will benefit from skilled therapeutic intervention in order to improve the following deficits and impairments:  Abnormal gait, Decreased endurance, Hypomobility, Decreased activity tolerance, Decreased strength, Pain, Decreased mobility, Difficulty walking, Improper body mechanics, Postural dysfunction, Impaired flexibility, Decreased coordination, Decreased range of motion, Impaired perceived functional ability  Visit Diagnosis: Chronic bilateral low back pain with right-sided sciatica  Pain in right leg  Abnormal posture  Difficulty in walking, not elsewhere classified     Problem List Patient Active Problem List   Diagnosis Date Noted  . Memory disorder 09/30/2019  . Deep venous thrombosis ([place]) 12/16/2018  . Lipoma of right lower extremity 05/20/2017  . Parkinson disease ([place]) 12/03/2016  . BPH (benign prostatic hyperplasia) 06/16/2013   . HTN (hypertension) 06/16/2013  . Obesity (BMI 30-39.9) 06/16/2013  . Malignant neoplasm of connective and soft tissue of lower limb, including hip 06/14/2013  . Lipoma 06/02/2012  . 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 [place], [place], [address] Phone: [phone]   Fax:  [phone]  Name: Jacob Bolton MRN: [id number] Date of Birth: [birth year]

## 2020-09-11 NOTE — Patient Instructions (Signed)
Access Code: [email or web address] URL: [email or web address] Date: 09/11/2020 Prepared by: Scot Jun  Exercises Left Standing Lateral Shift Correction at [place] - 2 x daily - 7 x weekly - 1 sets - 10 reps - 2-3 hold Supine Piriformis Stretch with Foot on Ground - 2 x daily - 7 x weekly - 5 sets - 30 reps Supine Lower Trunk Rotation - 2 x daily - 7 x weekly - 5 reps - 1 sets - 15 hold Supine Posterior Pelvic Tilt - 2 x daily - 7 x weekly - 3 sets - 10 reps

## 2020-09-12 DIAGNOSIS — L821 Other seborrheic keratosis: Secondary | ICD-10-CM | POA: Diagnosis not present

## 2020-09-12 DIAGNOSIS — D225 Melanocytic nevi of trunk: Secondary | ICD-10-CM | POA: Diagnosis not present

## 2020-09-12 DIAGNOSIS — D692 Other nonthrombocytopenic purpura: Secondary | ICD-10-CM | POA: Diagnosis not present

## 2020-09-14 ENCOUNTER — Other Ambulatory Visit: Payer: Self-pay

## 2020-09-14 ENCOUNTER — Ambulatory Visit: Payer: PPO | Admitting: Rehabilitative and Restorative Service Providers"

## 2020-09-14 ENCOUNTER — Encounter: Payer: Self-pay | Admitting: Rehabilitative and Restorative Service Providers"

## 2020-09-14 DIAGNOSIS — R293 Abnormal posture: Secondary | ICD-10-CM | POA: Diagnosis not present

## 2020-09-14 DIAGNOSIS — M79604 Pain in right leg: Secondary | ICD-10-CM

## 2020-09-14 DIAGNOSIS — M5441 Lumbago with sciatica, right side: Secondary | ICD-10-CM

## 2020-09-14 DIAGNOSIS — R262 Difficulty in walking, not elsewhere classified: Secondary | ICD-10-CM

## 2020-09-14 DIAGNOSIS — G8929 Other chronic pain: Secondary | ICD-10-CM

## 2020-09-14 NOTE — Therapy (Signed)
[place], [place], [address] Phone: [phone]   Fax:  [phone]  Physical Therapy Treatment  Patient Details  Name: Jacob Bolton MRN: [id number] Date of Birth: 1941/03/30 Referring Provider (PT): Dr. Ernestina Patches   Encounter Date: 09/14/2020   PT End of Session - 09/14/20 1025    Visit Number 2    Number of Visits 12    Date for PT Re-Evaluation 11/06/20    Progress Note Due on Visit 10    PT Start Time 1016    PT Stop Time 1055    PT Time Calculation (min) 39 min    Activity Tolerance Patient tolerated treatment well    Behavior During Therapy [name] for tasks assessed/performed           Past Medical History:  Diagnosis Date  . High blood pressure   . Memory disorder 09/30/2019  . Parkinson disease ([place]) 12/03/2016    Past Surgical History:  Procedure Laterality Date  . CERVICAL DISC SURGERY  2007   Ruptured disc, plates and screws  . COLONOSCOPY  2008  . 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ides [facility]    There were no vitals filed for this visit.   Subjective Assessment - 09/14/20 1024    Subjective Pt. indicated back a little sore today, 1/10. Pt. indicated he felt like he was making some improvements.    Pertinent History Rt femur liposarcoma c resection, HTN    Patient Stated Goals Reduce pain, playing golf, driving    Currently in Pain? Yes    Pain Score 1     Pain Location Back    Pain Orientation Right    Pain Onset More than a month ago    Pain Score 0    Pain Orientation Right;Posterior    Pain Onset More than a month ago                             [facility] Adult PT Treatment/Exercise - 09/14/20 0001      Lumbar Exercises: Stretches   Passive Hamstring Stretch 5 reps;Right   15 sec x 5   Lower Trunk Rotation 5 reps   15 seconds x 5 bilateral     Lumbar Exercises: Aerobic   Nustep Lvl 5 10 mins      Lumbar Exercises: Supine    Bridge Compliant;20 reps    Other Supine Lumbar Exercises supine pball press down 10 sec x 10                    PT Short Term Goals - 09/11/20 0800      PT SHORT TERM GOAL #1   Title Patient will demonstrate independent use of home exercise program to maintain progress from in clinic treatments.    Time 2    Period Weeks    Status New    Target Date 09/25/20             PT Long Term Goals - 09/11/20 0800      PT LONG TERM GOAL #1   Title Patient will demonstrate/report pain at worst less than or equal to 2/10 to facilitate minimal limitation in daily activity secondary to pain symptoms.    Time 8    Period Weeks    Status New    Target Date 11/06/20      PT LONG TERM  GOAL #2   Title Patient will demonstrate independent use of home exercise program to facilitate ability to maintain/progress functional gains from skilled physical therapy services.    Time 8    Period Weeks    Status New    Target Date 11/06/20      PT LONG TERM GOAL #3   Title Pt. will demonstrate lumbar ext AROM > 75% WFL to facilitate upright standing, walking posture at PLOF s limitation.    Time 8    Period Weeks    Status New    Target Date 11/06/20      PT LONG TERM GOAL #4   Title Pt. will demonstrate upright standing posture s lateral shift, forward shift to facilitate upright standing/walking posture for daily activity.    Time 8    Period Weeks    Status New    Target Date 11/06/20      PT LONG TERM GOAL #5   Title Pt. will demonstrate ability to return to golf activity.    Time 8    Period Weeks    Status New    Target Date 11/06/20                 Plan - 09/14/20 1039    Clinical Impression Statement Cues required for HEP recall but overall fair to good performance.  Pt. demonstrated mild improvement in upright posture, c reduced lateral shift but still present.  Pt. may continue to benefit from skilled PT services to improve HEP, progress mobility/posture.     Personal Factors and Comorbidities Comorbidity 2    Comorbidities Rt femur liposarcoma c resection, HTN    Examination-Activity Limitations Sit;Squat;Stairs;Stand;Transfers;Lift    Examination-Participation Restrictions Community Activity;Yard Work;Other   lifting/carrying at home, golf   Stability/Clinical Decision Making Stable/Uncomplicated    Rehab Potential Good    PT Frequency --   1-2x/week   PT Duration 8 weeks    PT Treatment/Interventions ADLs/Self Care Home Management;Electrical Stimulation;Cryotherapy;Therapeutic activities;Iontophoresis 4mg /ml Dexamethasone;Balance training;Traction;Therapeutic exercise;Moist Heat;Functional mobility training;Stair training;Gait training;Ultrasound;Neuromuscular re-education;Patient/family education;Manual techniques;Dry needling;Passive range of motion;Spinal Manipulations;Joint Manipulations    PT Next Visit Plan Continue c lateral shift correction, lumbar/LE mobility gains.    PT Home Exercise Plan [email or web address]    Consulted and Agree with Plan of Care Patient           Patient will benefit from skilled therapeutic intervention in order to improve the following deficits and impairments:  Abnormal gait, Decreased endurance, Hypomobility, Decreased activity tolerance, Decreased strength, Pain, Decreased mobility, Difficulty walking, Improper body mechanics, Postural dysfunction, Impaired flexibility, Decreased coordination, Decreased range of motion, Impaired perceived functional ability  Visit Diagnosis: Chronic bilateral low back pain with right-sided sciatica  Pain in right leg  Abnormal posture  Difficulty in walking, not elsewhere classified     Problem List Patient Active Problem List   Diagnosis Date Noted  . Memory disorder 09/30/2019  . Deep venous thrombosis ([place]) 12/16/2018  . Lipoma of right lower extremity 05/20/2017  . Parkinson disease ([place]) 12/03/2016  . BPH (benign prostatic hyperplasia) 06/16/2013  . HTN  (hypertension) 06/16/2013  . Obesity (BMI 30-39.9) 06/16/2013  . Malignant neoplasm of connective and soft tissue of lower limb, including hip 06/14/2013  . Lipoma 06/02/2012  . 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 [place], [place], [address] Phone: [phone]   Fax:  [phone]  Name:  Jacob Bolton MRN: [id number] Date of Birth: October 15, 1941

## 2020-09-22 ENCOUNTER — Other Ambulatory Visit: Payer: Self-pay | Admitting: Physical Medicine and Rehabilitation

## 2020-09-22 NOTE — Telephone Encounter (Signed)
Please advise 

## 2020-09-26 ENCOUNTER — Ambulatory Visit: Payer: PPO | Admitting: Rehabilitative and Restorative Service Providers"

## 2020-09-26 ENCOUNTER — Other Ambulatory Visit: Payer: Self-pay

## 2020-09-26 ENCOUNTER — Encounter: Payer: Self-pay | Admitting: Rehabilitative and Restorative Service Providers"

## 2020-09-26 DIAGNOSIS — M5441 Lumbago with sciatica, right side: Secondary | ICD-10-CM | POA: Diagnosis not present

## 2020-09-26 DIAGNOSIS — M79604 Pain in right leg: Secondary | ICD-10-CM | POA: Diagnosis not present

## 2020-09-26 DIAGNOSIS — R293 Abnormal posture: Secondary | ICD-10-CM | POA: Diagnosis not present

## 2020-09-26 DIAGNOSIS — G8929 Other chronic pain: Secondary | ICD-10-CM

## 2020-09-26 DIAGNOSIS — R262 Difficulty in walking, not elsewhere classified: Secondary | ICD-10-CM

## 2020-09-26 NOTE — Patient Instructions (Signed)
Access Code: [email or web address] URL: [email or web address] Date: 09/26/2020 Prepared by: Vista Mink  Exercises Standing Lumbar Extension at [place] 5 x daily - 7 x weekly - 1 sets - 5 reps - 3 seconds hold Standing Scapular Retraction - 5 x daily - 7 x weekly - 1 sets - 5 reps - 5 hold Heel Toe Raises with Counter Support - 2 x daily - 7 x weekly - 1 sets - 10 reps - 3 seconds hold Sit to Stand with Counter Support - 2 x daily - 7 x weekly - 1 sets - 5 reps

## 2020-09-26 NOTE — Therapy (Signed)
[facility] [address] [place], [place], [address] Phone: [phone]   Fax:  [phone]  Physical Therapy Treatment  Patient Details  Name: Jacob Bolton MRN: [id number] Date of Birth: 10/19/1941 Referring Provider (PT): Dr. Ernestina Patches   Encounter Date: 09/26/2020   PT End of Session - 09/26/20 1633    Visit Number 3    Number of Visits 12    Date for PT Re-Evaluation 11/06/20    Progress Note Due on Visit 10    PT Start Time 0900    PT Stop Time 0945    PT Time Calculation (min) 45 min    Activity Tolerance Patient tolerated treatment well;No increased pain    Behavior During Therapy WFL for tasks assessed/performed           Past Medical History:  Diagnosis Date  . High blood pressure   . Memory disorder 09/30/2019  . Parkinson disease ([place]) 12/03/2016    Past Surgical History:  Procedure Laterality Date  . CERVICAL DISC SURGERY  2007   Ruptured disc, plates and screws  . COLONOSCOPY  2008  . 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 Leonides Schanz, [facility]    There were no vitals filed for this visit.   Subjective Assessment - 09/26/20 1630    Subjective Jacob Bolton reports compliance with his early HEP.  Weakness remains an issue.    Pertinent History Rt femur liposarcoma c resection, HTN    Patient Stated Goals Reduce pain, playing golf, driving    Currently in Pain? Yes    Pain Score 2     Pain Location Back    Pain Orientation Right    Pain Descriptors / Indicators Aching;Tightness    Pain Type Chronic pain    Pain Onset More than a month ago    Pain Frequency Intermittent    Aggravating Factors  Prolonged postures    Pain Relieving Factors Rest and change of posture    Effect of Pain on Daily Activities Unable to help around the house as he would like.  Unable to golf.    Multiple Pain Sites No    Pain Onset More than a month ago                             [facility]  Treatment/Exercise - 09/26/20 0001      Posture/Postural Control   Posture/Postural Control Postural limitations    Postural Limitations Forward head;Rounded Shoulders;Decreased lumbar lordosis      Therapeutic Activites    Therapeutic Activities ADL's    ADL's Log roll, posture, frequent change of position, walking prescription, basic body mechanics 8 minutes      Exercises   Exercises Lumbar;Knee/Hip      Lumbar Exercises: Stretches   Active Hamstring Stretch Left;Right;5 reps;20 seconds    Lower Trunk Rotation --   10X 5 seconds each direction     Lumbar Exercises: Standing   Heel Raises 10 reps;3 seconds   Heel to toe raises with transversus abdominus activation   Scapular Retraction Strengthening;10 reps   5 seconds shoulder blades pinch   Other Standing Lumbar Exercises Trunk extension AROM 10X 3 seconds    Other Standing Lumbar Exercises Shoulder blade pinches 10X 5 seconds      Lumbar Exercises: Seated   Sit to Stand 5 reps   2 sets from high table with slow eccentrics  Lumbar Exercises: Supine   Bridge 10 reps;5 seconds                  PT Education - 09/26/20 1632    Education Details Reviewed, updated and progressed HEP.  Discussed basic body mechanics and reviewed postural education.    Person(s) Educated Patient    Methods Explanation;Demonstration;Verbal cues;Handout    Comprehension Verbalized understanding;Returned demonstration;Need further instruction;Verbal cues required            PT Short Term Goals - 09/26/20 1633      PT SHORT TERM GOAL #1   Title Patient will demonstrate independent use of home exercise program to maintain progress from in clinic treatments.    Time 2    Period Weeks    Status On-going    Target Date 09/25/20             PT Long Term Goals - 09/26/20 1633      PT LONG TERM GOAL #1   Title Patient will demonstrate/report pain at worst less than or equal to 2/10 to facilitate minimal limitation in daily  activity secondary to pain symptoms.    Time 8    Period Weeks    Status On-going      PT LONG TERM GOAL #2   Title Patient will demonstrate independent use of home exercise program to facilitate ability to maintain/progress functional gains from skilled physical therapy services.    Time 8    Period Weeks    Status On-going      PT LONG TERM GOAL #3   Title Pt. will demonstrate lumbar ext AROM > 75% WFL to facilitate upright standing, walking posture at PLOF s limitation.    Time 8    Period Weeks    Status On-going      PT LONG TERM GOAL #4   Title Pt. will demonstrate upright standing posture s lateral shift, forward shift to facilitate upright standing/walking posture for daily activity.    Time 8    Period Weeks    Status Partially Met      PT LONG TERM GOAL #5   Title Pt. will demonstrate ability to return to golf activity.    Time 8    Period Weeks    Status On-going                 Plan - 09/26/20 1050    Clinical Impression Statement We progressed Jacob Bolton's strength activities today and spent some time reviewing posture and body mechanics.  Both areas will be a focus of future visits.  Prognosis remains good with continued work.    Personal Factors and Comorbidities Comorbidity 2    Comorbidities Rt femur liposarcoma c resection, HTN    Examination-Activity Limitations Sit;Squat;Stairs;Stand;Transfers;Lift    Examination-Participation Restrictions Community Activity;Yard Work;Other   lifting/carrying at home, golf   Stability/Clinical Decision Making Stable/Uncomplicated    Rehab Potential Good    PT Frequency 2x / week   1-2x/week   PT Duration 8 weeks    PT Treatment/Interventions ADLs/Self Care Home Management;Electrical Stimulation;Cryotherapy;Therapeutic activities;Iontophoresis 68m/ml Dexamethasone;Balance training;Traction;Therapeutic exercise;Moist Heat;Functional mobility training;Stair training;Gait training;Ultrasound;Neuromuscular  re-education;Patient/family education;Manual techniques;Dry needling;Passive range of motion;Spinal Manipulations;Joint Manipulations    PT Next Visit Plan Strength and body mechanics work.    PT Home Exercise Plan [email or web address] Additions:Access Code: [email or web address]   Consulted and Agree with Plan of Care Patient           Patient will benefit from skilled therapeutic intervention  in order to improve the following deficits and impairments:  Abnormal gait, Decreased endurance, Hypomobility, Decreased activity tolerance, Decreased strength, Pain, Decreased mobility, Difficulty walking, Improper body mechanics, Postural dysfunction, Impaired flexibility, Decreased coordination, Decreased range of motion, Impaired perceived functional ability  Visit Diagnosis: Chronic bilateral low back pain with right-sided sciatica  Difficulty in walking, not elsewhere classified  Abnormal posture  Pain in right leg     Problem List Patient Active Problem List   Diagnosis Date Noted  . Memory disorder 09/30/2019  . Deep venous thrombosis ([place]) 12/16/2018  . Lipoma of right lower extremity 05/20/2017  . Parkinson disease ([place]) 12/03/2016  . BPH (benign prostatic hyperplasia) 06/16/2013  . HTN (hypertension) 06/16/2013  . Obesity (BMI 30-39.9) 06/16/2013  . Malignant neoplasm of connective and soft tissue of lower limb, including hip 06/14/2013  . Lipoma 06/02/2012  . Neoplasm of uncertain behavior of connective and other soft tissue 06/02/2012    Farley Ly PT, MPT 09/26/2020, 4:38 PM  [facility] [address] [place], [place], [address] Phone: [phone]   Fax:  [phone]  Name: Jacob Bolton MRN: [id number] Date of Birth: 09-22-1941

## 2020-09-27 ENCOUNTER — Ambulatory Visit (INDEPENDENT_AMBULATORY_CARE_PROVIDER_SITE_OTHER): Payer: PPO | Admitting: Physical Medicine and Rehabilitation

## 2020-09-27 ENCOUNTER — Encounter: Payer: Self-pay | Admitting: Physical Medicine and Rehabilitation

## 2020-09-27 VITALS — BP 109/69 | HR 73

## 2020-09-27 DIAGNOSIS — G8929 Other chronic pain: Secondary | ICD-10-CM

## 2020-09-27 DIAGNOSIS — M5441 Lumbago with sciatica, right side: Secondary | ICD-10-CM | POA: Diagnosis not present

## 2020-09-27 DIAGNOSIS — M4306 Spondylolysis, lumbar region: Secondary | ICD-10-CM

## 2020-09-27 DIAGNOSIS — C4921 Malignant neoplasm of connective and soft tissue of right lower limb, including hip: Secondary | ICD-10-CM | POA: Diagnosis not present

## 2020-09-27 DIAGNOSIS — M25551 Pain in right hip: Secondary | ICD-10-CM | POA: Diagnosis not present

## 2020-09-27 NOTE — Progress Notes (Signed)
Jacob Bolton - 79 y.o. male MRN [id number]  Date of birth: Jul 29, 1941  Office Visit Note: Visit Date: 08/30/2020 PCP: Hulan Fess, MD Referred by: Hulan Fess, MD  Subjective: Chief Complaint  Patient presents with  . Lower Back - Pain  . Right Leg - Pain   HPI: Jacob Bolton is a 79 y.o. male who comes in today For new patient evaluation management of chronic worsening severe right-sided low back pain.  He rates his pain as an 8 out of 10.  He does get some pain in the right leg but his main issue is the right lower back.  He reports this is really worse with trying to swing a golf club which he like to do and he is trying to stay active. 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  Managed by Dr. Magda Bernheim at the cancer center [facility].  All of those notes reviewed.  Patient did have surgery about 6 months ago for this tumor on the right.  He has not had MRI of the lumbar spine recently.  He has had multiple MRIs of the hip and thigh and femur.  The last MRI is detailed below in the report.  Patient has had no recent x-rays of the lumbar spine.  He has not had recent physical therapy or chiropractic care.  He has been taking some hydrocodone.  He will take ibuprofen somewhat as needed.  He is known specific anti-inflammatory.  He denies any specific weakness.  No paresthesia.  Patient's case also complicated by Parkinson's disease.  He does take levodopa.  Review of Systems  Musculoskeletal: Positive for back pain and joint pain.  All other systems reviewed and are negative.  Otherwise per HPI.  Assessment & Plan: Visit Diagnoses:  1. Chronic right-sided low back pain with right-sided sciatica   2. Spondylosis without myelopathy or radiculopathy, lumbar region   3. Parkinson disease ([place])   4. Scoliosis of lumbar spine, unspecified scoliosis type   5. Sarcoma of lower extremity, left ([place])     Plan: Findings:  Chronic  worsening severe right-sided axial low back pain worse with twisting and standing and extension.  Exam is pretty consistent with facet arthropathy as a source of pain.  His x-ray also seems to indicate more arthritic changes but he does have some degenerative changes as well.  I am not necessarily keen on MRI of the lumbar spine yet.  I will be quick to order one though based on his previous history of sarcoma lower extremity.  He is having right-sided lower pain and the sarcoma was on the left.  I think at this point I will order meloxicam to be taken for the next 30 days along with physical therapy just to see if they can get him moving better and maybe work on focal trigger points is found.  Depending on relief with that we will either look at MRI of the lumbar spine or potentially facet joint blocks diagnostically.  Those facet joint injections would be done in a double block paradigm looking at perhaps radiofrequency ablation as a more longer term treatment.  This is all be done as part of a comprehensive pain management approach with access to physical therapy spine surgery and biopsychosocial support with psychology.    Meds & Orders:  Meds ordered this encounter  Medications  . DISCONTD: meloxicam (MOBIC) 15 MG tablet    Sig: Take 1 tablet (15 mg  total) by mouth daily. Take with food    Dispense:  30 tablet    Refill:  0    Orders Placed This Encounter  Procedures  . XR Lumbar Spine 2-3 Views  . Ambulatory referral to Physical Therapy    Follow-up: Return in about 4 weeks (around 09/27/2020).   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   FINDINGS:   Status post surgical changes of debulking of recurrent liposarcoma in the medial compartment left thigh. Similar scattered areas of subcutaneous scarring along the anterior, posterior, and medial thigh as well as scattered areas of susceptibility artifact consistent with previous surgery.   The remaining heterogeneous fatty masses within the posterior compartment of the right thigh have increased in size compared to 12/28/2019.   Index lesions described on the prior study measure as follows:  Superior lesion measures 3.7 x2.7 cm (series 20, image 5), previously 2.9 x 2.7 cm.  Inferior lesion measures 3.9 x 2.3 cm (series 20, image 18), previously 3.3 x 2.5 cm   Low heterogeneous T1 and T2 signal nodular masslike area in the abductor compartment, similar to prior.   Similar fluid signal intensity collection along the fascial plane between the anterior, medial, and posterior compartments of the thigh. Similar lipomatous hypertrophy of the sciatic nerve. Severe muscle atrophy involving most of the medial and posterior compartment thigh musculature. There is sparing involving portions of the semitendinosus and biceps femoris long head muscles.   Persistent mild trochanteric bursitis.   Similar right inguinal lymph nodes with rounded morphology, concerning for metastatic lesions.   Additional comments: Prostatomegaly.    CONCLUSION:   1. Status post surgical changes of medial compartment debulking of a recurrent right thigh liposarcoma as above.  2. The remaining fat-containing lesions in the posterior compartment of the right thigh have increase in size compared to 12/28/2019.  3. Nodular masslike area in the abductor compartment with low heterogeneous signal is similar to prior and likely represents a fibrous area or postsurgical scar. If there is concern fordedifferentiated neoplasm, recommend MRI with contrast to better evaluate this  area. Specimen Collected: 06/27/20 1:25   He reports that he has quit smoking. He has never used smokeless tobacco. No results for input(s): HGBA1C, LABURIC in the last 8760 hours.  Objective:  VS:  HT:    WT:   BMI:     BP:103/62  HR:91bpm  TEMP: ( )  RESP:  Physical Exam Vitals and nursing note reviewed.  Constitutional:      General: He is not in acute distress.    Appearance: Normal appearance. He is well-developed. He is obese.  HENT:     Head: Normocephalic and atraumatic.  Eyes:     Conjunctiva/sclera: Conjunctivae normal.     Pupils: Pupils are equal, round, and reactive to light.  Cardiovascular:     Rate and Rhythm: Normal rate.     Pulses: Normal pulses.     Heart sounds: Normal heart sounds.  Pulmonary:     Effort: Pulmonary effort is normal. No respiratory distress.  Musculoskeletal:     Cervical back: Normal range of motion and neck supple. No rigidity.  Right lower leg: No edema.     Left lower leg: No edema.     Comments: Patient somewhat slow to rise from a seated position to full extension.  There is concordant low back pain with facet loading and lumbar spine extension rotation.  There are no definitive trigger points but the patient is somewhat tender across the lower back and PSIS.  There is no pain with hip rotation.  Patient has good distal strength without clonus.  Skin:    General: Skin is warm and dry.     Findings: No erythema or rash.  Neurological:     General: No focal deficit present.     Mental Status: He is alert and oriented to person, place, and time.     Sensory: No sensory deficit.     Coordination: Coordination normal.     Gait: Gait normal.  Psychiatric:        Mood and Affect: Mood normal.        Behavior: Behavior normal.     Ortho Exam  Imaging: AP and lateral lumbar x-ray: AP and lateral lumbar spine shows mild rotatory scoliosis with facet arthropathy on the right at L3-4 and left L4-5 and L5-S1.  There is perhaps  small listhesis of L5 on S1 with degenerative disc height loss.  Bilateral hips are visualized with mild to moderate right and mild left arthritis.  Past Medical/Family/Surgical/Social History: Medications & Allergies reviewed per EMR, new medications updated. Patient Active Problem List   Diagnosis Date Noted  . Memory disorder 09/30/2019  . Deep venous thrombosis ([place]) 12/16/2018  . Lipoma of right lower extremity 05/20/2017  . Parkinson disease ([place]) 12/03/2016  . BPH (benign prostatic hyperplasia) 06/16/2013  . HTN (hypertension) 06/16/2013  . Obesity (BMI 30-39.9) 06/16/2013  . Sarcoma of lower extremity, left ([place]) 06/14/2013  . Lipoma 06/02/2012  . 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 High blood pressure   . Memory disorder 09/30/2019  . Parkinson disease ([place]) 12/03/2016   Family History  Problem Relation Age of Onset  . High blood pressure Mother   . Anxiety disorder Mother   . High blood pressure Father   . Heart attack Father    Past Surgical History:  Procedure Laterality Date  . CERVICAL DISC SURGERY  2007   Ruptured disc, plates and screws  . COLONOSCOPY  2008  . 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 Leonides Schanz, [place] History  . Occupation: Retired  Tobacco Use  . Smoking status: Former Research scientist (life sciences)  . Smokeless tobacco: Never Used  Substance and Sexual Activity  . Alcohol use: Yes    Alcohol/week: 3.0 standard drinks    Types: 3 Glasses of wine per week    Comment: WIne 2-3 nights per week  . Drug use: No  . Sexual activity: Not on file

## 2020-09-27 NOTE — Progress Notes (Signed)
Doing well with PT. Continued right sided low back pain.  Numeric Pain Rating Scale and Functional Assessment Average Pain 9 Pain Right Now 0 My pain is intermittent, dull and aching Pain is worse with: some activites Pain improves with: rest   In the last MONTH (on 0-10 scale) has pain interfered with the following?  1. General activity like being  able to carry out your everyday physical activities such as walking, climbing stairs, carrying groceries, or moving a chair?  Rating(1)  2. Relation with others like being able to carry out your usual social activities and roles such as  activities at home, at work and in your community. Rating(1)  3. Enjoyment of life such that you have  been bothered by emotional problems such as feeling anxious, depressed or irritable?  Rating(10)

## 2020-09-27 NOTE — Progress Notes (Signed)
Jacob Bolton - 79 y.o. male MRN [id number]  Date of birth: 08-29-1941  Office Visit Note: Visit Date: 09/27/2020 PCP: Hulan Fess, MD Referred by: Hulan Fess, MD  Subjective: Chief Complaint  Patient presents with  . Lower Back - Pain   HPI: Jacob Bolton is a 79 y.o. male who comes in today For evaluation and management of chronic worsening recalcitrant right axial low back pain.  He has some referral pain into the thigh or some thigh pain in general.  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  This sarcoma is managed by Dr. Magda Bernheim at [facility].  Since I have seen the patient last he has completed a course of physical therapy and is still really undergoing some physical therapy.  He reports initially that this seemed to help quite a bit and then with time away from therapist and with movement he gets worsening symptoms again.  He is a somewhat difficult historian.  His wife is present and she gives some of the history.  He reports his average pain is a 9 but he says it really does not limit a lot of his activities but he would like to swing a golf club he has this right-sided low back pain.  Its intermittent dull and aching better at rest.  He has tried meloxicam that we provided really could not tell as much difference with that.  We completed x-rays the last time we saw him showing some facet arthropathy on the right at L3-4 and more on the left at L4-5 and L5-S1.  There was some degenerative changes.  No new symptoms since I seen him last.  No medication changes or trauma etc.  Still no focal weakness.  Review of Systems  Musculoskeletal: Positive for back pain and joint pain.  All other systems reviewed and are negative.  Otherwise per HPI.  Assessment & Plan: Visit Diagnoses:  1. Chronic right-sided low back pain with right-sided sciatica   2. Pain in right hip   3. Spondylolysis, lumbar  region   4. Soft tissue sarcoma of right thigh ([place])     Plan: Findings:  1.  Recalcitrant axial low back pain on the right with some pain across the lower back believed to be facet mediated low back pain complicated by probably some rigidity with the Parkinson's disease.  At this point having had completed anti-inflammatory course as well as physical therapy which is still somewhat ongoing and he is doing home exercises I think the best approach with his history of soft tissue sarcoma is to complet MRI of the lumbar spine.  Depending on what that shows my next follow-up would be diagnostic medial branch block of the facet joints.  If it did show something like a stenosis or disc issue we could look probably an epidural injection.  He will continue with current management otherwise.   2.  There was some talk today about the pain in his right thigh.  Unfortunately this is really out of what I can probably help him with.  He is followed for him an orthopedic standpoint by Dr. Redmond Pulling at [facility].  He has had the soft tissue sarcoma.  He has had surgery.  He will return to follow-up with him for further management.  If the MRI of the lumbar spine showed anything that could be a referral pattern into the hip then obviously we could  look at that.     Meds & Orders: No orders of the defined types were placed in this encounter.   Orders Placed This Encounter  Procedures  . MR LUMBAR SPINE WO CONTRAST    Follow-up: Return for MRI review after completion.   Procedures: No procedures performed  No notes on file   Clinical History: MRI RIGHT THIGH/FEMUR WITHOUT CONTRAST, 06/27/2020 12:19 PM   INDICATION: Soft tissue neoplasm, thigh, recurrence suspected \ soft tissue sarcoma f/u \ C49.21 Soft tissue sarcoma of right thigh ([place])  COMPARISON: 12/28/2019 - right thigh MR   TECHNIQUE: Multi-planar, multi-sequence MR imaging of the right thigh/femur was performed without contrast.   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 Similar scattered areas of subcutaneous scarring along the anterior, posterior, and medial thigh as well as scattered areas of susceptibility artifact consistent with previous surgery.   The remaining heterogeneous fatty masses within the posterior compartment of the right thigh have increased in size compared to 12/28/2019.   Index lesions described on the prior study measure as follows:  Superior lesion measures 3.7 x2.7 cm (series 20, image 5), previously 2.9 x 2.7 cm.  Inferior lesion measures 3.9 x 2.3 cm (series 20, image 18), previously 3.3 x 2.5 cm   Low heterogeneous T1 and T2 signal nodular masslike area in the abductor compartment, similar to prior.   Similar fluid signal intensity collection along the fascial plane between the anterior, medial, and posterior compartments of the thigh. Similar lipomatous hypertrophy of the sciatic nerve. Severe muscle atrophy involving most of the medial and posterior compartment thigh musculature. There is sparing involving portions of the semitendinosus and biceps femoris long head muscles.   Persistent mild trochanteric bursitis.   Similar right inguinal lymph nodes with rounded morphology, concerning for metastatic lesions.   Additional comments: Prostatomegaly.    CONCLUSION:   1. Status post surgical changes of medial compartment debulking of a recurrent right thigh liposarcoma as above.  2. The remaining fat-containing lesions in the posterior compartment of the right thigh have increase in size compared to 12/28/2019.  3. Nodular masslike area in the abductor compartment with low heterogeneous signal is similar to prior and likely represents a fibrous area or postsurgical scar. If there is concern fordedifferentiated neoplasm, recommend MRI with contrast to better evaluate this area. Specimen Collected: 06/27/20 1:25   He  reports that he has quit smoking. He has never used smokeless tobacco. No results for input(s): HGBA1C, LABURIC in the last 8760 hours.  Objective:  VS:  HT:    WT:   BMI:     BP:109/69  HR:73bpm  TEMP: ( )  RESP:  Physical Exam Constitutional:      General: He is not in acute distress.    Appearance: Normal appearance. He is not ill-appearing.  HENT:     Head: Normocephalic and atraumatic.     Right Ear: External ear normal.     Left Ear: External ear normal.  Eyes:     Extraocular Movements: Extraocular movements intact.  Cardiovascular:     Rate and Rhythm: Normal rate.     Pulses: Normal pulses.  Abdominal:     General: There is no distension.     Palpations: Abdomen is soft.  Musculoskeletal:        General: No tenderness or signs of injury.     Right lower leg: No edema.     Left lower  leg: No edema.     Comments: Patient has good distal strength without clonus.  Somewhat slow to rise from a seated position.  He has concordant pain with facet loading and extension.  No focal trigger points.  Some pain with hip movement on the right.  Skin:    Findings: No erythema or rash.  Neurological:     General: No focal deficit present.     Mental Status: He is alert and oriented to person, place, and time.     Sensory: No sensory deficit.     Motor: No weakness or abnormal muscle tone.     Coordination: Coordination normal.     Gait: Gait abnormal.     Comments: Mask like facies.  Negative cogwheeling.  Psychiatric:        Mood and Affect: Mood normal.        Behavior: Behavior normal.     Ortho Exam  Imaging: No results found.  Past Medical/Family/Surgical/Social History: Medications & Allergies reviewed per EMR, new medications updated. Patient Active Problem List   Diagnosis Date Noted  . Memory disorder 09/30/2019  . Deep venous thrombosis ([place]) 12/16/2018  . Lipoma of right lower extremity 05/20/2017  . Parkinson disease ([place]) 12/03/2016  . BPH (benign  prostatic hyperplasia) 06/16/2013  . HTN (hypertension) 06/16/2013  . Obesity (BMI 30-39.9) 06/16/2013  . Sarcoma of lower extremity, left ([place]) 06/14/2013  . Lipoma 06/02/2012  . 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 High blood pressure   . Memory disorder 09/30/2019  . Parkinson disease ([place]) 12/03/2016   Family History  Problem Relation Age of Onset  . High blood pressure Mother   . Anxiety disorder Mother   . High blood pressure Father   . Heart attack Father    Past Surgical History:  Procedure Laterality Date  . CERVICAL DISC SURGERY  2007   Ruptured disc, plates and screws  . COLONOSCOPY  2008  . 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 Leonides Schanz, [place] History  . Occupation: Retired  Tobacco Use  . Smoking status: Former Research scientist (life sciences)  . Smokeless tobacco: Never Used  Substance and Sexual Activity  . Alcohol use: Yes    Alcohol/week: 3.0 standard drinks    Types: 3 Glasses of wine per week    Comment: WIne 2-3 nights per week  . Drug use: No  . Sexual activity: Not on file

## 2020-09-28 ENCOUNTER — Encounter: Payer: Self-pay | Admitting: Rehabilitative and Restorative Service Providers"

## 2020-09-28 ENCOUNTER — Ambulatory Visit (INDEPENDENT_AMBULATORY_CARE_PROVIDER_SITE_OTHER): Payer: PPO | Admitting: Rehabilitative and Restorative Service Providers"

## 2020-09-28 ENCOUNTER — Other Ambulatory Visit: Payer: Self-pay

## 2020-09-28 DIAGNOSIS — M79604 Pain in right leg: Secondary | ICD-10-CM | POA: Diagnosis not present

## 2020-09-28 DIAGNOSIS — G8929 Other chronic pain: Secondary | ICD-10-CM | POA: Diagnosis not present

## 2020-09-28 DIAGNOSIS — R293 Abnormal posture: Secondary | ICD-10-CM

## 2020-09-28 DIAGNOSIS — M5441 Lumbago with sciatica, right side: Secondary | ICD-10-CM

## 2020-09-28 DIAGNOSIS — R262 Difficulty in walking, not elsewhere classified: Secondary | ICD-10-CM

## 2020-09-28 NOTE — Therapy (Signed)
[place], [place], [address] Phone: [phone]   Fax:  [phone]  Physical Therapy Treatment  Patient Details  Name: Jacob Bolton MRN: [id number] Date of Birth: [birth year] Referring Provider (PT): Dr. Ernestina Patches   Encounter Date: 09/28/2020   PT End of Session - 09/28/20 0847    Visit Number 4    Number of Visits 12    Date for PT Re-Evaluation 11/06/20    Progress Note Due on Visit 10    PT Start Time 0843    PT Stop Time 0922    PT Time Calculation (min) 39 min    Activity Tolerance Patient tolerated treatment well    Behavior During Therapy [facility] for tasks assessed/performed           Past Medical History:  Diagnosis Date  . High blood pressure   . Memory disorder 09/30/2019  . Parkinson disease ([place]) 12/03/2016    Past Surgical History:  Procedure Laterality Date  . CERVICAL DISC SURGERY  2007   Ruptured disc, plates and screws  . COLONOSCOPY  2008  . 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 Leonides Schanz, [facility]    There were no vitals filed for this visit.   Subjective Assessment - 09/28/20 0846    Subjective Pt. stated stiffness but nothing out of the ordinary.  Pt. stated no particular pain upon arrival today.    Pertinent History Rt femur liposarcoma c resection, HTN    Patient Stated Goals Reduce pain, playing golf, driving    Currently in Pain? No/denies    Pain Onset More than a month ago    Pain Score 0    Pain Onset More than a month ago                             [facility] Adult PT Treatment/Exercise - 09/28/20 0001      Lumbar Exercises: Stretches   Lower Trunk Rotation 5 reps   15 sec x 5 bilateral     Lumbar Exercises: Aerobic   Nustep lvl 5 10 mins      Lumbar Exercises: Standing   Row Strengthening;Both   3x 10   Theraband Level (Row) Level 3 (Green)    Shoulder Extension Strengthening;Theraband   3 x 10   Theraband Level (Shoulder  Extension) Level 3 (Green)      Lumbar Exercises: Seated   Sit to Stand 15 reps   no UE,      Lumbar Exercises: Supine   Bridge 20 reps;Compliant    Other Supine Lumbar Exercises supine pball press down 10 sec x 10                    PT Short Term Goals - 09/28/20 0910      PT SHORT TERM GOAL #1   Title Patient will demonstrate independent use of home exercise program to maintain progress from in clinic treatments.    Time 2    Period Weeks    Status Revised    Target Date 10/12/20             PT Long Term Goals - 09/26/20 1633      PT LONG TERM GOAL #1   Title Patient will demonstrate/report pain at worst less than or equal to 2/10 to facilitate minimal limitation in daily activity secondary to pain symptoms.    Time  8    Period Weeks    Status On-going      PT LONG TERM GOAL #2   Title Patient will demonstrate independent use of home exercise program to facilitate ability to maintain/progress functional gains from skilled physical therapy services.    Time 8    Period Weeks    Status On-going      PT LONG TERM GOAL #3   Title Pt. will demonstrate lumbar ext AROM > 75% WFL to facilitate upright standing, walking posture at PLOF s limitation.    Time 8    Period Weeks    Status On-going      PT LONG TERM GOAL #4   Title Pt. will demonstrate upright standing posture s lateral shift, forward shift to facilitate upright standing/walking posture for daily activity.    Time 8    Period Weeks    Status Partially Met      PT LONG TERM GOAL #5   Title Pt. will demonstrate ability to return to golf activity.    Time 8    Period Weeks    Status On-going                 Plan - 09/28/20 0909    Clinical Impression Statement Pt. to continue to benefit from skilled PT services to improve lumbar mobility and general strength for improved progressive mobility/transfers to promote reduced difficulty c daily activity.    Personal Factors and Comorbidities  Comorbidity 2    Comorbidities Rt femur liposarcoma c resection, HTN    Examination-Activity Limitations Sit;Squat;Stairs;Stand;Transfers;Lift    Examination-Participation Restrictions Community Activity;Yard Work;Other   lifting/carrying at home, golf   Stability/Clinical Decision Making Stable/Uncomplicated    Rehab Potential Good    PT Frequency 2x / week   1-2x/week   PT Duration 8 weeks    PT Treatment/Interventions ADLs/Self Care Home Management;Electrical Stimulation;Cryotherapy;Therapeutic activities;Iontophoresis 51m/ml Dexamethasone;Balance training;Traction;Therapeutic exercise;Moist Heat;Functional mobility training;Stair training;Gait training;Ultrasound;Neuromuscular re-education;Patient/family education;Manual techniques;Dry needling;Passive range of motion;Spinal Manipulations;Joint Manipulations    PT Next Visit Plan Lumbar/hip mobility, strengthening/endurance    PT Home Exercise Plan [email or web address] Additions:Access Code: [email or web address]   Consulted and Agree with Plan of Care Patient           Patient will benefit from skilled therapeutic intervention in order to improve the following deficits and impairments:  Abnormal gait, Decreased endurance, Hypomobility, Decreased activity tolerance, Decreased strength, Pain, Decreased mobility, Difficulty walking, Improper body mechanics, Postural dysfunction, Impaired flexibility, Decreased coordination, Decreased range of motion, Impaired perceived functional ability  Visit Diagnosis: Difficulty in walking, not elsewhere classified  Chronic bilateral low back pain with right-sided sciatica  Abnormal posture  Pain in right leg     Problem List Patient Active Problem List   Diagnosis Date Noted  . Memory disorder 09/30/2019  . Deep venous thrombosis ([place] 12/16/2018  . Lipoma of right lower extremity 05/20/2017  . Parkinson disease ([place] 12/03/2016  . BPH (benign prostatic hyperplasia) 06/16/2013  . HTN (hypertension) 06/16/2013    . Obesity (BMI 30-39.9) 06/16/2013  . Sarcoma of lower extremity, left ([place] 06/14/2013  . Lipoma 06/02/2012  . 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

## 2020-10-02 ENCOUNTER — Ambulatory Visit (INDEPENDENT_AMBULATORY_CARE_PROVIDER_SITE_OTHER): Payer: PPO | Admitting: Rehabilitative and Restorative Service Providers"

## 2020-10-02 ENCOUNTER — Encounter: Payer: Self-pay | Admitting: Rehabilitative and Restorative Service Providers"

## 2020-10-02 ENCOUNTER — Other Ambulatory Visit: Payer: Self-pay

## 2020-10-02 DIAGNOSIS — R293 Abnormal posture: Secondary | ICD-10-CM | POA: Diagnosis not present

## 2020-10-02 DIAGNOSIS — M5441 Lumbago with sciatica, right side: Secondary | ICD-10-CM

## 2020-10-02 DIAGNOSIS — G8929 Other chronic pain: Secondary | ICD-10-CM | POA: Diagnosis not present

## 2020-10-02 DIAGNOSIS — R262 Difficulty in walking, not elsewhere classified: Secondary | ICD-10-CM

## 2020-10-02 DIAGNOSIS — M79604 Pain in right leg: Secondary | ICD-10-CM | POA: Diagnosis not present

## 2020-10-02 NOTE — Therapy (Addendum)
[facility] [address] [place], [place], [address] Phone: [phone]   Fax:  [phone]  Physical Therapy Treatment/Discharge  Patient Details  Name: Jacob Bolton MRN: [id number] Date of Birth: 1941-07-11 Referring Provider (PT): Dr. Ernestina Patches   Encounter Date: 10/02/2020   PT End of Session - 10/02/20 0927    Visit Number 5    Number of Visits 12    Date for PT Re-Evaluation 11/06/20    Progress Note Due on Visit 10    PT Start Time 0926    PT Stop Time 1006    PT Time Calculation (min) 40 min    Activity Tolerance Patient tolerated treatment well    Behavior During Therapy [facility] for tasks assessed/performed           Past Medical History:  Diagnosis Date  . High blood pressure   . Memory disorder 09/30/2019  . Parkinson disease ([place]) 12/03/2016    Past Surgical History:  Procedure Laterality Date  . CERVICAL DISC SURGERY  2007   Ruptured disc, plates and screws  . COLONOSCOPY  2008  . 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 Leonides Schanz, [facility]    There were no vitals filed for this visit.   Subjective Assessment - 10/02/20 0926    Subjective Pt. stated no specific pain upon arrival today    Pertinent History Rt femur liposarcoma c resection, HTN    Patient Stated Goals Reduce pain, playing golf, driving    Currently in Pain? No/denies    Pain Score 0-No pain    Pain Onset More than a month ago    Pain Score 0    Pain Onset More than a month ago              [facility] PT Assessment - 10/02/20 0001      Transfers   Comments 23 inch table sit to stand s UE assist                         [facility] Adult PT Treatment/Exercise - 10/02/20 0001      Lumbar Exercises: Stretches   Lower Trunk Rotation 5 reps   15 sec hold bilateral     Lumbar Exercises: Aerobic   Nustep lvl 6 10 mins      Lumbar Exercises: Seated   Sit to Stand 15 reps   23 inch table     Lumbar Exercises: Supine    Bridge Compliant   3 x 10   Straight Leg Raise 20 reps   bilateral   Other Supine Lumbar Exercises supine pball press down 10 sec x 10                    PT Short Term Goals - 09/28/20 0910      PT SHORT TERM GOAL #1   Title Patient will demonstrate independent use of home exercise program to maintain progress from in clinic treatments.    Time 2    Period Weeks    Status Revised    Target Date 10/12/20             PT Long Term Goals - 09/26/20 1633      PT LONG TERM GOAL #1   Title Patient will demonstrate/report pain at worst less than or equal to 2/10 to facilitate minimal limitation in daily activity secondary to pain symptoms.    Time 8  Period Weeks    Status On-going      PT LONG TERM GOAL #2   Title Patient will demonstrate independent use of home exercise program to facilitate ability to maintain/progress functional gains from skilled physical therapy services.    Time 8    Period Weeks    Status On-going      PT LONG TERM GOAL #3   Title Pt. will demonstrate lumbar ext AROM > 75% WFL to facilitate upright standing, walking posture at PLOF s limitation.    Time 8    Period Weeks    Status On-going      PT LONG TERM GOAL #4   Title Pt. will demonstrate upright standing posture s lateral shift, forward shift to facilitate upright standing/walking posture for daily activity.    Time 8    Period Weeks    Status Partially Met      PT LONG TERM GOAL #5   Title Pt. will demonstrate ability to return to golf activity.    Time 8    Period Weeks    Status On-going                 Plan - 10/02/20 0936    Clinical Impression Statement Overall reduction in symptoms reported in last few visits compared to evaluation.  Continued cues required at times in clinic for technique improvements/procedures on HEP.    Personal Factors and Comorbidities Comorbidity 2    Comorbidities Rt femur liposarcoma c resection, HTN    Examination-Activity  Limitations Sit;Squat;Stairs;Stand;Transfers;Lift    Examination-Participation Restrictions Community Activity;Yard Work;Other   lifting/carrying at home, golf   Stability/Clinical Decision Making Stable/Uncomplicated    Rehab Potential Good    PT Frequency 2x / week   1-2x/week   PT Duration 8 weeks    PT Treatment/Interventions ADLs/Self Care Home Management;Electrical Stimulation;Cryotherapy;Therapeutic activities;Iontophoresis 67m/ml Dexamethasone;Balance training;Traction;Therapeutic exercise;Moist Heat;Functional mobility training;Stair training;Gait training;Ultrasound;Neuromuscular re-education;Patient/family education;Manual techniques;Dry needling;Passive range of motion;Spinal Manipulations;Joint Manipulations    PT Next Visit Plan Lumbar/hip mobility, strengthening/endurance    PT Home Exercise Plan [email or web address] Additions:Access Code: [email or web address]   Consulted and Agree with Plan of Care Patient           Patient will benefit from skilled therapeutic intervention in order to improve the following deficits and impairments:  Abnormal gait, Decreased endurance, Hypomobility, Decreased activity tolerance, Decreased strength, Pain, Decreased mobility, Difficulty walking, Improper body mechanics, Postural dysfunction, Impaired flexibility, Decreased coordination, Decreased range of motion, Impaired perceived functional ability  Visit Diagnosis: Chronic bilateral low back pain with right-sided sciatica  Difficulty in walking, not elsewhere classified  Abnormal posture  Pain in right leg     Problem List Patient Active Problem List   Diagnosis Date Noted  . Memory disorder 09/30/2019  . Deep venous thrombosis ([place] 12/16/2018  . Lipoma of right lower extremity 05/20/2017  . Parkinson disease ([place] 12/03/2016  . BPH (benign prostatic hyperplasia) 06/16/2013  . HTN (hypertension) 06/16/2013  . Obesity (BMI 30-39.9) 06/16/2013  . Sarcoma of lower extremity, left ([place] 06/14/2013  .  Lipoma 06/02/2012  . 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  Patient goals were partially  met. Patient is being discharged due to not returning since the last visit.  ?????     Scot Jun, PT, DPT, OCS, ATC 12/06/20  2:01 PM       [facility] [address] [place], [place], [address] Phone: [phone]   Fax:  [phone]  Name: JAECION DEMPSTER MRN: [id number] Date of Birth: 08/03/1941

## 2020-10-04 ENCOUNTER — Encounter: Payer: PPO | Admitting: Rehabilitative and Restorative Service Providers"

## 2020-10-09 ENCOUNTER — Encounter: Payer: PPO | Admitting: Rehabilitative and Restorative Service Providers"

## 2020-10-11 ENCOUNTER — Encounter: Payer: PPO | Admitting: Rehabilitative and Restorative Service Providers"

## 2020-10-16 ENCOUNTER — Encounter: Payer: PPO | Admitting: Rehabilitative and Restorative Service Providers"

## 2020-10-18 ENCOUNTER — Encounter: Payer: PPO | Admitting: Rehabilitative and Restorative Service Providers"

## 2020-10-21 ENCOUNTER — Other Ambulatory Visit: Payer: Self-pay

## 2020-10-21 ENCOUNTER — Ambulatory Visit
Admission: RE | Admit: 2020-10-21 | Discharge: 2020-10-21 | Disposition: A | Discharge status: Home / Self Care | Payer: PPO | Source: Ambulatory Visit | Attending: Physical Medicine and Rehabilitation | Admitting: Physical Medicine and Rehabilitation

## 2020-10-21 DIAGNOSIS — M48061 Spinal stenosis, lumbar region without neurogenic claudication: Secondary | ICD-10-CM | POA: Diagnosis not present

## 2020-10-21 DIAGNOSIS — G8929 Other chronic pain: Secondary | ICD-10-CM

## 2020-10-21 DIAGNOSIS — M545 Low back pain, unspecified: Secondary | ICD-10-CM | POA: Diagnosis not present

## 2020-10-21 DIAGNOSIS — M25551 Pain in right hip: Secondary | ICD-10-CM

## 2020-10-21 DIAGNOSIS — M4306 Spondylolysis, lumbar region: Secondary | ICD-10-CM

## 2020-10-24 ENCOUNTER — Other Ambulatory Visit: Payer: Self-pay | Admitting: Neurology

## 2020-10-24 ENCOUNTER — Other Ambulatory Visit: Payer: Self-pay | Admitting: Physical Medicine and Rehabilitation

## 2020-10-24 NOTE — Telephone Encounter (Signed)
Please advise 

## 2020-10-31 DIAGNOSIS — Z1159 Encounter for screening for other viral diseases: Secondary | ICD-10-CM | POA: Diagnosis not present

## 2020-11-01 ENCOUNTER — Encounter: Payer: Self-pay | Admitting: Physical Medicine and Rehabilitation

## 2020-11-01 ENCOUNTER — Ambulatory Visit (INDEPENDENT_AMBULATORY_CARE_PROVIDER_SITE_OTHER): Payer: PPO | Admitting: Physical Medicine and Rehabilitation

## 2020-11-01 ENCOUNTER — Other Ambulatory Visit: Payer: Self-pay

## 2020-11-01 VITALS — BP 129/83 | HR 68

## 2020-11-01 DIAGNOSIS — C4921 Malignant neoplasm of connective and soft tissue of right lower limb, including hip: Secondary | ICD-10-CM | POA: Diagnosis not present

## 2020-11-01 DIAGNOSIS — M48062 Spinal stenosis, lumbar region with neurogenic claudication: Secondary | ICD-10-CM

## 2020-11-01 DIAGNOSIS — M47816 Spondylosis without myelopathy or radiculopathy, lumbar region: Secondary | ICD-10-CM

## 2020-11-01 DIAGNOSIS — M79651 Pain in right thigh: Secondary | ICD-10-CM | POA: Diagnosis not present

## 2020-11-01 NOTE — Progress Notes (Signed)
Feels fine now, but back begins to bother him when lifting and moving boxes. Numeric Pain Rating Scale and Functional Assessment Average Pain 9   In the last MONTH (on 0-10 scale) has pain interfered with the following?  1. General activity like being  able to carry out your everyday physical activities such as walking, climbing stairs, carrying groceries, or moving a chair?  Rating(1)

## 2020-11-02 ENCOUNTER — Encounter: Payer: Self-pay | Admitting: Physical Medicine and Rehabilitation

## 2020-11-02 DIAGNOSIS — C499 Malignant neoplasm of connective and soft tissue, unspecified: Secondary | ICD-10-CM | POA: Insufficient documentation

## 2020-11-02 DIAGNOSIS — C4921 Malignant neoplasm of connective and soft tissue of right lower limb, including hip: Secondary | ICD-10-CM | POA: Insufficient documentation

## 2020-11-02 NOTE — Progress Notes (Signed)
Jacob Bolton - 79 y.o. male MRN [id number]  Date of birth: November 29, 1941  Office Visit Note: Visit Date: 11/01/2020 PCP: Hulan Fess, MD Referred by: Hulan Fess, MD  Subjective: Chief Complaint  Patient presents with  . Lower Back - Pain   HPI: Jacob Bolton is a 79 y.o. male who comes in today For evaluation and management chronic worsening back pain with right thigh pain.  History can be reviewed on our prior notes.  Basically patient has had chronic worsening back pain worse with lifting and standing better at rest.  Rates his average pain is a 9 out of 10.  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  This is managed by Dr. Magda Bernheim at [facility].  There was some thought that may be some of the pain into the thigh was related to his spine.  He went through some physical therapy which did not seem to help as much it did give him some strengthening ideas to do at home part of the therapy was not tolerated very well.  Patient would like to be more active and be able to swing a golf club.  He is present with his wife today provides some of the history.  We did obtain MRI of the lumbar spine.  This is reviewed with him and his wife today using spine models and imaging.  The report is reviewed below.  He has not noted any new focal weakness he has not noted new paresthesias.  He does get pain into the right thigh and he is just unsure if this is all related to the spine at all or just related to the prior liposarcoma.  He has had no focal weakness in the feet no bowel or bladder changes no trauma.  Greater than 50% of this visit (total duration of visit 30 min) was spent in counseling and coordination of care discussing  Review of Systems  Musculoskeletal: Positive for back pain and joint pain.  Neurological: Positive for tingling and tremors.  All other systems reviewed and are negative.  Otherwise per  HPI.  Assessment & Plan: Visit Diagnoses:  1. Liposarcoma of right thigh ([place])   2. Pain in right thigh   3. Spinal stenosis of lumbar region with neurogenic claudication   4. Spondylosis without myelopathy or radiculopathy, lumbar region     Plan: Findings:  1.  Chronic worsening back pain worse with standing and ambulating and lifting and moving boxes.  Symptoms consistent with facet arthropathy which he really has at every level of the lumbar spine.  At least at a moderate level throughout.  He does have stenosis of the central canal lateral recess of particular at L3-4 which is a good case of moderate.  No severe nerve compression but clearly could give him symptoms of claudication or radicular type pain.  Diagnostically could recommend epidural injection at that level to see if it affects his low back and right thigh from a diagnostic or therapeutic standpoint.  Patient does not really want to do that he feels like the right thigh pain is really predominant with the liposarcoma.  He will continue with home exercises and current medication.  Recommend injection if he had more clear radicular complaints or claudication or his back pain worsens.  2.  Right thigh pain with liposarcoma managed by Dr. Magda Bernheim at [facility] patient continue obviously follow-up with them and  see if there is anything they can do from a pain standpoint with his right thigh.  They may wish to refer him to [facility] [place]: No orders of the defined types were placed in this encounter.  No orders of the defined types were placed in this encounter.   Follow-up: Return if symptoms worsen or fail to improve.   Procedures: No procedures performed  No notes on file   Clinical History: MRI LUMBAR SPINE WITHOUT CONTRAST  TECHNIQUE: Multiplanar, multisequence MR imaging of the lumbar spine was performed. No intravenous contrast was administered.  COMPARISON:   Lumbar radiographs 08/30/2020  FINDINGS: Segmentation: 5 non rib-bearing lumbar vertebral bodies. The inferior-most fully formed intervertebral disc is labeled L5-S1.  Alignment: Dextrocurvature. Trace stepwise retrolisthesis of L1 on L2, L2 on L3, L3 on L4. Minimal (grade 1) anterolisthesis of L4 on L5.  Vertebrae: No evidence of acute, osteomyelitis, or suspicious bone lesion. Scattered benign vertebral venous malformations.  Conus medullaris and cauda equina: Conus extends to the L1-L2 level. Conus appears normal.  Paraspinal and other soft tissues: Extrarenal pelvis bilaterally. Possible bilateral parapelvic cyst, suboptimally evaluated on this study.  Disc levels:  T12-L1: No significant disc protrusion, foraminal stenosis, or canal stenosis.  L1-L2: Trace retrolisthesis of L1 on L2. Small broad-based disc bulge and mild bilateral facet hypertrophy without significant canal or foraminal stenosis.  L2-L3: Trace retrolisthesis of L2 on L3. Broad-based disc bulge with moderate bilateral facet hypertrophy. Mild bilateral foraminal stenosis. Mild canal and bilateral subarticular recess stenosis.  L3-L4: Trace retrolisthesis of L3 on L4. Left eccentric broad-based disc bulge and moderate to severe bilateral facet hypertrophy. Mild to moderate canal. Moderate and mild right subarticular recess stenosis. Moderate left and mild right foraminal stenosis.  L4-L5: Mild (grade 1) anterolisthesis of L4 on L5 with uncovering of the disc. Superimposed left eccentric broad-based disc bulge with severe left and moderate right facet hypertrophy. Mild canal stenosis and bilateral subarticular recess stenosis. Mild bilateral foraminal stenosis.  L5-S1: Degenerative disc disease with disc height loss and desiccation. Mild posterior osteophytic ridging. Moderate bilateral facet hypertrophy. No significant canal or foraminal stenosis.  IMPRESSION: 1. At L3-L4 there is mild  to moderate canal stenosis and moderate left and mild right subarticular recess and foraminal stenosis. 2. At L4-L5 there is mild canal stenosis, mild left greater than right subarticular recess narrowing and foraminal stenosis.   Electronically Signed   By: Margaretha Sheffield MD   On: 10/21/2020 14:46 -------  MRI RIGHT THIGH/FEMUR WITHOUT CONTRAST, 06/27/2020 12:19 PM   CONCLUSION:   1. Status post surgical changes of medial compartment debulking of a recurrent right thigh liposarcoma as above.  2. The remaining fat-containing lesions in the posterior compartment of the right thigh have increase in size compared to 12/28/2019.  3. Nodular masslike area in the abductor compartment with low heterogeneous signal is similar to prior and likely represents a fibrous area or postsurgical scar. If there is concern fordedifferentiated neoplasm, recommend MRI with contrast to better evaluate this area.  Specimen Collected: 06/27/20 1:25   He reports that he has quit smoking. He has never used smokeless tobacco. No results for input(s): HGBA1C, LABURIC in the last 8760 hours.  Objective:  VS:  HT:    WT:   BMI:     BP:129/83  HR:68bpm  TEMP: ( )  RESP:  Physical Exam Constitutional:      General: He is not in acute distress.  Appearance: Normal appearance. He is not ill-appearing.  HENT:     Head: Normocephalic and atraumatic.     Right Ear: External ear normal.     Left Ear: External ear normal.  Eyes:     Extraocular Movements: Extraocular movements intact.  Cardiovascular:     Rate and Rhythm: Normal rate.     Pulses: Normal pulses.  Abdominal:     General: There is no distension.     Palpations: Abdomen is soft.  Musculoskeletal:        General: No tenderness or signs of injury.     Right lower leg: No edema.     Left lower leg: No edema.     Comments: Patient has good distal strength without clonus.  He stands with forward flexed lumbar spine.  He has pain with  extension of the lumbar spine and facet loading consistent with his back pain.  He has no trigger points noted.  No pain with hip rotation.    Skin:    Findings: No erythema or rash.  Neurological:     General: No focal deficit present.     Mental Status: He is alert and oriented to person, place, and time.     Sensory: No sensory deficit.     Motor: No weakness or abnormal muscle tone.     Coordination: Coordination normal.  Psychiatric:        Mood and Affect: Mood normal.        Behavior: Behavior normal.     Ortho Exam  Imaging: No results found.  Past Medical/Family/Surgical/Social History: Medications & Allergies reviewed per EMR, new medications updated. Patient Active Problem List   Diagnosis Date Noted  . Liposarcoma of right thigh ([place]) 11/02/2020  . Memory disorder 09/30/2019  . Deep venous thrombosis ([place]) 12/16/2018  . Lipoma of right lower extremity 05/20/2017  . Parkinson disease ([place]) 12/03/2016  . BPH (benign prostatic hyperplasia) 06/16/2013  . HTN (hypertension) 06/16/2013  . Obesity (BMI 30-39.9) 06/16/2013  . Sarcoma of lower extremity, left ([place]) 06/14/2013  . Lipoma 06/02/2012  . 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 High blood pressure   . Memory disorder 09/30/2019  . Parkinson disease ([place]) 12/03/2016   Family History  Problem Relation Age of Onset  . High blood pressure Mother   . Anxiety disorder Mother   . High blood pressure Father   . Heart attack Father    Past Surgical History:  Procedure Laterality Date  . CERVICAL DISC SURGERY  2007   Ruptured disc, plates and screws  . COLONOSCOPY  2008  . 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 Leonides Schanz, [place] History  . Occupation: Retired  Tobacco Use  . Smoking status: Former Research scientist (life sciences)  . Smokeless tobacco: Never Used  Substance and Sexual  Activity  . Alcohol use: Yes    Alcohol/week: 3.0 standard drinks    Types: 3 Glasses of wine per week    Comment: WIne 2-3 nights per week  . Drug use: No  . Sexual activity: Not on file

## 2020-11-03 DIAGNOSIS — D120 Benign neoplasm of cecum: Secondary | ICD-10-CM | POA: Diagnosis not present

## 2020-11-03 DIAGNOSIS — D122 Benign neoplasm of ascending colon: Secondary | ICD-10-CM | POA: Diagnosis not present

## 2020-11-03 DIAGNOSIS — D123 Benign neoplasm of transverse colon: Secondary | ICD-10-CM | POA: Diagnosis not present

## 2020-11-03 DIAGNOSIS — Z86010 Personal history of colonic polyps: Secondary | ICD-10-CM | POA: Diagnosis not present

## 2020-11-03 DIAGNOSIS — K648 Other hemorrhoids: Secondary | ICD-10-CM | POA: Diagnosis not present

## 2020-11-03 DIAGNOSIS — K5730 Diverticulosis of large intestine without perforation or abscess without bleeding: Secondary | ICD-10-CM | POA: Diagnosis not present

## 2020-11-07 DIAGNOSIS — D120 Benign neoplasm of cecum: Secondary | ICD-10-CM | POA: Diagnosis not present

## 2020-11-07 DIAGNOSIS — D122 Benign neoplasm of ascending colon: Secondary | ICD-10-CM | POA: Diagnosis not present

## 2020-11-07 DIAGNOSIS — D123 Benign neoplasm of transverse colon: Secondary | ICD-10-CM | POA: Diagnosis not present

## 2020-11-16 ENCOUNTER — Other Ambulatory Visit: Payer: Self-pay | Admitting: Physical Medicine and Rehabilitation

## 2020-11-16 ENCOUNTER — Telehealth: Payer: Self-pay | Admitting: Physical Medicine and Rehabilitation

## 2020-11-16 NOTE — Telephone Encounter (Signed)
-----   Message from Magnus Sinning, MD sent at 11/16/2020  8:12 AM EST ----- Regarding: Meloxicam I am refilling again but hee needs to have this looked at by PCP if continued

## 2020-11-16 NOTE — Telephone Encounter (Signed)
Called patient to advise  °

## 2020-11-16 NOTE — Telephone Encounter (Signed)
Please advise 

## 2020-11-29 DIAGNOSIS — I10 Essential (primary) hypertension: Secondary | ICD-10-CM | POA: Diagnosis not present

## 2020-11-29 DIAGNOSIS — N400 Enlarged prostate without lower urinary tract symptoms: Secondary | ICD-10-CM | POA: Diagnosis not present

## 2020-11-29 DIAGNOSIS — G20 Parkinson's disease: Secondary | ICD-10-CM | POA: Diagnosis not present

## 2020-12-04 ENCOUNTER — Encounter: Payer: Self-pay | Admitting: Neurology

## 2020-12-04 ENCOUNTER — Ambulatory Visit: Payer: PPO | Admitting: Neurology

## 2020-12-04 VITALS — BP 141/80 | HR 73 | Ht 71.0 in | Wt 229.0 lb

## 2020-12-04 DIAGNOSIS — R413 Other amnesia: Secondary | ICD-10-CM

## 2020-12-04 DIAGNOSIS — G20 Parkinson's disease: Secondary | ICD-10-CM | POA: Diagnosis not present

## 2020-12-04 MED ORDER — DONEPEZIL HCL 5 MG PO TABS: 5.0000 mg | ORAL_TABLET | Freq: Every day | ORAL | 1 refills | Status: DC

## 2020-12-04 MED ORDER — CARBIDOPA-LEVODOPA 25-250 MG PO TABS: 1.0000 | ORAL_TABLET | Freq: Three times a day (TID) | ORAL | 3 refills | Status: DC

## 2020-12-04 NOTE — Patient Instructions (Signed)
We will increase the Sinemet to 25/250 mg, taking one tablet three times a day.  Start aricept 5 mg at night.  Sinemet (carbidopa) may result in confusion or hallucinations, drowsiness, nausea, or dizziness. If any significant side effects are noted, please contact our office. Sinemet may not be well absorbed when taken with high protein meals, if tolerated it is best to take 30-45 minutes before you eat.   Begin Aricept (donepezil) at 5 mg at night for one month. If this medication is well-tolerated, please call our office and we will call in a prescription for the 10 mg tablets. Look out for side effects that may include nausea, diarrhea, weight loss, or stomach cramps. This medication will also cause a runny nose, therefore there is no need for allergy medications for this purpose.

## 2020-12-04 NOTE — Progress Notes (Signed)
Reason for visit: Parkinson's disease, memory disorder  Jacob Bolton is an 79 y.o. male  History of present illness:  Jacob Bolton is a 79 year old right-handed white male with a history of Parkinson's disease.  He has developed some troubles with memory, his wife is not doing the finances.  He is still able to operate a motor vehicle.  He will have occasional hallucinations, he may see a figure of someone in the house or a sensation that a cat is walking over him in bed at night.  The events are not threatening to him.  He is still having some worsening of short-term memory according to his wife.  The patient is slowing down a bit with the Parkinson's disease, he will stumble on occasion, he is no longer playing golf.  The patient was having some low back pain and it was felt that the cough was worsening this, he is feeling better with his back pain but still has some residual right leg pain following a resection of a sarcoma in the past.  The patient denies any problems with swallowing or choking.  He currently is on Mirapex taking 1 mg 3 times daily and he takes Sinemet CR 25/100 mg tablets, 2 tablets 3 times daily.  Past Medical History:  Diagnosis Date  . High blood pressure   . Memory disorder 09/30/2019  . Parkinson disease ([place]) 12/03/2016    Past Surgical History:  Procedure Laterality Date  . CERVICAL DISC SURGERY  2007   Ruptured disc, plates and screws  . COLONOSCOPY  2008  . 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 Leonides Schanz, [facility]    Family History  Problem Relation Age of Onset  . High blood pressure Mother   . Anxiety disorder Mother   . High blood pressure Father   . Heart attack Father     Social history:  reports that he has quit smoking. He has never used smokeless tobacco. He reports current alcohol use of about 3.0 standard drinks of alcohol per week. He reports that he does not use drugs.    Allergies  Allergen Reactions  .  Sulfamethoxazole     Other reaction(s): Other (See Comments) Childhood allergy    Medications:  Prior to Admission medications   Medication Sig Start Date End Date Taking? Authorizing Provider  Azelastine HCl 0.15 % SOLN  11/11/16   [provider]  Carbidopa-Levodopa ER (SINEMET CR) 25-100 MG tablet controlled release TAKE 2 TABLETS BY MOUTH 3 TIMES A DAY 09/11/20   Kathrynn Ducking, MD  Cholecalciferol (VITAMIN D-1000 MAX ST) 1000 units tablet Take by mouth.    [provider]  dutasteride (AVODART) 0.5 MG capsule  10/23/16   [provider]  fluticasone Asencion Islam) 50 MCG/ACT nasal spray  10/28/16   [provider]  HYDROcodone-acetaminophen (NORCO/VICODIN) 5-325 MG tablet Take 1 tablet by mouth every 6 (six) hours as needed. 07/06/20   [provider]  losartan-hydrochlorothiazide (HYZAAR) 100-12.5 MG tablet Take by mouth.    [provider]  meloxicam (MOBIC) 15 MG tablet TAKE 1 TABLET BY MOUTH EVERY DAY WITH FOOD 11/16/20   Magnus Sinning, MD  pramipexole (MIRAPEX) 1 MG tablet TAKE 1 TABLET BY MOUTH THREE TIMES A DAY 10/24/20   Kathrynn Ducking, MD  propranolol (INDERAL) 10 MG tablet Take 1 tablet (10 mg total) by mouth 3 (three) times daily as needed. 03/18/18   Kathrynn Ducking, MD  terazosin (HYTRIN) 10 MG capsule Take 10 mg by mouth. 05/20/12   [provider]    ROS:  Out of a complete 14 system review of symptoms, the patient complains only of the following symptoms, and all other reviewed systems are negative.  Walking difficulty Memory problems Right leg pain  Height 5\' 11"  (1.803 m), weight 229 lb (103.9 kg).  Physical Exam  General: The patient is alert and cooperative at the time of the examination.  Skin: No significant peripheral edema is noted.   Neurologic Exam  Mental status: The patient is alert and oriented x 3 at the time of the examination. The Mini-Mental status examination done today shows  a total score 28/30.  The patient is able to name 8 animals in 60 seconds.   Cranial nerves: Facial symmetry is present. Speech is normal, no aphasia or dysarthria is noted. Extraocular movements are full. Visual fields are full.  Masking of the face is seen.  Motor: The patient has good strength in all 4 extremities.  Sensory examination: Soft touch sensation is symmetric on the face, arms, and legs.  Coordination: The patient has good finger-nose-finger and heel-to-shin bilaterally.  Gait and station: The patient is unable to arise from a seated position with arms crossed.  He has to rock in order to stand up, once up, he has a somewhat stooped posture, slowness of movement, shuffling the feet.  He does have symmetric but depressed arm swing, occasional tremor seen with the left arm.  Tandem gait is slightly unsteady.  Romberg is negative.  Reflexes: Deep tendon reflexes are symmetric.   MRI lumbar 10/21/20:  IMPRESSION: 1. At L3-L4 there is mild to moderate canal stenosis and moderate left and mild right subarticular recess and foraminal stenosis. 2. At L4-L5 there is mild canal stenosis, mild left greater than right subarticular recess narrowing and foraminal stenosis.   Assessment/Plan:  1.  Parkinson's disease  2.  Gait disorder  3.  Memory disorder  The patient will be started on 5 mg of Aricept at night, he will call for any dose increase.  The patient will be increased on the Sinemet dosing taking 25/250 mg tablets, 1 tablet 3 times daily.  I have encouraged him to remain active particularly as he is not playing golf anymore.  He will follow up here in 5 months.  Jacob Alexanders MD 12/04/2020 8:08 AM  [facility] [address] [place], [place] [address]  Phone [phone] Fax [phone]

## 2020-12-11 ENCOUNTER — Other Ambulatory Visit: Payer: Self-pay | Admitting: Physical Medicine and Rehabilitation

## 2020-12-11 NOTE — Telephone Encounter (Signed)
Please advise 

## 2020-12-19 DIAGNOSIS — C4921 Malignant neoplasm of connective and soft tissue of right lower limb, including hip: Secondary | ICD-10-CM | POA: Diagnosis not present

## 2020-12-19 DIAGNOSIS — D1723 Benign lipomatous neoplasm of skin and subcutaneous tissue of right leg: Secondary | ICD-10-CM | POA: Diagnosis not present

## 2020-12-27 ENCOUNTER — Other Ambulatory Visit: Payer: Self-pay | Admitting: Neurology

## 2021-01-05 ENCOUNTER — Other Ambulatory Visit: Payer: Self-pay | Admitting: Physical Medicine and Rehabilitation

## 2021-01-05 NOTE — Telephone Encounter (Signed)
Please advise 

## 2021-01-12 ENCOUNTER — Other Ambulatory Visit: Payer: Self-pay | Admitting: Physical Medicine and Rehabilitation

## 2021-01-16 NOTE — Telephone Encounter (Signed)
Last refilled on 1/7? This should last 30 days. Can you call and ask how he is taking and we may need to transition to PCP to refill and manage.

## 2021-01-16 NOTE — Telephone Encounter (Signed)
Please advise 

## 2021-01-19 NOTE — Telephone Encounter (Signed)
Left message #1

## 2021-01-27 ENCOUNTER — Other Ambulatory Visit: Payer: Self-pay | Admitting: Neurology

## 2021-01-29 ENCOUNTER — Telehealth: Payer: Self-pay | Admitting: Neurology

## 2021-01-29 NOTE — Telephone Encounter (Signed)
I called and talk with the patient.  The patient was on the Sinemet CR 25/100, he was taking 2 tablets 3 times a day.  He is to stop the Sinemet CR and start the Sinemet 25/250 mg tablets taking 1 3 times daily.  The patient appears to understand this.

## 2021-01-29 NOTE — Telephone Encounter (Signed)
[facility] LVM, Pt confused about his carbidopa-levodopa (SINEMET) 25-250 MG tablet prescriptions. Apparently he has extended release and immediate release. Pt was asking [place] questions if he suppose to be on both. Calling to verify, would like a call from the nurse.  Contact info: [phone]

## 2021-02-05 DIAGNOSIS — D1723 Benign lipomatous neoplasm of skin and subcutaneous tissue of right leg: Secondary | ICD-10-CM | POA: Diagnosis not present

## 2021-02-05 DIAGNOSIS — C4921 Malignant neoplasm of connective and soft tissue of right lower limb, including hip: Secondary | ICD-10-CM | POA: Diagnosis not present

## 2021-02-06 DIAGNOSIS — C4921 Malignant neoplasm of connective and soft tissue of right lower limb, including hip: Secondary | ICD-10-CM | POA: Diagnosis not present

## 2021-02-06 DIAGNOSIS — R339 Retention of urine, unspecified: Secondary | ICD-10-CM | POA: Diagnosis not present

## 2021-02-06 DIAGNOSIS — N400 Enlarged prostate without lower urinary tract symptoms: Secondary | ICD-10-CM | POA: Diagnosis not present

## 2021-02-06 DIAGNOSIS — G20 Parkinson's disease: Secondary | ICD-10-CM | POA: Diagnosis not present

## 2021-02-07 DIAGNOSIS — N401 Enlarged prostate with lower urinary tract symptoms: Secondary | ICD-10-CM | POA: Diagnosis not present

## 2021-02-07 DIAGNOSIS — R338 Other retention of urine: Secondary | ICD-10-CM | POA: Diagnosis not present

## 2021-02-14 DIAGNOSIS — R338 Other retention of urine: Secondary | ICD-10-CM | POA: Diagnosis not present

## 2021-02-20 DIAGNOSIS — Z4802 Encounter for removal of sutures: Secondary | ICD-10-CM | POA: Diagnosis not present

## 2021-02-21 DIAGNOSIS — R338 Other retention of urine: Secondary | ICD-10-CM | POA: Diagnosis not present

## 2021-02-22 ENCOUNTER — Other Ambulatory Visit: Payer: Self-pay | Admitting: Neurology

## 2021-02-22 ENCOUNTER — Telehealth: Payer: Self-pay | Admitting: *Deleted

## 2021-02-22 NOTE — Telephone Encounter (Signed)
Our office received a refill request for donepezil 5mg , one tablet at bedtime. I called his wife to confirm this dosage. She reported that he was having nightmares while taking it. The nightmares stopped after the medication was discontinued. The refill was denied to the pharmacy. She is aware we will provide this information to Dr. Jannifer Franklin. His chart has also been updated.

## 2021-02-23 MED ORDER — DONEPEZIL HCL 5 MG PO TABS: 5.0000 mg | ORAL_TABLET | Freq: Every day | ORAL | 1 refills | Status: DC

## 2021-02-23 NOTE — Addendum Note (Signed)
Addended by: Kathrynn Ducking on: 02/23/2021 09:29 AM   Modules accepted: Orders

## 2021-02-23 NOTE — Telephone Encounter (Signed)
I called the patient, the patient may try taking the donepezil in the morning rather than the evening, if the nightmares continue, he will call our office, I will send in a refill.

## 2021-03-01 DIAGNOSIS — N401 Enlarged prostate with lower urinary tract symptoms: Secondary | ICD-10-CM | POA: Diagnosis not present

## 2021-03-07 DIAGNOSIS — R972 Elevated prostate specific antigen [PSA]: Secondary | ICD-10-CM | POA: Diagnosis not present

## 2021-03-07 DIAGNOSIS — R338 Other retention of urine: Secondary | ICD-10-CM | POA: Diagnosis not present

## 2021-03-09 ENCOUNTER — Inpatient Hospital Stay (HOSPITAL_COMMUNITY)
Admission: EM | Admit: 2021-03-09 | Discharge: 2021-03-12 | DRG: 872 | Disposition: A | Discharge status: Home Health | Payer: PPO | Attending: Internal Medicine | Admitting: Internal Medicine

## 2021-03-09 ENCOUNTER — Inpatient Hospital Stay (HOSPITAL_COMMUNITY): Payer: PPO

## 2021-03-09 ENCOUNTER — Emergency Department (HOSPITAL_COMMUNITY): Payer: PPO

## 2021-03-09 DIAGNOSIS — Z818 Family history of other mental and behavioral disorders: Secondary | ICD-10-CM | POA: Diagnosis not present

## 2021-03-09 DIAGNOSIS — E871 Hypo-osmolality and hyponatremia: Secondary | ICD-10-CM | POA: Diagnosis not present

## 2021-03-09 DIAGNOSIS — I10 Essential (primary) hypertension: Secondary | ICD-10-CM | POA: Diagnosis present

## 2021-03-09 DIAGNOSIS — K5900 Constipation, unspecified: Secondary | ICD-10-CM | POA: Diagnosis not present

## 2021-03-09 DIAGNOSIS — R5381 Other malaise: Secondary | ICD-10-CM | POA: Diagnosis present

## 2021-03-09 DIAGNOSIS — F0280 Dementia in other diseases classified elsewhere without behavioral disturbance: Secondary | ICD-10-CM | POA: Diagnosis present

## 2021-03-09 DIAGNOSIS — Z882 Allergy status to sulfonamides status: Secondary | ICD-10-CM | POA: Diagnosis not present

## 2021-03-09 DIAGNOSIS — Z888 Allergy status to other drugs, medicaments and biological substances status: Secondary | ICD-10-CM | POA: Diagnosis not present

## 2021-03-09 DIAGNOSIS — Z8249 Family history of ischemic heart disease and other diseases of the circulatory system: Secondary | ICD-10-CM | POA: Diagnosis not present

## 2021-03-09 DIAGNOSIS — Z20822 Contact with and (suspected) exposure to covid-19: Secondary | ICD-10-CM | POA: Diagnosis present

## 2021-03-09 DIAGNOSIS — N390 Urinary tract infection, site not specified: Secondary | ICD-10-CM | POA: Diagnosis not present

## 2021-03-09 DIAGNOSIS — Z86718 Personal history of other venous thrombosis and embolism: Secondary | ICD-10-CM | POA: Diagnosis not present

## 2021-03-09 DIAGNOSIS — R7881 Bacteremia: Secondary | ICD-10-CM | POA: Diagnosis not present

## 2021-03-09 DIAGNOSIS — N179 Acute kidney failure, unspecified: Secondary | ICD-10-CM | POA: Diagnosis present

## 2021-03-09 DIAGNOSIS — R413 Other amnesia: Secondary | ICD-10-CM | POA: Diagnosis not present

## 2021-03-09 DIAGNOSIS — Z66 Do not resuscitate: Secondary | ICD-10-CM | POA: Diagnosis present

## 2021-03-09 DIAGNOSIS — A4159 Other Gram-negative sepsis: Secondary | ICD-10-CM | POA: Diagnosis not present

## 2021-03-09 DIAGNOSIS — A419 Sepsis, unspecified organism: Secondary | ICD-10-CM | POA: Diagnosis not present

## 2021-03-09 DIAGNOSIS — B961 Klebsiella pneumoniae [K. pneumoniae] as the cause of diseases classified elsewhere: Secondary | ICD-10-CM | POA: Diagnosis not present

## 2021-03-09 DIAGNOSIS — I809 Phlebitis and thrombophlebitis of unspecified site: Secondary | ICD-10-CM | POA: Diagnosis not present

## 2021-03-09 DIAGNOSIS — R6520 Severe sepsis without septic shock: Secondary | ICD-10-CM | POA: Diagnosis not present

## 2021-03-09 DIAGNOSIS — Z79899 Other long term (current) drug therapy: Secondary | ICD-10-CM

## 2021-03-09 DIAGNOSIS — Z87891 Personal history of nicotine dependence: Secondary | ICD-10-CM | POA: Diagnosis not present

## 2021-03-09 DIAGNOSIS — N401 Enlarged prostate with lower urinary tract symptoms: Secondary | ICD-10-CM | POA: Diagnosis not present

## 2021-03-09 DIAGNOSIS — R531 Weakness: Secondary | ICD-10-CM | POA: Diagnosis not present

## 2021-03-09 DIAGNOSIS — R338 Other retention of urine: Secondary | ICD-10-CM | POA: Diagnosis present

## 2021-03-09 DIAGNOSIS — G20 Parkinson's disease: Secondary | ICD-10-CM | POA: Diagnosis present

## 2021-03-09 DIAGNOSIS — N281 Cyst of kidney, acquired: Secondary | ICD-10-CM | POA: Diagnosis not present

## 2021-03-09 DIAGNOSIS — R339 Retention of urine, unspecified: Secondary | ICD-10-CM

## 2021-03-09 DIAGNOSIS — R000 Tachycardia, unspecified: Secondary | ICD-10-CM | POA: Diagnosis not present

## 2021-03-09 LAB — COMPREHENSIVE METABOLIC PANEL
ALT: <5 U/L (ref 0–44)
AST: 18 U/L (ref 15–41)
Albumin: 3.9 g/dL (ref 3.5–5.0)
Alkaline Phosphatase: 58 U/L (ref 38–126)
Anion gap: 11 (ref 5–15)
BUN: 22 mg/dL (ref 8–23)
CO2: 22 mmol/L (ref 22–32)
Calcium: 8.8 mg/dL — ABNORMAL LOW (ref 8.9–10.3)
Chloride: 94 mmol/L — ABNORMAL LOW (ref 98–111)
Creatinine, Ser: 1.27 mg/dL — ABNORMAL HIGH (ref 0.61–1.24)
GFR, Estimated: 57 mL/min — ABNORMAL LOW (ref >60)
Glucose, Bld: 114 mg/dL — ABNORMAL HIGH (ref 70–99)
Potassium: 3.6 mmol/L (ref 3.5–5.1)
Sodium: 127 mmol/L — ABNORMAL LOW (ref 135–145)
Total Bilirubin: 1.6 mg/dL — ABNORMAL HIGH (ref 0.3–1.2)
Total Protein: 7 g/dL (ref 6.5–8.1)

## 2021-03-09 LAB — CBC WITH DIFFERENTIAL/PLATELET
Abs Immature Granulocytes: 0.2 10*3/uL — ABNORMAL HIGH (ref 0.00–0.07)
Basophils Absolute: 0.1 10*3/uL (ref 0.0–0.1)
Basophils Relative: 0 %
Eosinophils Absolute: 0 10*3/uL (ref 0.0–0.5)
Eosinophils Relative: 0 %
HCT: 35 % — ABNORMAL LOW (ref 39.0–52.0)
Hemoglobin: 12 g/dL — ABNORMAL LOW (ref 13.0–17.0)
Immature Granulocytes: 1 %
Lymphocytes Relative: 1 %
Lymphs Abs: 0.2 10*3/uL — ABNORMAL LOW (ref 0.7–4.0)
MCH: 30.5 pg (ref 26.0–34.0)
MCHC: 34.3 g/dL (ref 30.0–36.0)
MCV: 89.1 fL (ref 80.0–100.0)
Monocytes Absolute: 1.3 10*3/uL — ABNORMAL HIGH (ref 0.1–1.0)
Monocytes Relative: 5 %
Neutro Abs: 25.4 10*3/uL — ABNORMAL HIGH (ref 1.7–7.7)
Neutrophils Relative %: 93 %
Platelets: 207 10*3/uL (ref 150–400)
RBC: 3.93 MIL/uL — ABNORMAL LOW (ref 4.22–5.81)
RDW: 12.7 % (ref 11.5–15.5)
WBC: 27.1 10*3/uL — ABNORMAL HIGH (ref 4.0–10.5)
nRBC: 0 % (ref 0.0–0.2)

## 2021-03-09 LAB — URINALYSIS, ROUTINE W REFLEX MICROSCOPIC
Bilirubin Urine: NEGATIVE
Glucose, UA: NEGATIVE mg/dL
Ketones, ur: 5 mg/dL — AB
Nitrite: NEGATIVE
Protein, ur: 30 mg/dL — AB
Specific Gravity, Urine: 1.009 (ref 1.005–1.030)
pH: 5 (ref 5.0–8.0)

## 2021-03-09 LAB — RESP PANEL BY RT-PCR (FLU A&B, COVID) ARPGX2
Influenza A by PCR: NEGATIVE
Influenza B by PCR: NEGATIVE
SARS Coronavirus 2 by RT PCR: NEGATIVE

## 2021-03-09 LAB — LACTIC ACID, PLASMA
Lactic Acid, Venous: 1 mmol/L (ref 0.5–1.9)
Lactic Acid, Venous: 1.1 mmol/L (ref 0.5–1.9)

## 2021-03-09 LAB — PROTIME-INR
INR: 1.2 (ref 0.8–1.2)
Prothrombin Time: 14.8 seconds (ref 11.4–15.2)

## 2021-03-09 LAB — APTT: aPTT: 33 seconds (ref 24–36)

## 2021-03-09 MED ORDER — ACETAMINOPHEN 325 MG PO TABS: 650.0000 mg | ORAL_TABLET | Freq: Four times a day (QID) | ORAL | Status: DC | PRN

## 2021-03-09 MED ORDER — DONEPEZIL HCL 10 MG PO TABS
5.0000 mg | ORAL_TABLET | Freq: Every day | ORAL | Status: DC
  Administered 2021-03-10 – 2021-03-12 (×3): 5 mg via ORAL
  Filled 2021-03-09 (×3): qty 1

## 2021-03-09 MED ORDER — TAMSULOSIN HCL 0.4 MG PO CAPS
0.4000 mg | ORAL_CAPSULE | Freq: Every day | ORAL | Status: DC
  Administered 2021-03-09 – 2021-03-12 (×4): 0.4 mg via ORAL
  Filled 2021-03-09 (×4): qty 1

## 2021-03-09 MED ORDER — LACTATED RINGERS IV BOLUS (SEPSIS)
500.0000 mL | Freq: Once | INTRAVENOUS | Status: AC
  Administered 2021-03-09: 500 mL via INTRAVENOUS

## 2021-03-09 MED ORDER — CARBIDOPA-LEVODOPA 25-250 MG PO TABS
1.0000 | ORAL_TABLET | Freq: Three times a day (TID) | ORAL | Status: DC
  Administered 2021-03-09 – 2021-03-12 (×9): 1 via ORAL
  Filled 2021-03-09 (×10): qty 1

## 2021-03-09 MED ORDER — LACTATED RINGERS IV SOLN
INTRAVENOUS | Status: DC
Start: 2021-03-09 — End: 2021-03-09

## 2021-03-09 MED ORDER — ACETAMINOPHEN 650 MG RE SUPP: 650.0000 mg | Freq: Four times a day (QID) | RECTAL | Status: DC | PRN

## 2021-03-09 MED ORDER — SODIUM CHLORIDE 0.9 % IV SOLN
1.0000 g | Freq: Once | INTRAVENOUS | Status: AC
  Administered 2021-03-09: 1 g via INTRAVENOUS
  Filled 2021-03-09: qty 10

## 2021-03-09 MED ORDER — ENOXAPARIN SODIUM 40 MG/0.4ML ~~LOC~~ SOLN
40.0000 mg | SUBCUTANEOUS | Status: DC
  Administered 2021-03-09 – 2021-03-11 (×3): 40 mg via SUBCUTANEOUS
  Filled 2021-03-09 (×3): qty 0.4

## 2021-03-09 MED ORDER — LACTATED RINGERS IV BOLUS
1000.0000 mL | Freq: Once | INTRAVENOUS | Status: AC
  Administered 2021-03-09: 1000 mL via INTRAVENOUS

## 2021-03-09 MED ORDER — VANCOMYCIN HCL IN DEXTROSE 1-5 GM/200ML-% IV SOLN
1000.0000 mg | Freq: Once | INTRAVENOUS | Status: DC
Start: 2021-03-09 — End: 2021-03-09
  Filled 2021-03-09: qty 200

## 2021-03-09 MED ORDER — SODIUM CHLORIDE 0.9 % IV SOLN
1.0000 g | INTRAVENOUS | Status: DC
  Administered 2021-03-10: 1 g via INTRAVENOUS
  Filled 2021-03-09: qty 1

## 2021-03-09 NOTE — Progress Notes (Signed)
A consult was received from an ED physician for vancomycin per pharmacy dosing (for an indication other than meningitis). The patient's profile has been reviewed for ht/wt/allergies/indication/available labs. A one time order has been placed for the above antibiotics.  Further antibiotics/pharmacy consults should be ordered by admitting physician if indicated.                       Reuel Boom, PharmD, BCPS [phone] 03/09/2021, 2:43 PM

## 2021-03-09 NOTE — ED Notes (Signed)
Bladder scan 702

## 2021-03-09 NOTE — Sepsis Progress Note (Signed)
eLink is monitoring this Code Sepsis. °

## 2021-03-09 NOTE — ED Notes (Signed)
No verbal report - chart reviewed

## 2021-03-09 NOTE — ED Triage Notes (Signed)
Ems states they were called out for weakness. Family couldn't get pt out of bed today. Pt had catheter removed yesterday.pt reports lower abdominal pain

## 2021-03-09 NOTE — Progress Notes (Signed)
Patient arrives to room 1524 from the ED via stretcher.  Patient assisted to bed with assist of 4.

## 2021-03-09 NOTE — H&P (Signed)
History and Physical        Hospital Admission Note Date: 03/09/2021  Patient name: Jacob Bolton Medical record number: [id number] Date of birth: 02-Mar-1941 Age: 80 y.o. Gender: male  PCP: Hulan Fess, MD    Chief Complaint    Chief Complaint  Patient presents with  . 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  Patient had the foley catheter removed either yesterday or Wednesday and has had decreased urinary output with some dribbling since that time. He had a follow up urology appointment today but was too weak at home to go to the appointment and so he was brought to the ED. Additionally with abdominal discomfort but no vomiting or diarrhea. No other complaints   ED Course: Febrile, tachycardic, tachypneic,  hemodynamically stable, on room air. Notable Labs: Sodium 127, K3.6, BUN 22, creatinine 1.27, AST 18, ALT undetectable, T bili 1.6, lactic acid 1.0, WBC 27, Hb 12.0, platelets 207, INR 1.2, UA positive for infection. Notable Imaging: CXR unremarkable.  Bladder scan for 700 mL and a Foley catheter was placed. Patient received ceftriaxone.    Vitals:   03/09/21 1445 03/09/21 1500  BP: 124/73 102/68  Pulse: (!) 114 (!) 103  Resp: 20 19  Temp:    SpO2: 96% 95%     Review of Systems:  Review of Systems  All other systems reviewed and are negative.   Medical/Social/Family History   Past Medical History: Past Medical History:  Diagnosis Date  . High blood pressure   . Memory disorder 09/30/2019  . Parkinson disease ([place]) 12/03/2016    Past  Surgical History:  Procedure Laterality Date  . CERVICAL DISC SURGERY  2007   Ruptured disc, plates and screws  . COLONOSCOPY  2008  . 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 Leonides Schanz, [facility]    Medications: Prior to Admission medications   Medication Sig Start Date End Date Taking? Authorizing Provider  Azelastine HCl 0.15 % SOLN  11/11/16   [provider]  carbidopa-levodopa (SINEMET) 25-250 MG tablet Take 1 tablet by mouth 3 (three) times daily. 12/04/20   Kathrynn Ducking, MD  Cholecalciferol (VITAMIN D-1000 MAX ST) 1000 units tablet Take by mouth.    [provider]  donepezil (ARICEPT) 5 MG tablet Take 1 tablet (5 mg total) by mouth daily after breakfast. 02/23/21   Kathrynn Ducking, MD  dutasteride (AVODART) 0.5 MG capsule  10/23/16   [provider]  fluticasone Asencion Islam) 50 MCG/ACT nasal spray  10/28/16   [provider]  HYDROcodone-acetaminophen (NORCO/VICODIN) 5-325 MG tablet Take 1 tablet by mouth every 6 (six) hours as needed. 07/06/20   [provider]  losartan-hydrochlorothiazide (HYZAAR) 100-12.5 MG tablet Take by mouth.    [provider]  meloxicam (MOBIC) 15 MG tablet TAKE 1 TABLET BY MOUTH EVERY DAY WITH FOOD  01/05/21   Magnus Sinning, MD  pramipexole (MIRAPEX) 1 MG tablet TAKE 1 TABLET BY MOUTH THREE TIMES A DAY 10/24/20   Kathrynn Ducking, MD  silodosin (RAPAFLO) 8 MG CAPS capsule Take 8 mg by mouth daily. 02/21/21   [provider]  terazosin (HYTRIN) 10 MG capsule Take 10 mg by mouth. 05/20/12   [provider]    Allergies:   Allergies  Allergen Reactions  . Donepezil Other (See Comments)    nightmares  . Sulfamethoxazole     Other reaction(s): Other (See Comments) Childhood allergy    Social History:  reports that he has quit smoking. He has never used smokeless tobacco. He reports current alcohol use of about 3.0 standard drinks of alcohol per week. He  reports that he does not use drugs.  Family History: Family History  Problem Relation Age of Onset  . High blood pressure Mother   . Anxiety disorder Mother   . High blood pressure Father   . Heart attack Father      Objective   Physical Exam: Blood pressure 102/68, pulse (!) 103, temperature (!) 100.9 F (38.3 C), temperature source Rectal, resp. rate 19, height 5\' 11"  (1.803 m), weight 93.4 kg, SpO2 95 %.  Physical Exam Vitals and nursing note reviewed.  Constitutional:      Appearance: Normal appearance.  HENT:     Head: Normocephalic and atraumatic.  Eyes:     Conjunctiva/sclera: Conjunctivae normal.  Cardiovascular:     Rate and Rhythm: Normal rate and regular rhythm.  Pulmonary:     Effort: Pulmonary effort is normal.     Breath sounds: Normal breath sounds.  Abdominal:     General: Abdomen is flat.     Palpations: Abdomen is soft.     Tenderness: There is abdominal tenderness in the suprapubic area.  Genitourinary:    Comments: Foley catheter with clear yellow urine Musculoskeletal:        General: No swelling or tenderness.     Comments: Well healing right posterior thigh surgical scar without evidence of infection  Skin:    Coloration: Skin is not jaundiced or pale.  Neurological:     Mental Status: He is alert. Mental status is at baseline.  Psychiatric:        Mood and Affect: Mood normal.        Behavior: Behavior normal.     LABS on Admission: I have personally reviewed all the labs and imaging below    Basic Metabolic Panel: Recent Labs  Lab 03/09/21 1327  NA 127*  K 3.6  CL 94*  CO2 22  GLUCOSE 114*  BUN 22  CREATININE 1.27*  CALCIUM 8.8*   Liver Function Tests: Recent Labs  Lab 03/09/21 1327  AST 18  ALT <5  ALKPHOS 58  BILITOT 1.6*  PROT 7.0  ALBUMIN 3.9   No results for input(s): LIPASE, AMYLASE in the last 168 hours. No results for input(s): AMMONIA in the last 168 hours. CBC: Recent Labs  Lab 03/09/21 1327  WBC  27.1*  NEUTROABS 25.4*  HGB 12.0*  HCT 35.0*  MCV 89.1  PLT 207   Cardiac Enzymes: No results for input(s): CKTOTAL, CKMB, CKMBINDEX, TROPONINI in the last 168 hours. BNP: Invalid input(s): POCBNP CBG: No results for input(s): GLUCAP in the last 168 hours.  Radiological Exams on Admission:  DG Chest Port 1 View  Result Date: 03/09/2021 CLINICAL DATA:  Weakness EXAM: PORTABLE CHEST 1 VIEW COMPARISON:  Chest radiograph  September 25, 2016 and chest CT February 23, 2019 FINDINGS: Apparent cardiac enlargement, likely accentuated by portable technique. No lobar consolidation. No significant pleural effusion or visible pneumothorax. Partially visualized anterior spinal fixation hardware. IMPRESSION: No active disease. Electronically Signed   By: Dahlia Bailiff MD   On: 03/09/2021 13:41      EKG: sinus tachycardia   A & P   Principal Problem:   Sepsis ([place]) Active Problems:   Parkinson disease ([place])   Memory disorder   HTN (hypertension)   Acute lower UTI   1. Sepsis without septic shock likely secondary to UTI a. Sepsis criteria: fever, tachycardia, tachypnea, leukocytosis, positive UA b. Will start IV fluids per sepsis protocol c. Continue ceftriaxone d. Hold vancmycin e. Blood and urine cultures  2. Recurrent Urinary retention a. Occurred post op one month ago and recently had his foley removed 24-48 hours ago b. Bladder scanned for 700 mL with good urinary output with foley reinsertion c. Continue foley d. Continue flomax e. Close outpatient urology follow up  3. Generalized weakness, likely from sepsis a. PT eval  4. Elevated Creatinine, likely from urinary retention a. Baseline Creatinine 0.9 (06/2019), currently 1.27 b. Hold home losartan/HCTZ c. Renal US to evaluate for hydro d. Follow up in AM  5. Parkinson's  Memory deficit  a. Outpatient Neurology follow up b. Continue home meds  6. Hypertension a. BP on the soft side b. Holding home  losartan/HCTZ  7. Hyponatremia a. IV fluids    DVT prophylaxis: lovenox   Code Status: DNR  Diet: heart healthy Family Communication: Admission, patients condition and plan of care including tests being ordered have been discussed with the patient who indicates understanding and agrees with the plan and Code Status. Patient's wife was updated  Disposition Plan: The appropriate patient status for this patient is INPATIENT. Inpatient status is judged to be reasonable and necessary in order to provide the required intensity of service to ensure the patient's safety. The patient's presenting symptoms, physical exam findings, and initial radiographic and laboratory data in the context of their chronic comorbidities is felt to place them at high risk for further clinical deterioration. Furthermore, it is not anticipated that the patient will be medically stable for discharge from the hospital within 2 midnights of admission. The following factors support the patient status of inpatient.   " The patient's presenting symptoms include generalized weakness. " The worrisome physical exam findings include abdominal discomfort. " The initial radiographic and laboratory data are worrisome because of elevated creatinine, leukocytosis, fever. " The chronic co-morbidities include parkinson's, memory deficit.   * I certify that at the point of admission it is my clinical judgment that the patient will require inpatient hospital care spanning beyond 2 midnights from the point of admission due to high intensity of service, high risk for further deterioration and high frequency of surveillance required.*   Status is: Inpatient  Remains inpatient appropriate because:IV treatments appropriate due to intensity of illness or inability to take PO and Inpatient level of care appropriate due to severity of illness   Dispo: The patient is from: Home              Anticipated d/c is to: Home              Patient  currently is not medically stable to d/c.   Difficult to place patient No     Consultants  . none  Procedures  . Foley catheter  Time Spent on  Admission: 64 minutes    Harold Hedge, DO [facility]  03/09/2021, 4:12 PM

## 2021-03-09 NOTE — ED Provider Notes (Signed)
[facility] DEPT Provider Note   CSN: [id number] Arrival date & time: 03/09/21  1244     History Chief Complaint  Patient presents with  . Fever    Jacob Bolton is a 80 y.o. male.  Patient with history of Parkinson's dementia, high blood pressure, urinary retention presents the emergency department today for evaluation of fever.  Initial information obtained by EMS.  Patient was weak this morning and could not get out of bed.  He was found to have a fever.  They reported that he had a urinary catheter that was removed yesterday.  He has had some dribbling since that time.  Patient complains of abdominal discomfort, no vomiting or diarrhea.  Tylenol given prior to arrival.  No cough or shortness of breath.  The onset of this condition was acute. The course is worsening. Aggravating factors: none. Alleviating factors: none.          Past Medical History:  Diagnosis Date  . High blood pressure   . Memory disorder 09/30/2019  . Parkinson disease ([place]) 12/03/2016    Patient Active Problem List   Diagnosis Date Noted  . Liposarcoma of right thigh ([place]) 11/02/2020  . Memory disorder 09/30/2019  . Deep venous thrombosis ([place]) 12/16/2018  . Lipoma of right lower extremity 05/20/2017  . Parkinson disease ([place]) 12/03/2016  . BPH (benign prostatic hyperplasia) 06/16/2013  . HTN (hypertension) 06/16/2013  . Obesity (BMI 30-39.9) 06/16/2013  . 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 Lipoma 06/02/2012  . 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 CERVICAL DISC SURGERY  2007   Ruptured disc, plates and screws  . COLONOSCOPY  2008  . 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 Leonides Schanz, [facility]       Family History  Problem Relation Age of Onset  . High blood pressure Mother   . Anxiety disorder Mother   . High blood  pressure Father   . Heart attack Father     Social History   Tobacco Use  . Smoking status: Former Research scientist (life sciences)  . Smokeless tobacco: Never Used  Substance Use Topics  . Alcohol use: Yes    Alcohol/week: 3.0 standard drinks    Types: 3 Glasses of wine per week    Comment: WIne 2-3 nights per week  . Drug use: No    Home Medications Prior to Admission medications   Medication Sig Start Date End Date Taking? Authorizing Provider  Azelastine HCl 0.15 % SOLN  11/11/16   [provider]  carbidopa-levodopa (SINEMET) 25-250 MG tablet Take 1 tablet by mouth 3 (three) times daily. 12/04/20   Kathrynn Ducking, MD  Cholecalciferol (VITAMIN D-1000 MAX ST) 1000 units tablet Take by mouth.    [provider]  donepezil (ARICEPT) 5 MG tablet Take 1 tablet (5 mg total) by mouth daily after breakfast. 02/23/21   Kathrynn Ducking, MD  dutasteride (AVODART) 0.5 MG capsule  10/23/16   [provider]  fluticasone Asencion Islam) 50 MCG/ACT nasal spray  10/28/16   [provider]  HYDROcodone-acetaminophen (NORCO/VICODIN) 5-325 MG tablet Take 1 tablet by mouth every 6 (six) hours as needed. 07/06/20   [provider]  losartan-hydrochlorothiazide (HYZAAR) 100-12.5 MG tablet Take by mouth.    [provider]  meloxicam (MOBIC) 15 MG tablet TAKE 1 TABLET BY MOUTH EVERY DAY  WITH FOOD 01/05/21   Magnus Sinning, MD  pramipexole (MIRAPEX) 1 MG tablet TAKE 1 TABLET BY MOUTH THREE TIMES A DAY 10/24/20   Kathrynn Ducking, MD  silodosin (RAPAFLO) 8 MG CAPS capsule Take 8 mg by mouth daily. 02/21/21   [provider]  terazosin (HYTRIN) 10 MG capsule Take 10 mg by mouth. 05/20/12   [provider]    Allergies    Donepezil and Sulfamethoxazole  Review of Systems   Review of Systems  Constitutional: Positive for fatigue, fever and unexpected weight change.  HENT: Negative for rhinorrhea and sore throat.   Eyes: Negative for redness.  Respiratory:  Negative for cough and shortness of breath.   Cardiovascular: Negative for chest pain.  Gastrointestinal: Positive for abdominal pain. Negative for diarrhea, nausea and vomiting.  Genitourinary: Positive for difficulty urinating. Negative for dysuria and hematuria.  Musculoskeletal: Negative for myalgias.  Skin: Negative for rash.  Neurological: Positive for weakness. Negative for headaches.    Physical Exam Updated Vital Signs BP 140/86   Pulse (!) 107   Temp (!) 100.9 F (38.3 C) (Rectal)   Resp 16   Ht 5\' 11"  (1.803 m)   Wt 93.4 kg   SpO2 95%   BMI 28.73 kg/m   Physical Exam Vitals and nursing note reviewed.  Constitutional:      Appearance: He is well-developed.     Comments: Warm to touch  HENT:     Head: Normocephalic and atraumatic.  Eyes:     General:        Right eye: No discharge.        Left eye: No discharge.     Conjunctiva/sclera: Conjunctivae normal.  Cardiovascular:     Rate and Rhythm: Regular rhythm. Tachycardia present.     Heart sounds: Normal heart sounds.  Pulmonary:     Effort: Pulmonary effort is normal.     Breath sounds: Normal breath sounds.  Abdominal:     Palpations: Abdomen is soft.     Tenderness: There is abdominal tenderness.     Comments: Palpable bladder with discomfort lower abdomen  Musculoskeletal:     Cervical back: Normal range of motion and neck supple.  Skin:    General: Skin is warm and dry.     Comments: Mild petechiae bilateral ankles.   There is a mild rash on left thigh at site of previous leg bag band.   Mild firmness without infectious changes posterior R thigh at site of previous surgery.   Neurological:     Mental Status: He is alert.     ED Results / Procedures / Treatments   Labs (all labs ordered are listed, but only abnormal results are displayed) Labs Reviewed  COMPREHENSIVE METABOLIC PANEL - Abnormal; Notable for the following components:      Result Value   Sodium 127 (*)    Chloride 94 (*)     Glucose, Bld 114 (*)    Creatinine, Ser 1.27 (*)    Calcium 8.8 (*)    Total Bilirubin 1.6 (*)    GFR, Estimated 57 (*)    All other components within normal limits  CBC WITH DIFFERENTIAL/PLATELET - Abnormal; Notable for the following components:   WBC 27.1 (*)    RBC 3.93 (*)    Hemoglobin 12.0 (*)    HCT 35.0 (*)    Neutro Abs 25.4 (*)    Lymphs Abs 0.2 (*)    Monocytes Absolute 1.3 (*)    Abs Immature  Granulocytes 0.20 (*)    All other components within normal limits  URINALYSIS, ROUTINE W REFLEX MICROSCOPIC - Abnormal; Notable for the following components:   Hgb urine dipstick SMALL (*)    Ketones, ur 5 (*)    Protein, ur 30 (*)    Leukocytes,Ua TRACE (*)    Bacteria, UA MANY (*)    All other components within normal limits  CULTURE, BLOOD (SINGLE)  URINE CULTURE  RESP PANEL BY RT-PCR (FLU A&B, COVID) ARPGX2  CULTURE, BLOOD (SINGLE)  LACTIC ACID, PLASMA  PROTIME-INR  APTT  LACTIC ACID, PLASMA    EKG EKG Interpretation  Date/Time:  Friday March 09 2021 13:09:27 EST Ventricular Rate:  113 PR Interval:    QRS Duration: 91 QT Interval:  334 QTC Calculation: 458 R Axis:   -39 Text Interpretation: Sinus tachycardia Left axis deviation Since last tracing rate faster Confirmed by Dorie Rank [phone]) on 03/09/2021 1:20:00 PM   Radiology DG Chest Port 1 View  Result Date: 03/09/2021 CLINICAL DATA:  Weakness EXAM: PORTABLE CHEST 1 VIEW COMPARISON:  Chest radiograph September 25, 2016 and chest CT February 23, 2019 FINDINGS: Apparent cardiac enlargement, likely accentuated by portable technique. No lobar consolidation. No significant pleural effusion or visible pneumothorax. Partially visualized anterior spinal fixation hardware. IMPRESSION: No active disease. Electronically Signed   By: Dahlia Bailiff MD   On: 03/09/2021 13:41    Procedures Procedures   Medications Ordered in ED Medications  lactated ringers infusion (has no administration in time range)  vancomycin  (VANCOCIN) IVPB 1000 mg/200 mL premix (has no administration in time range)  lactated ringers bolus 500 mL (has no administration in time range)  cefTRIAXone (ROCEPHIN) 1 g in sodium chloride 0.9 % 100 mL IVPB (1 g Intravenous New Bag/Given 03/09/21 1432)    ED Course  I have reviewed the triage vital signs and the nursing notes.  Pertinent labs & imaging results that were available during my care of the patient were reviewed by me and considered in my medical decision making (see chart for details).  Patient seen and examined. Work-up initiated.  Concern for sepsis given fever.  Palpable bladder, with greater than 700 cc urine.  Foley order placed.  Rocephin ordered.  Chest x-ray reviewed and is clear without signs of pneumonia.  Vital signs reviewed and are as follows: BP 140/86   Pulse (!) 107   Temp (!) 100.9 F (38.3 C) (Rectal)   Resp 16   Ht 5\' 11"  (1.803 m)   Wt 93.4 kg   SpO2 95%   BMI 28.73 kg/m   Family at bedside -- additional history obtained.  On reexam, abdomen is soft and distended bladder is now relieved after Foley placement.  Rocephin ordered on arrival, broadened with vancomycin awaiting further work-up.   3:23 PM UA appears infected. Will request admit for sepsis.   3:37 PM Spoke with hospitalist who will see.   CRITICAL CARE Performed by: Carlisle Cater PA-C Total critical care time: 40 minutes Critical care time was exclusive of separately billable procedures and treating other patients. Critical care was necessary to treat or prevent imminent or life-threatening deterioration. Critical care was time spent personally by me on the following activities: development of treatment plan with patient and/or surrogate as well as nursing, discussions with consultants, evaluation of patient's response to treatment, examination of patient, obtaining history from patient or surrogate, ordering and performing treatments and interventions, ordering and review of laboratory  studies, ordering and review of radiographic studies,  pulse oximetry and re-evaluation of patient's condition.     MDM Rules/Calculators/A&P                          Admit   Final Clinical Impression(s) / ED Diagnoses Final diagnoses:  Sepsis with acute renal failure without septic shock, due to unspecified organism, unspecified acute renal failure type [facility])  Urinary tract infection without hematuria, site unspecified    Rx / DC Orders ED Discharge Orders    None       Carlisle Cater, Hershal Coria 03/09/21 1538    Dorie Rank, MD 03/10/21 [phone]

## 2021-03-10 DIAGNOSIS — R338 Other retention of urine: Secondary | ICD-10-CM

## 2021-03-10 DIAGNOSIS — N179 Acute kidney failure, unspecified: Secondary | ICD-10-CM | POA: Diagnosis present

## 2021-03-10 LAB — BASIC METABOLIC PANEL
Anion gap: 9 (ref 5–15)
BUN: 20 mg/dL (ref 8–23)
CO2: 26 mmol/L (ref 22–32)
Calcium: 8.8 mg/dL — ABNORMAL LOW (ref 8.9–10.3)
Chloride: 95 mmol/L — ABNORMAL LOW (ref 98–111)
Creatinine, Ser: 0.99 mg/dL (ref 0.61–1.24)
GFR, Estimated: >60 mL/min (ref >60)
Glucose, Bld: 111 mg/dL — ABNORMAL HIGH (ref 70–99)
Potassium: 3.4 mmol/L — ABNORMAL LOW (ref 3.5–5.1)
Sodium: 130 mmol/L — ABNORMAL LOW (ref 135–145)

## 2021-03-10 LAB — CBC
HCT: 33.9 % — ABNORMAL LOW (ref 39.0–52.0)
Hemoglobin: 11.4 g/dL — ABNORMAL LOW (ref 13.0–17.0)
MCH: 30.8 pg (ref 26.0–34.0)
MCHC: 33.6 g/dL (ref 30.0–36.0)
MCV: 91.6 fL (ref 80.0–100.0)
Platelets: 163 10*3/uL (ref 150–400)
RBC: 3.7 MIL/uL — ABNORMAL LOW (ref 4.22–5.81)
RDW: 13.2 % (ref 11.5–15.5)
WBC: 16.7 10*3/uL — ABNORMAL HIGH (ref 4.0–10.5)
nRBC: 0 % (ref 0.0–0.2)

## 2021-03-10 MED ORDER — ENSURE ENLIVE PO LIQD
237.0000 mL | Freq: Three times a day (TID) | ORAL | Status: DC
  Administered 2021-03-10 – 2021-03-12 (×5): 237 mL via ORAL

## 2021-03-10 MED ORDER — ADULT MULTIVITAMIN W/MINERALS CH
1.0000 | ORAL_TABLET | Freq: Every day | ORAL | Status: DC
  Administered 2021-03-10 – 2021-03-12 (×3): 1 via ORAL
  Filled 2021-03-10 (×3): qty 1

## 2021-03-10 MED ORDER — CHLORHEXIDINE GLUCONATE CLOTH 2 % EX PADS
6.0000 | MEDICATED_PAD | Freq: Every day | CUTANEOUS | Status: DC
  Administered 2021-03-10 – 2021-03-12 (×3): 6 via TOPICAL

## 2021-03-10 NOTE — Progress Notes (Addendum)
Initial Nutrition Assessment  DOCUMENTATION CODES:   Not applicable  INTERVENTION:   Recommend diet liberalization to optimize PO intake  Ensure Enlive po TID, each supplement provides 350 kcal and 20 grams of protein  Magic cup TID with meals, each supplement provides 290 kcal and 9 grams of protein  MVI with minerals daily  NUTRITION DIAGNOSIS:   Unintentional weight loss related to decreased appetite as evidenced by per patient/family report.  GOAL:   Patient will meet greater than or equal to 90% of their needs  MONITOR:   PO intake,Supplement acceptance,Labs,Weight trends,I & O's  REASON FOR ASSESSMENT:   Malnutrition Screening Tool    ASSESSMENT:   Pt admitted with sepsis without septic shock, likely 2/2 UTI. PMH includes Parkinson's disease, memory deficit, HTN, BPH, liposarcoma of R thigh s/p resection 02/05/21 with post-op urinary retention.  Pt's wife reports pt waxing and waning at baseline. Currently he is fairly A&O. Per pt's wife, pt's appetite is "off and on." At times, he eats very well, and at other times, it is difficult for him to meet his nutritional needs. Pt's wife also reports that pt has experienced weight loss over the last few months. Per weight readings, pt weighed 103.9 kg on 12/04/20 and now weighs 93.4 kg, indicating a 9.88% wt loss x3 months, which is significant for time frame. Suspect pt meets criteria for malnutrition; however, unable to confirm at this time without nutrition-focused physical exam (cannot be performed at this time as RD is working remotely from another campus). Will attempt at follow-up.    Since admit, pt has had 2 meals documented, both noted to be 100% meal completion. However, RD will order oral nutrition supplements to provide additional kcals/protein given possibility of pt's meal intake declining.   UOP: 1.2L x 24 hours  Medications reviewed. Labs: Na 130 (L), K+ 3.4 (L)  Diet Order:   Diet Order            Diet  Heart Room service appropriate? Yes; Fluid consistency: Thin  Diet effective now                 EDUCATION NEEDS:   No education needs have been identified at this time  Skin:  Skin Assessment: Reviewed RN Assessment  Last BM:  3/11 type 5  Height:   Ht Readings from Last 1 Encounters:  03/09/21 5\' 11"  (1.803 m)    Weight:   Wt Readings from Last 1 Encounters:  03/09/21 93.4 kg   BMI:  Body mass index is 28.73 kg/m.  Estimated Nutritional Needs:   Kcal:  5956-3875  Protein:  115-125 grams  Fluid:  >2L    Jacob Ina, MS, RD, LDN RD pager number and weekend/on-call pager number located in [place].

## 2021-03-10 NOTE — Evaluation (Signed)
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 Jacob Bolton is a pleasant 80 year old man who presents with above medical history. On evaluation patient able to follow all commands and answer questions. Alert and oriented - only had difficulty with hospital name. Patient supervision to transfer to side of bed - needing to use bed rails to assist with trunk lift off and negotiation. Patient min assist to stand from elevated bed height and min guard to ambulate in room with RW. Patient min assist for LB ADLs and able to stand at sink to perform grooming tasks. Patient appears to be near his baseline in regards to self care tasks- min assist for ADLs and use of a reacher at home. Patient needed some increased assistance for transfers. Will follow acutely in order to maintain self care abilities and functional mobility while in hospital.     Follow Up Recommendations  No OT follow up    Equipment Recommendations  Tub/shower seat    Recommendations for Other Services       Precautions / Restrictions Precautions Precautions: Fall Restrictions Weight Bearing Restrictions: No      Mobility Bed Mobility Overal bed mobility: Needs Assistance Bed Mobility: Supine to Sit     Supine to sit: Supervision;HOB elevated     General bed mobility comments: uses of bed rails to transfer himself into sitting with increased time.    Transfers Overall transfer level: Needs assistance Equipment used: Rolling walker (2 wheeled) Transfers: Sit to/from [facility] Sit to Stand: From elevated  surface;Min assist         General transfer comment: min assist to stand from elevated bed height and use of walker. verbal cues for hand placement for transfer and blocking of feet to keep feet from sliding. min guard with rw to ambulate to BR.    Balance Overall balance assessment: Mild deficits observed, not formally tested                                         ADL either performed or assessed with clinical judgement   ADL Overall ADL's : Needs assistance/impaired Eating/Feeding: Independent   Grooming: Supervision/safety;Standing Grooming Details (indicate cue type and reason): standing at sink Upper Body Bathing: Supervision/ safety   Lower Body Bathing: Minimal assistance   Upper Body Dressing : Supervision/safety   Lower Body Dressing: Minimal assistance Lower Body Dressing Details (indicate cue type and reason): to get heels into slippers. reports wife helps him with shoes...uses a grabber for donning pants. Toilet Transfer: Tour manager Toilet;Grab bars;RW   Toileting- Water quality scientist and Hygiene: Supervision/safety;Sit to/from stand               Vision Patient Visual Report: No change from baseline Additional Comments: reports difficulty with vision in low light     Perception     Praxis      Pertinent Vitals/Pain Pain Assessment: No/denies pain     Hand Dominance Right   Extremity/Trunk Assessment Upper Extremity Assessment Upper Extremity Assessment: Overall WFL for tasks  assessed   Lower Extremity Assessment Lower Extremity Assessment: Defer to PT evaluation   Cervical / Trunk Assessment Cervical / Trunk Assessment: Normal   Communication Communication Communication: No difficulties   Cognition Arousal/Alertness: Awake/alert Behavior During Therapy: WFL for tasks assessed/performed Overall Cognitive Status: Within Functional Limits for tasks assessed                                      General Comments       Exercises     Shoulder Instructions      Home Living Family/patient expects to be discharged to:: Private residence Living Arrangements: Spouse/significant other Available Help at Discharge: Family Type of Home: House Home Access: Stairs to enter Technical brewer of Steps: 4 Entrance Stairs-Rails: Left Home Layout: One level     Bathroom Shower/Tub: Occupational psychologist: Handicapped height     Home Equipment: Environmental consultant - 2 wheels          Prior Functioning/Environment Level of Independence: Independent with assistive device(s)        Comments: Uses walker to help get up out of bed and to steady himself initally. Uses grabber to assist with LB dressing. has min assist for LB ADLs.        OT Problem List: Impaired balance (sitting and/or standing)      OT Treatment/Interventions:      OT Goals(Current goals can be found in the care plan section) Acute Rehab OT Goals OT Goal Formulation: All assessment and education complete, DC therapy  OT Frequency:     Barriers to D/C:            Co-evaluation PT/OT/SLP Co-Evaluation/Treatment: Yes Reason for Co-Treatment: To address functional/ADL transfers;For patient/therapist safety          AM-PAC OT "6 Clicks" Daily Activity     Outcome Measure Help from another person eating meals?: None Help from another person taking care of personal grooming?: None Help from another person toileting, which includes using toliet, bedpan, or urinal?: None Help from another person bathing (including washing, rinsing, drying)?: A Little Help from another person to put on and taking off regular upper body clothing?: None Help from another person to put on and taking off regular lower body clothing?: A Little 6 Click Score: 22   End of Session Equipment Utilized During Treatment: Rolling walker Nurse Communication: Mobility status  Activity Tolerance: Patient tolerated treatment  well Patient left: in chair;with call bell/phone within reach;with chair alarm set  OT Visit Diagnosis: Unsteadiness on feet (R26.81)                Time: 7741-2878 OT Time Calculation (min): 23 min Charges:  OT General Charges $OT Visit: 1 Visit OT Evaluation $OT Eval Low Complexity: 1 Low  Aleeyah Bensen, OTR/L [place]  Office [phone] Pager: [place] 03/10/2021, 12:01 PM

## 2021-03-10 NOTE — Plan of Care (Signed)
  Problem: Education: Goal: Knowledge of General Education information will improve Description: Including pain rating scale, medication(s)/side effects and non-pharmacologic comfort measures Outcome: Progressing   Problem: Activity: Goal: Risk for activity intolerance will decrease Outcome: Progressing   

## 2021-03-10 NOTE — Evaluation (Signed)
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 Pt currently with functional limitations due to the deficits listed below (see PT Problem List). Pt will benefit from skilled PT to increase their independence and safety with mobility to allow discharge to the venue listed below.  Pt would benefit from HHPT for home safety assessment.     Follow Up Recommendations Home health PT    Equipment Recommendations  None recommended by PT    Recommendations for Other Services       Precautions / Restrictions Precautions Precautions: Fall Restrictions Weight Bearing Restrictions: No      Mobility  Bed Mobility Overal bed mobility: Needs Assistance Bed Mobility: Supine to Sit     Supine to sit: Supervision;HOB elevated     General bed mobility comments: Up in bathroom with OT upon arrival, but was S per OT report    Transfers Overall transfer level: Needs assistance Equipment used: Rolling walker (2 wheeled) Transfers: Sit to/from [employer] Transfers Sit to Stand: From elevated surface;Min assist         General transfer comment: Up in bathroom upon arrival with OT, but MIN sit > stand and blocking of feet to keep from sliding.  Ambulation/Gait   Gait Distance (Feet): 250 Feet Assistive device: Rolling walker (2 wheeled);None Gait Pattern/deviations: Step-through pattern;Decreased step length - right;Decreased step length - left     General Gait Details: Ambulated with RW halfway and then no assistive device with slightly decreased step length. One  slight episode of LOB, but minimal and pt able to self correct without PT help.  Stairs            Wheelchair Mobility    Modified Rankin (Stroke Patients Only)       Balance Overall balance assessment: Mild deficits observed, not formally tested                                           Pertinent Vitals/Pain Pain Assessment: No/denies pain    Home Living Family/patient expects to be discharged to:: Private residence Living Arrangements: Spouse/significant other Available Help at Discharge: Family Type of Home: House Home Access: Stairs to enter Entrance Stairs-Rails: Left Entrance Stairs-Number of Steps: 4 Home Layout: One level Home Equipment: Walker - 2 wheels      Prior Function Level of Independence: Independent with assistive device(s)         Comments: Uses walker to help get up out of bed and to steady himself initally. Uses grabber to assist with LB dressing. has min assist for LB ADLs.     Hand Dominance   Dominant Hand: Right    Extremity/Trunk Assessment   Upper Extremity Assessment Upper Extremity Assessment: Overall WFL for tasks assessed    Lower Extremity Assessment Lower Extremity Assessment: Overall WFL for tasks assessed    Cervical / Trunk Assessment Cervical / Trunk Assessment: Normal  Communication   Communication: No difficulties  Cognition Arousal/Alertness: Awake/alert Behavior During Therapy: WFL for tasks assessed/performed Overall Cognitive Status: Within Functional Limits  for tasks assessed                                        General Comments      Exercises     Assessment/Plan    PT Assessment Patient needs continued PT services  PT Problem List Decreased strength;Decreased balance;Decreased mobility;Decreased activity tolerance       PT Treatment Interventions DME instruction;Gait training;Stair training;Functional mobility training;Therapeutic activities;Therapeutic  exercise;Balance training;Neuromuscular re-education;Patient/family education    PT Goals (Current goals can be found in the Care Plan section)  Acute Rehab PT Goals Patient Stated Goal: home PT Goal Formulation: With patient Time For Goal Achievement: 03/24/21 Potential to Achieve Goals: Good    Frequency Min 3X/week   Barriers to discharge        Co-evaluation PT/OT/SLP Co-Evaluation/Treatment: Yes Reason for Co-Treatment: For patient/therapist safety;To address functional/ADL transfers PT goals addressed during session: Mobility/safety with mobility;Proper use of DME;Balance         AM-PAC PT "6 Clicks" Mobility  Outcome Measure Help needed turning from your back to your side while in a flat bed without using bedrails?: A Little Help needed moving from lying on your back to sitting on the side of a flat bed without using bedrails?: A Little Help needed moving to and from a bed to a chair (including a wheelchair)?: A Little Help needed standing up from a chair using your arms (e.g., wheelchair or bedside chair)?: A Little Help needed to walk in hospital room?: A Little Help needed climbing 3-5 steps with a railing? : A Little 6 Click Score: 18    End of Session   Activity Tolerance: Patient tolerated treatment well Patient left: in chair;with call bell/phone within reach (no chair pads available. nursing aware pt in chair) Nurse Communication: Mobility status PT Visit Diagnosis: Muscle weakness (generalized) (M62.81);Difficulty in walking, not elsewhere classified (R26.2)    Time: 5701-7793 PT Time Calculation (min) (ACUTE ONLY): 13 min   Charges:   PT Evaluation $PT Eval Low Complexity: 1 Low          Zsazsa Bahena L. Tamala Julian, [place] Pager [phone] 03/10/2021   Galen [place] 03/10/2021, 1:25 PM

## 2021-03-10 NOTE — Progress Notes (Signed)
[facility]                                                                              Patient Demographics  Jacob Bolton, is a 80 y.o. male, DOB - 10-11-1941, TFT:[id number]  Admit date - 03/09/2021   Admitting Physician Harold Hedge, MD  Outpatient Primary MD for the patient is Hulan Fess, MD  Outpatient specialists:   LOS - 1  days   Medical records reviewed and are as summarized below:    Chief Complaint  Patient presents with  . 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  Patient had the foley catheter removed before admission and has had decreased urinary output with some dribbling since that time. He had a follow up urology appointment on admission but was too weak at home to go to the appointment, so he was brought to the ED. also complained of mild abdominal discomfort. Bladder scan showed 700 mL and Foley catheter was placed.  Creatinine 1.27 WBCs 27 Sodium 127, potassium 3.6.  UA positive for UTI   Assessment & Plan    Principal Problem:   Sepsis ([place]) without septic shock, present on admission, likely secondary to UTI -Patient met sepsis criteria with fever, tachycardia, tachypnea, leukocytosis, UA positive for UTI -Renal ultrasound showed no hydronephrosis or obstruction -Placed on IV fluid hydration, started on IV Rocephin -Follow blood cultures, urine cultures -Follow CBC, sepsis physiology resolved  Active Problems:    Acute urinary retention, recurrent, in the setting of BPH, UTI -Occurred postop 1 month ago and had his Foley removed before admission.  Bladder scan showed 700 mL.  Foley reinserted -Continue Foley, outpatient urology follow-up (Dr [facility]) for voiding  trial -Started on Flomax -Continue IV Rocephin, follow cultures    AKI (acute kidney injury) ([place]) -Likely due to #1, continue to hold losartan, HCTZ, meloxicam -Repeat BMET     Parkinson disease ([place]) with memory deficit -Currently fairly alert and oriented.  Discussed with patient's wife, at baseline he has waxing and waning mental status, currently at baseline -Continue Sinemet, Aricept     HTN (hypertension) BP currently stable, continue to hold losartan/HCTZ    Code Status: DNR DVT Prophylaxis:  enoxaparin (LOVENOX) injection 40 mg Start: 03/09/21 2200   Level of Care: Level of care: Med-Surg Family Communication: Discussed all imaging results, lab results, explained to the patient's wife on the phone (number [phone])   Disposition Plan:     Status is: Inpatient  Remains inpatient appropriate because:Inpatient level of care appropriate due to severity of illness   Dispo: The patient is from: Home              Anticipated d/c is to: Home              Patient currently is not medically stable to d/c.  Awaiting urine culture and sensitivities   Difficult to  place patient No      Time Spent in minutes   35 minutes  Procedures:  Renal ultrasound  Consultants:   None  Antimicrobials:   Anti-infectives (From admission, onward)   Start     Dose/Rate Route Frequency Ordered Stop   03/10/21 1400  cefTRIAXone (ROCEPHIN) 1 g in sodium chloride 0.9 % 100 mL IVPB        1 g 200 mL/hr over 30 Minutes Intravenous Every 24 hours 03/09/21 1558     03/09/21 1445  vancomycin (VANCOCIN) IVPB 1000 mg/200 mL premix  Status:  Discontinued        1,000 mg 200 mL/hr over 60 Minutes Intravenous  Once 03/09/21 1434 03/09/21 1558   03/09/21 1330  cefTRIAXone (ROCEPHIN) 1 g in sodium chloride 0.9 % 100 mL IVPB        1 g 200 mL/hr over 30 Minutes Intravenous  Once 03/09/21 1317 03/09/21 1502         Medications  Scheduled Meds: . carbidopa-levodopa  1 tablet Oral TID   . Chlorhexidine Gluconate Cloth  6 each Topical Daily  . donepezil  5 mg Oral QPC breakfast  . enoxaparin (LOVENOX) injection  40 mg Subcutaneous Q24H  . tamsulosin  0.4 mg Oral Daily   Continuous Infusions: . cefTRIAXone (ROCEPHIN)  IV     PRN Meds:.acetaminophen **OR** acetaminophen      Subjective:   Jacob Bolton was seen and examined today.  Fairly alert and oriented x3, no fevers or chills.  No acute issues.  Foley catheter in, draining, no hematuria.  Patient denies dizziness, chest pain, shortness of breath, abdominal pain, N/V/D/C, new weakness, numbess, tingling. No acute events overnight.    Objective:   Vitals:   03/09/21 2028 03/09/21 2311 03/10/21 0209 03/10/21 0604  BP:  105/67 125/77 127/76  Pulse:  86 78 77  Resp: _0 Temp:  98.3 F (36.8 C) 98.5 F (36.9 C) 98.1 F (36.7 C)  TempSrc:  Oral    SpO2:  99% 95% 96%  Weight:      Height:        Intake/Output Summary (Last 24 hours) at 03/10/2021 1017 Last data filed at 03/10/2021 0900 Gross per 24 hour  Intake 120 ml  Output 1700 ml  Net -1580 ml     Wt Readings from Last 3 Encounters:  03/09/21 93.4 kg  12/04/20 103.9 kg  06/19/20 105.2 kg     Exam  General: Alert and oriented x 3, NAD  Cardiovascular: S1 S2 auscultated, no murmurs, RRR  Respiratory: Clear to auscultation bilaterally, no wheezing, rales or rhonchi  Gastrointestinal: Soft, nontender, nondistended, + bowel sounds  Ext: no pedal edema bilaterally  Neuro: new deficits  Musculoskeletal: No digital cyanosis, clubbing  Skin: No rashes  Psych: Normal affect and demeanor, alert and oriented x3    Data Reviewed:  I have personally reviewed following labs and imaging studies  Micro Results Recent Results (from the past 240 hour(s))  Resp Panel by RT-PCR (Flu A&B, Covid) Nasopharyngeal Swab     Status: None   Collection Time: 03/09/21  4:04 PM   Specimen: Nasopharyngeal Swab; Nasopharyngeal(NP) swabs in vial  transport medium  Result Value Ref Range Status   SARS Coronavirus 2 by RT PCR NEGATIVE NEGATIVE Final    Comment: (NOTE) SARS-CoV-2 target nucleic acids are NOT DETECTED.  The SARS-CoV-2 RNA is generally detectable in upper respiratory specimens during the acute phase of infection. The lowest concentration of  SARS-CoV-2 viral copies this assay can detect is 138 copies/mL. A negative result does not preclude SARS-Cov-2 infection and should not be used as the sole basis for treatment or other patient management decisions. A negative result may occur with  improper specimen collection/handling, submission of specimen other than nasopharyngeal swab, presence of viral mutation(s) within the areas targeted by this assay, and inadequate number of viral copies(<138 copies/mL). A negative result must be combined with clinical observations, patient history, and epidemiological information. The expected result is Negative.  Fact Sheet for Patients:  EntrepreneurPulse.com.au  Fact Sheet for Healthcare Providers:  IncredibleEmployment.be  This test is no t yet approved or cleared by the [place] FDA and  has been authorized for detection and/or diagnosis of SARS-CoV-2 by FDA under an Emergency Use Authorization (EUA). This EUA will remain  in effect (meaning this test can be used) for the duration of the COVID-19 declaration under Section 564(b)(1) of the Act, 21 U.S.C.section 360bbb-3(b)(1), unless the authorization is terminated  or revoked sooner.       Influenza A by PCR NEGATIVE NEGATIVE Final   Influenza B by PCR NEGATIVE NEGATIVE Final    Comment: (NOTE) The Xpert Xpress SARS-CoV-2/FLU/RSV plus assay is intended as an aid in the diagnosis of influenza from Nasopharyngeal swab specimens and should not be used as a sole basis for treatment. Nasal washings and aspirates are unacceptable for Xpert Xpress SARS-CoV-2/FLU/RSV testing.  Fact  Sheet for Patients: EntrepreneurPulse.com.au  Fact Sheet for Healthcare Providers: IncredibleEmployment.be  This test is not yet approved or cleared by the [place] FDA and has been authorized for detection and/or diagnosis of SARS-CoV-2 by FDA under an Emergency Use Authorization (EUA). This EUA will remain in effect (meaning this test can be used) for the duration of the COVID-19 declaration under Section 564(b)(1) of the Act, 21 U.S.C. section 360bbb-3(b)(1), unless the authorization is terminated or revoked.  Performed at [facility], [place] [address] [place], [place] [address]     Radiology Reports US RENAL  Result Date: 03/09/2021 CLINICAL DATA:  Urinary retention. EXAM: RENAL / URINARY TRACT ULTRASOUND COMPLETE COMPARISON:  CT 02/23/2019 FINDINGS: Right Kidney: Renal measurements: 12.7 x 5.7 x 5.4 cm = volume: 204 mL. Borderline increased parenchymal echogenicity. Previous parapelvic cyst on CT is not well seen by ultrasound. No hydronephrosis. No visualized calculi or focal lesion. Left Kidney: Renal measurements: 12.3 x 6.2 x 5.3 cm = volume: 211 mL. Technically challenging assessment due to limited acoustic window and rib shadowing. Mild increased parenchymal echogenicity. No hydronephrosis. Previous parapelvic cysts are not visualized. More detailed assessment is limited. Bladder: Foley catheter within the urinary bladder. Other: None. IMPRESSION: 1. Foley catheter decompressing the urinary bladder. 2. No hydronephrosis. 3. Mild bilateral increased renal parenchymal echogenicity. Limited detailed left renal assessment due to limited acoustic window. 4. Peripelvic cysts on prior CT are not seen. Electronically Signed   By: Keith Rake M.D.   On: 03/09/2021 17:38   DG Chest Port 1 View  Result Date: 03/09/2021 CLINICAL DATA:  Weakness EXAM: PORTABLE CHEST 1 VIEW COMPARISON:  Chest radiograph September 25, 2016 and  chest CT February 23, 2019 FINDINGS: Apparent cardiac enlargement, likely accentuated by portable technique. No lobar consolidation. No significant pleural effusion or visible pneumothorax. Partially visualized anterior spinal fixation hardware. IMPRESSION: No active disease. Electronically Signed   By: Dahlia Bailiff MD   On: 03/09/2021 13:41    Lab Data:  CBC: Recent Labs  Lab 03/09/21 1327  WBC  27.1*  NEUTROABS 25.4*  HGB 12.0*  HCT 35.0*  MCV 89.1  PLT 276   Basic Metabolic Panel: Recent Labs  Lab 03/09/21 1327  NA 127*  K 3.6  CL 94*  CO2 22  GLUCOSE 114*  BUN 22  CREATININE 1.27*  CALCIUM 8.8*   GFR: Estimated Creatinine Clearance: 55 mL/min (A) (by C-G formula based on SCr of 1.27 mg/dL (H)). Liver Function Tests: Recent Labs  Lab 03/09/21 1327  AST 18  ALT <5  ALKPHOS 58  BILITOT 1.6*  PROT 7.0  ALBUMIN 3.9   No results for input(s): LIPASE, AMYLASE in the last 168 hours. No results for input(s): AMMONIA in the last 168 hours. Coagulation Profile: Recent Labs  Lab 03/09/21 1327  INR 1.2   Cardiac Enzymes: No results for input(s): CKTOTAL, CKMB, CKMBINDEX, TROPONINI in the last 168 hours. BNP (last 3 results) No results for input(s): PROBNP in the last 8760 hours. HbA1C: No results for input(s): HGBA1C in the last 72 hours. CBG: No results for input(s): GLUCAP in the last 168 hours. Lipid Profile: No results for input(s): CHOL, HDL, LDLCALC, TRIG, CHOLHDL, LDLDIRECT in the last 72 hours. Thyroid Function Tests: No results for input(s): TSH, T4TOTAL, FREET4, T3FREE, THYROIDAB in the last 72 hours. Anemia Panel: No results for input(s): VITAMINB12, FOLATE, FERRITIN, TIBC, IRON, RETICCTPCT in the last 72 hours. Urine analysis:    Component Value Date/Time   COLORURINE YELLOW 03/09/2021 1406   APPEARANCEUR CLEAR 03/09/2021 1406   LABSPEC 1.009 03/09/2021 1406   PHURINE 5.0 03/09/2021 1406   GLUCOSEU NEGATIVE 03/09/2021 1406   HGBUR SMALL (A)  03/09/2021 1406   BILIRUBINUR NEGATIVE 03/09/2021 1406   KETONESUR 5 (A) 03/09/2021 1406   PROTEINUR 30 (A) 03/09/2021 1406   NITRITE NEGATIVE 03/09/2021 1406   LEUKOCYTESUR TRACE (A) 03/09/2021 1406     Stormy Sabol M.D. [facility] 03/10/2021, 10:17 AM  Available via Epic secure chat 7am-7pm After 7 pm, please refer to night coverage provider listed on amion.

## 2021-03-11 LAB — BLOOD CULTURE ID PANEL (REFLEXED) - BCID2

## 2021-03-11 LAB — CBC
HCT: 30.1 % — ABNORMAL LOW (ref 39.0–52.0)
Hemoglobin: 10.2 g/dL — ABNORMAL LOW (ref 13.0–17.0)
MCH: 30.9 pg (ref 26.0–34.0)
MCHC: 33.9 g/dL (ref 30.0–36.0)
MCV: 91.2 fL (ref 80.0–100.0)
Platelets: 156 10*3/uL (ref 150–400)
RBC: 3.3 MIL/uL — ABNORMAL LOW (ref 4.22–5.81)
RDW: 13.2 % (ref 11.5–15.5)
WBC: 13.5 10*3/uL — ABNORMAL HIGH (ref 4.0–10.5)
nRBC: 0 % (ref 0.0–0.2)

## 2021-03-11 LAB — BASIC METABOLIC PANEL
Anion gap: 10 (ref 5–15)
BUN: 21 mg/dL (ref 8–23)
CO2: 26 mmol/L (ref 22–32)
Calcium: 8.4 mg/dL — ABNORMAL LOW (ref 8.9–10.3)
Chloride: 95 mmol/L — ABNORMAL LOW (ref 98–111)
Creatinine, Ser: 0.99 mg/dL (ref 0.61–1.24)
GFR, Estimated: >60 mL/min (ref >60)
Glucose, Bld: 101 mg/dL — ABNORMAL HIGH (ref 70–99)
Potassium: 3.5 mmol/L (ref 3.5–5.1)
Sodium: 131 mmol/L — ABNORMAL LOW (ref 135–145)

## 2021-03-11 MED ORDER — SODIUM CHLORIDE 0.9 % IV SOLN
2.0000 g | INTRAVENOUS | Status: DC
  Administered 2021-03-11: 2 g via INTRAVENOUS
  Filled 2021-03-11: qty 20
  Filled 2021-03-11: qty 2

## 2021-03-11 NOTE — Progress Notes (Signed)
[facility]                                                                              Patient Demographics  Jacob Bolton, is a 80 y.o. male, DOB - 1941-11-05, LGX:[id number]  Admit date - 03/09/2021   Admitting Physician Harold Hedge, MD  Outpatient Primary MD for the patient is Hulan Fess, MD  Outpatient specialists:   LOS - 2  days   Medical records reviewed and are as summarized below:    Chief Complaint  Patient presents with  . 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  Patient had the foley catheter removed before admission and has had decreased urinary output with some dribbling since that time. He had a follow up urology appointment on admission but was too weak at home to go to the appointment, so he was brought to the ED. also complained of mild abdominal discomfort. Bladder scan showed 700 mL and Foley catheter was placed.  Creatinine 1.27 WBCs 27 Sodium 127, potassium 3.6.  UA positive for UTI   Assessment & Plan    Principal Problem:   Sepsis ([place]) without septic shock, present on admission, likely secondary to UTI, Klebsiella bacteremia -Patient met sepsis criteria with fever, tachycardia, tachypnea, leukocytosis, UA positive for UTI -Renal ultrasound showed no hydronephrosis or obstruction -Patient was placed on IV Rocephin, increase to 2 g IV daily -Blood cultures 1/4 GNR, Klebsiella -Very weak and deconditioned, max 2 person assist, not at baseline, PT recommending home health  Active Problems:    Acute urinary retention, recurrent, in the setting of BPH, UTI -Occurred postop 1 month ago and had his Foley removed before admission.  Bladder scan showed 700 mL.  Foley reinserted -Continue  Foley, outpatient urology follow-up (Dr [facility]) for voiding trial -Continue Flomax    AKI (acute kidney injury) ([place]) -Likely due to #1, continue to hold losartan, HCTZ, meloxicam -Resolved, creatinine 0.9     Parkinson disease ([place]) with memory deficit -Currently fairly alert and oriented.  Discussed with patient's wife, at baseline he has waxing and waning mental status, currently at baseline -Continue Sinemet, Aricept     HTN (hypertension) BP currently stable, continue to hold lisinopril HCTZ    Code Status: DNR DVT Prophylaxis:  enoxaparin (LOVENOX) injection 40 mg Start: 03/09/21 2200   Level of Care: Level of care: Med-Surg Family Communication: Discussed all imaging results, lab results, explained to the patient's wife on the phone (number [phone])   Disposition Plan:     Status is: Inpatient  Remains inpatient appropriate because:Inpatient level of care appropriate due to severity of illness   Dispo: The patient is from: Home              Anticipated d/c is to: Home              Patient currently is not medically stable to  d/c.  Follow blood cultures sensitivities   Difficult to place patient No      Time Spent in minutes   35 minutes  Procedures:  Renal ultrasound  Consultants:   None  Antimicrobials:   Anti-infectives (From admission, onward)   Start     Dose/Rate Route Frequency Ordered Stop   03/11/21 1400  cefTRIAXone (ROCEPHIN) 2 g in sodium chloride 0.9 % 100 mL IVPB        2 g 200 mL/hr over 30 Minutes Intravenous Every 24 hours 03/11/21 0010     03/10/21 1400  cefTRIAXone (ROCEPHIN) 1 g in sodium chloride 0.9 % 100 mL IVPB  Status:  Discontinued        1 g 200 mL/hr over 30 Minutes Intravenous Every 24 hours 03/09/21 1558 03/11/21 0010   03/09/21 1445  vancomycin (VANCOCIN) IVPB 1000 mg/200 mL premix  Status:  Discontinued        1,000 mg 200 mL/hr over 60 Minutes Intravenous  Once 03/09/21 1434 03/09/21 1558    03/09/21 1330  cefTRIAXone (ROCEPHIN) 1 g in sodium chloride 0.9 % 100 mL IVPB        1 g 200 mL/hr over 30 Minutes Intravenous  Once 03/09/21 1317 03/09/21 1502         Medications  Scheduled Meds: . carbidopa-levodopa  1 tablet Oral TID  . Chlorhexidine Gluconate Cloth  6 each Topical Daily  . donepezil  5 mg Oral QPC breakfast  . enoxaparin (LOVENOX) injection  40 mg Subcutaneous Q24H  . feeding supplement  237 mL Oral TID BM  . multivitamin with minerals  1 tablet Oral Daily  . tamsulosin  0.4 mg Oral Daily   Continuous Infusions: . cefTRIAXone (ROCEPHIN)  IV 2 g (03/11/21 1332)   PRN Meds:.acetaminophen **OR** acetaminophen      Subjective:   Sven Pinheiro was seen and examined today.  No fevers or chills.  No acute issues overnight.  No hematuria.  + Generalized weakness, does not feel at baseline yet  Objective:   Vitals:   03/10/21 1420 03/10/21 2054 03/11/21 0517 03/11/21 1402  BP: (!) 142/81 124/63 138/85 130/78  Pulse: 83 82 80 75  Resp: _0 Temp: 98.6 F (37 C) 99.3 F (37.4 C) 98.7 F (37.1 C) 98.8 F (37.1 C)  TempSrc:  Oral Oral Oral  SpO2: 99% 100% 98% 99%  Weight:      Height:        Intake/Output Summary (Last 24 hours) at 03/11/2021 1524 Last data filed at 03/11/2021 1100 Gross per 24 hour  Intake 960 ml  Output 3300 ml  Net -2340 ml     Wt Readings from Last 3 Encounters:  03/09/21 93.4 kg  12/04/20 103.9 kg  06/19/20 105.2 kg   Physical Exam  General: Alert and oriented x 3, NAD, deconditioned  Cardiovascular: S1 S2 clear, RRR. No pedal edema b/l  Respiratory: CTAB, no wheezing, rales or rhonchi  Gastrointestinal: Soft, nontender, nondistended, NBS  Ext: no pedal edema bilaterally  Neuro: no new deficits  Musculoskeletal: No cyanosis, clubbing  Skin: No rashes  Psych: Normal affect and demeanor, alert and oriented x3    Data Reviewed:  I have personally reviewed following labs and imaging studies  Micro  Results Recent Results (from the past 240 hour(s))  Blood culture (routine single)     Status: Abnormal (Preliminary result)   Collection Time: 03/09/21  1:30 PM   Specimen: BLOOD RIGHT ARM  Result  Value Ref Range Status   Specimen Description   Final    BLOOD RIGHT ARM Performed at [facility] [address] [place], [place] [address]    Special Requests   Final    BOTTLES DRAWN AEROBIC AND ANAEROBIC Blood Culture adequate volume Performed at [facility] [address] [place], [place] [address]    Culture  Setup Time   Final    GRAM NEGATIVE RODS ANAEROBIC BOTTLE ONLY CRITICAL RESULT CALLED TO, READ BACK BY AND VERIFIED WITH: E. JACKSON,PHARMD 0006 03/11/2021 T. TYSOR    Culture (A)  Final    KLEBSIELLA OXYTOCA SUSCEPTIBILITIES TO FOLLOW Performed at [facility], [place] [address] [place], [place] [address]    Report Status PENDING  Incomplete  Blood Culture ID Panel (Reflexed)     Status: Abnormal   Collection Time: 03/09/21  1:30 PM  Result Value Ref Range Status   Enterococcus faecalis NOT DETECTED NOT DETECTED Final   Enterococcus Faecium NOT DETECTED NOT DETECTED Final   Listeria monocytogenes NOT DETECTED NOT DETECTED Final   Staphylococcus species NOT DETECTED NOT DETECTED Final   Staphylococcus aureus (BCID) NOT DETECTED NOT DETECTED Final   Staphylococcus epidermidis NOT DETECTED NOT DETECTED Final   Staphylococcus lugdunensis NOT DETECTED NOT DETECTED Final   Streptococcus species NOT DETECTED NOT DETECTED Final   Streptococcus agalactiae NOT DETECTED NOT DETECTED Final   Streptococcus pneumoniae NOT DETECTED NOT DETECTED Final   Streptococcus pyogenes NOT DETECTED NOT DETECTED Final   A.calcoaceticus-baumannii NOT DETECTED NOT DETECTED Final   Bacteroides fragilis NOT DETECTED NOT DETECTED Final   Enterobacterales DETECTED (A) NOT DETECTED Final    Comment: Enterobacterales represent a large order of gram negative  bacteria, not a single organism. CRITICAL RESULT CALLED TO, READ BACK BY AND VERIFIED WITH: E. JACKSON,PHARMD 0006 03/11/2021 T. TYSOR    Enterobacter cloacae complex NOT DETECTED NOT DETECTED Final   Escherichia coli NOT DETECTED NOT DETECTED Final   Klebsiella aerogenes NOT DETECTED NOT DETECTED Final   Klebsiella oxytoca DETECTED (A) NOT DETECTED Final    Comment: CRITICAL RESULT CALLED TO, READ BACK BY AND VERIFIED WITH: E. JACKSON,PHARMD 0006 03/11/2021 T. TYSOR    Klebsiella pneumoniae NOT DETECTED NOT DETECTED Final   Proteus species NOT DETECTED NOT DETECTED Final   Salmonella species NOT DETECTED NOT DETECTED Final   Serratia marcescens NOT DETECTED NOT DETECTED Final   Haemophilus influenzae NOT DETECTED NOT DETECTED Final   Neisseria meningitidis NOT DETECTED NOT DETECTED Final   Pseudomonas aeruginosa NOT DETECTED NOT DETECTED Final   Stenotrophomonas maltophilia NOT DETECTED NOT DETECTED Final   Candida albicans NOT DETECTED NOT DETECTED Final   Candida auris NOT DETECTED NOT DETECTED Final   Candida glabrata NOT DETECTED NOT DETECTED Final   Candida krusei NOT DETECTED NOT DETECTED Final   Candida parapsilosis NOT DETECTED NOT DETECTED Final   Candida tropicalis NOT DETECTED NOT DETECTED Final   Cryptococcus neoformans/gattii NOT DETECTED NOT DETECTED Final   CTX-M ESBL NOT DETECTED NOT DETECTED Final   Carbapenem resistance IMP NOT DETECTED NOT DETECTED Final   Carbapenem resistance KPC NOT DETECTED NOT DETECTED Final   Carbapenem resistance NDM NOT DETECTED NOT DETECTED Final   Carbapenem resist OXA 48 LIKE NOT DETECTED NOT DETECTED Final   Carbapenem resistance VIM NOT DETECTED NOT DETECTED Final    Comment: Performed at [facility], [address], [place], [place] [address]  Urine culture     Status: Abnormal (  Preliminary result)   Collection Time: 03/09/21  2:06 PM   Specimen: In/Out Cath Urine  Result Value Ref Range Status   Specimen Description    Final    IN/OUT CATH URINE Performed at [facility], [place] [address], [place], [place] [address]    Special Requests   Final    NONE Performed at [facility], [place] [address], [place], [place] [address]    Culture (A)  Final    >=100,000 COLONIES/mL KLEBSIELLA OXYTOCA SUSCEPTIBILITIES TO FOLLOW Performed at [facility], [place] [address] [place], [place] [address]    Report Status PENDING  Incomplete  Culture, blood (single)     Status: None (Preliminary result)   Collection Time: 03/09/21  2:33 PM   Specimen: BLOOD RIGHT HAND  Result Value Ref Range Status   Specimen Description   Final    BLOOD RIGHT HAND Performed at [facility] [address] [place], [place] [address]    Special Requests   Final    BOTTLES DRAWN AEROBIC AND ANAEROBIC Blood Culture adequate volume Performed at [facility] [address], [place], [place] [address]    Culture   Final    NO GROWTH < 24 HOURS Performed at [facility] [address] [place], [place] [address]    Report Status PENDING  Incomplete  Resp Panel by RT-PCR (Flu A&B, Covid) Nasopharyngeal Swab     Status: None   Collection Time: 03/09/21  4:04 PM   Specimen: Nasopharyngeal Swab; Nasopharyngeal(NP) swabs in vial transport medium  Result Value Ref Range Status   SARS Coronavirus 2 by RT PCR NEGATIVE NEGATIVE Final    Comment: (NOTE) SARS-CoV-2 target nucleic acids are NOT DETECTED.  The SARS-CoV-2 RNA is generally detectable in upper respiratory specimens during the acute phase of infection. The lowest concentration of SARS-CoV-2 viral copies this assay can detect is 138 copies/mL. A negative result does not preclude SARS-Cov-2 infection and should not be used as the sole basis for treatment or other patient management decisions. A negative result may occur with  improper specimen collection/handling, submission of specimen other than  nasopharyngeal swab, presence of viral mutation(s) within the areas targeted by this assay, and inadequate number of viral copies(<138 copies/mL). A negative result must be combined with clinical observations, patient history, and epidemiological information. The expected result is Negative.  Fact Sheet for Patients:  EntrepreneurPulse.com.au  Fact Sheet for Healthcare Providers:  IncredibleEmployment.be  This test is no t yet approved or cleared by the [place] FDA and  has been authorized for detection and/or diagnosis of SARS-CoV-2 by FDA under an Emergency Use Authorization (EUA). This EUA will remain  in effect (meaning this test can be used) for the duration of the COVID-19 declaration under Section 564(b)(1) of the Act, 21 U.S.C.section 360bbb-3(b)(1), unless the authorization is terminated  or revoked sooner.       Influenza A by PCR NEGATIVE NEGATIVE Final   Influenza B by PCR NEGATIVE NEGATIVE Final    Comment: (NOTE) The Xpert Xpress SARS-CoV-2/FLU/RSV plus assay is intended as an aid in the diagnosis of influenza from Nasopharyngeal swab specimens and should not be used as a sole basis for treatment. Nasal washings and aspirates are unacceptable for Xpert Xpress SARS-CoV-2/FLU/RSV testing.  Fact Sheet for Patients: EntrepreneurPulse.com.au  Fact Sheet for Healthcare Providers: IncredibleEmployment.be  This test is not yet approved or cleared by the [place] and has been authorized  for detection and/or diagnosis of SARS-CoV-2 by FDA under an Emergency Use Authorization (EUA). This EUA will remain in effect (meaning this test can be used) for the duration of the COVID-19 declaration under Section 564(b)(1) of the Act, 21 U.S.C. section 360bbb-3(b)(1), unless the authorization is terminated or revoked.  Performed at [facility], [place] [address], [place], [place] [address]     Radiology Reports US RENAL  Result Date: 03/09/2021 CLINICAL DATA:  Urinary retention. EXAM: RENAL / URINARY TRACT ULTRASOUND COMPLETE COMPARISON:  CT 02/23/2019 FINDINGS: Right Kidney: Renal measurements: 12.7 x 5.7 x 5.4 cm = volume: 204 mL. Borderline increased parenchymal echogenicity. Previous parapelvic cyst on CT is not well seen by ultrasound. No hydronephrosis. No visualized calculi or focal lesion. Left Kidney: Renal measurements: 12.3 x 6.2 x 5.3 cm = volume: 211 mL. Technically challenging assessment due to limited acoustic window and rib shadowing. Mild increased parenchymal echogenicity. No hydronephrosis. Previous parapelvic cysts are not visualized. More detailed assessment is limited. Bladder: Foley catheter within the urinary bladder. Other: None. IMPRESSION: 1. Foley catheter decompressing the urinary bladder. 2. No hydronephrosis. 3. Mild bilateral increased renal parenchymal echogenicity. Limited detailed left renal assessment due to limited acoustic window. 4. Peripelvic cysts on prior CT are not seen. Electronically Signed   By: Keith Rake M.D.   On: 03/09/2021 17:38   DG Chest Port 1 View  Result Date: 03/09/2021 CLINICAL DATA:  Weakness EXAM: PORTABLE CHEST 1 VIEW COMPARISON:  Chest radiograph September 25, 2016 and chest CT February 23, 2019 FINDINGS: Apparent cardiac enlargement, likely accentuated by portable technique. No lobar consolidation. No significant pleural effusion or visible pneumothorax. Partially visualized anterior spinal fixation hardware. IMPRESSION: No active disease. Electronically Signed   By: Dahlia Bailiff MD   On: 03/09/2021 13:41    Lab Data:  CBC: Recent Labs  Lab 03/09/21 1327 03/10/21 1043 03/11/21 0410  WBC 27.1* 16.7* 13.5*  NEUTROABS 25.4*  --   --   HGB 12.0* 11.4* 10.2*  HCT 35.0* 33.9* 30.1*  MCV 89.1 91.6 91.2  PLT 207 163 094   Basic Metabolic Panel: Recent Labs  Lab 03/09/21 1327  03/10/21 1043 03/11/21 0410  NA 127* 130* 131*  K 3.6 3.4* 3.5  CL 94* 95* 95*  CO2 _0 GLUCOSE 114* 111* 101*  BUN _1 CREATININE 1.27* 0.99 0.99  CALCIUM 8.8* 8.8* 8.4*   GFR: Estimated Creatinine Clearance: 70.6 mL/min (by C-G formula based on SCr of 0.99 mg/dL). Liver Function Tests: Recent Labs  Lab 03/09/21 1327  AST 18  ALT <5  ALKPHOS 58  BILITOT 1.6*  PROT 7.0  ALBUMIN 3.9   No results for input(s): LIPASE, AMYLASE in the last 168 hours. No results for input(s): AMMONIA in the last 168 hours. Coagulation Profile: Recent Labs  Lab 03/09/21 1327  INR 1.2   Cardiac Enzymes: No results for input(s): CKTOTAL, CKMB, CKMBINDEX, TROPONINI in the last 168 hours. BNP (last 3 results) No results for input(s): PROBNP in the last 8760 hours. HbA1C: No results for input(s): HGBA1C in the last 72 hours. CBG: No results for input(s): GLUCAP in the last 168 hours. Lipid Profile: No results for input(s): CHOL, HDL, LDLCALC, TRIG, CHOLHDL, LDLDIRECT in the last 72 hours. Thyroid Function Tests: No results for input(s): TSH, T4TOTAL, FREET4, T3FREE, THYROIDAB in the last 72 hours. Anemia Panel: No results for input(s): VITAMINB12, FOLATE, FERRITIN, TIBC, IRON, RETICCTPCT in the last 72 hours. Urine analysis:  Component Value Date/Time   COLORURINE YELLOW 03/09/2021 1406   APPEARANCEUR CLEAR 03/09/2021 1406   LABSPEC 1.009 03/09/2021 1406   PHURINE 5.0 03/09/2021 1406   GLUCOSEU NEGATIVE 03/09/2021 1406   HGBUR SMALL (A) 03/09/2021 1406   BILIRUBINUR NEGATIVE 03/09/2021 1406   KETONESUR 5 (A) 03/09/2021 1406   PROTEINUR 30 (A) 03/09/2021 1406   NITRITE NEGATIVE 03/09/2021 1406   LEUKOCYTESUR TRACE (A) 03/09/2021 1406     Kahli Mayon M.D. [facility] 03/11/2021, 3:24 PM  Available via Epic secure chat 7am-7pm After 7 pm, please refer to night coverage provider listed on amion.

## 2021-03-11 NOTE — Progress Notes (Signed)
PHARMACY - PHYSICIAN COMMUNICATION CRITICAL VALUE ALERT - BLOOD CULTURE IDENTIFICATION (BCID)  Jacob Bolton is an 79 y.o. male who presented to [facility] on [facility]/09/2021 with a chief complaint of generalized weakness and fever  Assessment:  1/4 GNR, klebsiella oxytoca, no resistance detected  Name of physician (or Provider) Contacted: [place]  Current antibiotics: CTX 1gmIV q24h  Changes to prescribed antibiotics recommended:  CTX 2gm IV q24h  Results for orders placed or performed during the hospital encounter of 03/09/21  Blood Culture ID Panel (Reflexed) (Collected: 03/09/2021  1:30 PM)  Result Value Ref Range   Enterococcus faecalis NOT DETECTED NOT DETECTED   Enterococcus Faecium NOT DETECTED NOT DETECTED   Listeria monocytogenes NOT DETECTED NOT DETECTED   Staphylococcus species NOT DETECTED NOT DETECTED   Staphylococcus aureus (BCID) NOT DETECTED NOT DETECTED   Staphylococcus epidermidis NOT DETECTED NOT DETECTED   Staphylococcus lugdunensis NOT DETECTED NOT DETECTED   Streptococcus species NOT DETECTED NOT DETECTED   Streptococcus agalactiae NOT DETECTED NOT DETECTED   Streptococcus pneumoniae NOT DETECTED NOT DETECTED   Streptococcus pyogenes NOT DETECTED NOT DETECTED   A.calcoaceticus-baumannii NOT DETECTED NOT DETECTED   Bacteroides fragilis NOT DETECTED NOT DETECTED   Enterobacterales DETECTED (A) NOT DETECTED   Enterobacter cloacae complex NOT DETECTED NOT DETECTED   Escherichia coli NOT DETECTED NOT DETECTED   Klebsiella aerogenes NOT DETECTED NOT DETECTED   Klebsiella oxytoca DETECTED (A) NOT DETECTED   Klebsiella pneumoniae NOT DETECTED NOT DETECTED   Proteus species NOT DETECTED NOT DETECTED   Salmonella species NOT DETECTED NOT DETECTED   Serratia marcescens NOT DETECTED NOT DETECTED   Haemophilus influenzae NOT DETECTED NOT DETECTED   Neisseria meningitidis NOT DETECTED NOT DETECTED   Pseudomonas aeruginosa NOT DETECTED NOT DETECTED   Stenotrophomonas  maltophilia NOT DETECTED NOT DETECTED   Candida albicans NOT DETECTED NOT DETECTED   Candida auris NOT DETECTED NOT DETECTED   Candida glabrata NOT DETECTED NOT DETECTED   Candida krusei NOT DETECTED NOT DETECTED   Candida parapsilosis NOT DETECTED NOT DETECTED   Candida tropicalis NOT DETECTED NOT DETECTED   Cryptococcus neoformans/gattii NOT DETECTED NOT DETECTED   CTX-M ESBL NOT DETECTED NOT DETECTED   Carbapenem resistance IMP NOT DETECTED NOT DETECTED   Carbapenem resistance KPC NOT DETECTED NOT DETECTED   Carbapenem resistance NDM NOT DETECTED NOT DETECTED   Carbapenem resist OXA 48 LIKE NOT DETECTED NOT DETECTED   Carbapenem resistance VIM NOT DETECTED NOT DETECTED    Dolly Rias RPh 03/11/2021, 12:13 AM

## 2021-03-12 ENCOUNTER — Other Ambulatory Visit: Payer: Self-pay

## 2021-03-12 ENCOUNTER — Encounter (HOSPITAL_COMMUNITY): Payer: Self-pay | Admitting: Internal Medicine

## 2021-03-12 DIAGNOSIS — R7881 Bacteremia: Secondary | ICD-10-CM

## 2021-03-12 DIAGNOSIS — I10 Essential (primary) hypertension: Secondary | ICD-10-CM

## 2021-03-12 DIAGNOSIS — N390 Urinary tract infection, site not specified: Secondary | ICD-10-CM

## 2021-03-12 DIAGNOSIS — K5900 Constipation, unspecified: Secondary | ICD-10-CM

## 2021-03-12 DIAGNOSIS — B961 Klebsiella pneumoniae [K. pneumoniae] as the cause of diseases classified elsewhere: Secondary | ICD-10-CM

## 2021-03-12 DIAGNOSIS — I809 Phlebitis and thrombophlebitis of unspecified site: Secondary | ICD-10-CM

## 2021-03-12 LAB — CULTURE, BLOOD (SINGLE): Special Requests: ADEQUATE

## 2021-03-12 LAB — CBC
HCT: 31.2 % — ABNORMAL LOW (ref 39.0–52.0)
Hemoglobin: 10.4 g/dL — ABNORMAL LOW (ref 13.0–17.0)
MCH: 30.5 pg (ref 26.0–34.0)
MCHC: 33.3 g/dL (ref 30.0–36.0)
MCV: 91.5 fL (ref 80.0–100.0)
Platelets: 165 10*3/uL (ref 150–400)
RBC: 3.41 MIL/uL — ABNORMAL LOW (ref 4.22–5.81)
RDW: 13.1 % (ref 11.5–15.5)
WBC: 9.7 10*3/uL (ref 4.0–10.5)
nRBC: 0 % (ref 0.0–0.2)

## 2021-03-12 LAB — BASIC METABOLIC PANEL
Anion gap: 13 (ref 5–15)
BUN: 17 mg/dL (ref 8–23)
CO2: 21 mmol/L — ABNORMAL LOW (ref 22–32)
Calcium: 8.5 mg/dL — ABNORMAL LOW (ref 8.9–10.3)
Chloride: 98 mmol/L (ref 98–111)
Creatinine, Ser: 0.86 mg/dL (ref 0.61–1.24)
GFR, Estimated: >60 mL/min (ref >60)
Glucose, Bld: 111 mg/dL — ABNORMAL HIGH (ref 70–99)
Potassium: 3.7 mmol/L (ref 3.5–5.1)
Sodium: 132 mmol/L — ABNORMAL LOW (ref 135–145)

## 2021-03-12 LAB — URINE CULTURE: Culture: >=100000 — AB

## 2021-03-12 MED ORDER — AMOXICILLIN-POT CLAVULANATE 875-125 MG PO TABS
1.0000 | ORAL_TABLET | Freq: Two times a day (BID) | ORAL | Status: DC
  Administered 2021-03-12: 1 via ORAL
  Filled 2021-03-12: qty 1

## 2021-03-12 MED ORDER — AMOXICILLIN-POT CLAVULANATE 875-125 MG PO TABS: 1.0000 | ORAL_TABLET | Freq: Two times a day (BID) | ORAL | 0 refills | Status: AC

## 2021-03-12 MED ORDER — POLYETHYLENE GLYCOL 3350 17 G PO PACK: 17.0000 g | PACK | Freq: Every day | ORAL | 0 refills | Status: DC | PRN

## 2021-03-12 NOTE — Consult Note (Addendum)
[facility] for [facility]ious Disease  Total days of antibiotics 3/ceftriaxone 2gm               Reason for Consult: kleb bacteremia    Referring Physician: Tana Coast  Principal Problem:   Sepsis ([place]) Active Problems:   Parkinson disease ([place])   Memory disorder   HTN (hypertension)   Acute lower UTI   Acute urinary retention   AKI (acute kidney injury) ([place])   HPI: Jacob Bolton is a 80 y.o. male with parkinson's disease, admitted 3 days ago with acute urinary retention, leukocytosis of 23K, found to have UTI thought to be related to recent foley that he has needed for the last month, reinsertion needed due to retention. Found to also have kleb oxytoca bacteremia. Improved with abtx, leukocytosis resolved. He reports he is starting to feel better, ambulated around the ward. He does report some constipation but is passing gas. He denies any fever/chills/dysuria. He does have a urologist already established to see him.  Past Medical History:  Diagnosis Date  . High blood pressure   . Memory disorder 09/30/2019  . Parkinson disease ([place]) 12/03/2016    Allergies:  Allergies  Allergen Reactions  . Donepezil Other (See Comments)    nightmares  . Sulfamethoxazole     Other reaction(s): Other (See Comments) Childhood allergy     MEDICATIONS: . carbidopa-levodopa  1 tablet Oral TID  . Chlorhexidine Gluconate Cloth  6 each Topical Daily  . donepezil  5 mg Oral QPC breakfast  . enoxaparin (LOVENOX) injection  40 mg Subcutaneous Q24H  . feeding supplement  237 mL Oral TID BM  . multivitamin with minerals  1 tablet Oral Daily  . tamsulosin  0.4 mg Oral Daily    Social History   Tobacco Use  . Smoking status: Former Research scientist (life sciences)  . Smokeless tobacco: Never Used  Substance Use Topics  . Alcohol use: Yes    Alcohol/week: 3.0 standard drinks    Types: 3 Glasses of wine per week    Comment: WIne 2-3 nights per week  . Drug use: No    Family History  Problem Relation Age of Onset   . High blood pressure Mother   . Anxiety disorder Mother   . High blood pressure Father   . Heart attack Father      Review of Systems  Constitutional: Negative for fever, chills, diaphoresis, activity change, appetite change, fatigue and unexpected weight change.  HENT: Negative for congestion, sore throat, rhinorrhea, sneezing, trouble swallowing and sinus pressure.  Eyes: Negative for photophobia and visual disturbance.  Respiratory: Negative for cough, chest tightness, shortness of breath, wheezing and stridor.  Cardiovascular: Negative for chest pain, palpitations and leg swelling.  Gastrointestinal: +constipation.Negative for nausea, vomiting, abdominal pain, diarrhea, constipation, blood in stool, abdominal distention and anal bleeding.  Genitourinary: Negative for dysuria, hematuria, flank pain and difficulty urinating.  Musculoskeletal: Negative for myalgias, back pain, joint swelling, arthralgias and gait problem.  Skin: Negative for color change, pallor, rash and wound.  Neurological: Negative for dizziness, tremors, weakness and light-headedness.  Hematological: Negative for adenopathy. Does not bruise/bleed easily.  Psychiatric/Behavioral: +forgetfullness. Negative for behavioral problems, confusion, sleep disturbance, dysphoric mood, decreased concentration and agitation.     OBJECTIVE: Temp:  [98.1 F (36.7 C)-98.8 F (37.1 C)] 98.1 F (36.7 C) (03/14 0543) Pulse Rate:  [75-81] 81 (03/14 0543) Resp:  [17-20] 20 (03/14 0543) BP: (130-165)/(71-93) 165/93 (03/14 0543) SpO2:  [93 %-99 %] 93 % (03/14 0543)  Physical Exam  Constitutional: He is oriented to person, place, and time. He appears well-developed and well-nourished. No distress.  HENT:  Mouth/Throat: Oropharynx is clear and moist. No oropharyngeal exudate.  Cardiovascular: Normal rate, regular rhythm and normal heart sounds. Exam reveals no gallop and no friction rub.  No murmur heard.  Pulmonary/Chest:  Effort normal and breath sounds normal. No respiratory distress. He has no wheezes.  Abdominal: Soft. Bowel sounds are normal. He exhibits no distension. There is no tenderness.  Lymphadenopathy:  He has no cervical adenopathy.  Neurological: He is alert and oriented to person, place, and time.  Skin: Skin is warm and dry. Mild erythema about left ac PIV Psychiatric: He has a normal mood and affect. His behavior is normal.     LABS: Results for orders placed or performed during the hospital encounter of 03/09/21 (from the past 48 hour(s))  CBC     Status: Abnormal   Collection Time: 03/11/21  4:10 AM  Result Value Ref Range   WBC 13.5 (H) 4.0 - 10.5 K/uL   RBC 3.30 (L) 4.22 - 5.81 MIL/uL   Hemoglobin 10.2 (L) 13.0 - 17.0 g/dL   HCT 30.1 (L) 39.0 - 52.0 %   MCV 91.2 80.0 - 100.0 fL   MCH 30.9 26.0 - 34.0 pg   MCHC 33.9 30.0 - 36.0 g/dL   RDW 13.2 11.5 - 15.5 %   Platelets 156 150 - 400 K/uL   nRBC 0.0 0.0 - 0.2 %    Comment: Performed at [facility], [place] [address], [place], [place] [address]  Basic metabolic panel     Status: Abnormal   Collection Time: 03/11/21  4:10 AM  Result Value Ref Range   Sodium 131 (L) 135 - 145 mmol/L   Potassium 3.5 3.5 - 5.1 mmol/L   Chloride 95 (L) 98 - 111 mmol/L   CO2 26 22 - 32 mmol/L   Glucose, Bld 101 (H) 70 - 99 mg/dL    Comment: Glucose reference range applies only to samples taken after fasting for at least 8 hours.   BUN 21 8 - 23 mg/dL   Creatinine, Ser 0.99 0.61 - 1.24 mg/dL   Calcium 8.4 (L) 8.9 - 10.3 mg/dL   GFR, Estimated >60 >60 mL/min    Comment: (NOTE) Calculated using the CKD-EPI Creatinine Equation (2021)    Anion gap 10 5 - 15    Comment: Performed at [facility], [place] [address], [place], [place] [address]  CBC     Status: Abnormal   Collection Time: 03/12/21  3:15 AM  Result Value Ref Range   WBC 9.7 4.0 - 10.5 K/uL   RBC 3.41 (L) 4.22 - 5.81 MIL/uL   Hemoglobin 10.4 (L) 13.0 -  17.0 g/dL   HCT 31.2 (L) 39.0 - 52.0 %   MCV 91.5 80.0 - 100.0 fL   MCH 30.5 26.0 - 34.0 pg   MCHC 33.3 30.0 - 36.0 g/dL   RDW 13.1 11.5 - 15.5 %   Platelets 165 150 - 400 K/uL   nRBC 0.0 0.0 - 0.2 %    Comment: Performed at [facility], [place] [address], [place], [place] [address]  Basic metabolic panel     Status: Abnormal   Collection Time: 03/12/21  3:15 AM  Result Value Ref Range   Sodium 132 (L) 135 - 145 mmol/L   Potassium 3.7 3.5 - 5.1 mmol/L   Chloride 98 98 - 111 mmol/L  CO2 21 (L) 22 - 32 mmol/L   Glucose, Bld 111 (H) 70 - 99 mg/dL    Comment: Glucose reference range applies only to samples taken after fasting for at least 8 hours.   BUN 17 8 - 23 mg/dL   Creatinine, Ser 0.86 0.61 - 1.24 mg/dL   Calcium 8.5 (L) 8.9 - 10.3 mg/dL   GFR, Estimated >60 >60 mL/min    Comment: (NOTE) Calculated using the CKD-EPI Creatinine Equation (2021)    Anion gap 13 5 - 15    Comment: Performed at [facility], [place] [address], [place], [place] [address]    MICRO: Klebsiella oxytoca      MIC    AMPICILLIN RESISTANT  Resistant    AMPICILLIN/SULBACTAM 8 SENSITIVE  Sensitive    CEFAZOLIN 8 SENSITIVE  Sensitive    CEFEPIME <=0.12 SENS... Sensitive    CEFTRIAXONE <=0.25 SENS... Sensitive    CIPROFLOXACIN <=0.25 SENS... Sensitive    GENTAMICIN <=1 SENSITIVE  Sensitive    IMIPENEM <=0.25 SENS... Sensitive    NITROFURANTOIN <=16 SENSIT... Sensitive    PIP/TAZO <=4 SENSITIVE  Sensitive    TRIMETH/SULFA <=20 SENSIT... Sensitive     IMAGING: No results found.   Assessment/Plan:  80yo M with kleb oxytoca uti and secondary bacteremia, completed 3 days of iv abtx - recommend to finish out a total of 7 days of tx, can finish course with amox/clav  Constipation= recommend to use bisacodyl or miralax  Erythema about PIV site? Early phlebitis? = removed piv, warm pack to affected area  Will sign off .

## 2021-03-12 NOTE — Discharge Summary (Signed)
Physician Discharge Summary   Patient ID: Jacob Bolton MRN: [id number] DOB/AGE: 1941/07/04 80 y.o.  Admit date: 03/09/2021 Discharge date: 03/12/2021  Primary Care Physician:  Hulan Fess, MD   Recommendations for Outpatient Follow-up:  1. Follow up with PCP in 1-2 weeks 2. Augmentin 875-125 mg 1 tab p.o. twice daily for 4 more days to complete full course of 7 days  Home Health: Home health PT Equipment/Devices:   Discharge Condition: stable CODE STATUS: DNR Diet recommendation: Heart healthy diet   Discharge Diagnoses:    . Sepsis ([place]) without septic shock, present on admission Klebsiella UTI Klebsiella bacteremia Acute urinary retention, recurrent in the setting of BPH, UTI . AKI (acute kidney injury) ([place]) . HTN (hypertension) . Parkinson's disease with memory disorder     Consults: Infectious disease, Dr. Graylon Good    Allergies:   Allergies  Allergen Reactions  . Donepezil Other (See Comments)    nightmares  . Sulfamethoxazole     Other reaction(s): Other (See Comments) Childhood allergy     DISCHARGE MEDICATIONS: Allergies as of 03/12/2021      Reactions   Donepezil Other (See Comments)   nightmares   Sulfamethoxazole    Other reaction(s): Other (See Comments) Childhood allergy      Medication List    TAKE these medications   acetaminophen 500 MG tablet Commonly known as: TYLENOL Take 500 mg by mouth every 6 (six) hours as needed for moderate pain.   amoxicillin-clavulanate 875-125 MG tablet Commonly known as: AUGMENTIN Take 1 tablet by mouth 2 (two) times daily for 4 days.   Azelastine HCl 0.15 % Soln Place 1 spray into the nose in the morning and at bedtime.   carbidopa-levodopa 25-250 MG tablet Commonly known as: Sinemet Take 1 tablet by mouth 3 (three) times daily.   Cholecalciferol 25 MCG (1000 UT) tablet Take 1,000 Units by mouth daily.   donepezil 5 MG tablet Commonly known as: ARICEPT Take 1 tablet (5 mg total) by mouth  daily after breakfast. What changed: when to take this   fluticasone 50 MCG/ACT nasal spray Commonly known as: FLONASE Place 1 spray into both nostrils in the morning and at bedtime.   losartan-hydrochlorothiazide 100-12.5 MG tablet Commonly known as: HYZAAR Take 1 tablet by mouth daily.   meloxicam 15 MG tablet Commonly known as: MOBIC TAKE 1 TABLET BY MOUTH EVERY DAY WITH FOOD   Multivitamin Adults Tabs Take 1 tablet by mouth daily.   polyethylene glycol 17 g packet Commonly known as: MIRALAX / GLYCOLAX Take 17 g by mouth daily as needed for mild constipation or moderate constipation. Also available over-the-counter   pramipexole 1 MG tablet Commonly known as: MIRAPEX TAKE 1 TABLET BY MOUTH THREE TIMES A DAY   silodosin 8 MG Caps capsule Commonly known as: RAPAFLO Take 8 mg by mouth every evening.        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 Patient had the foleycatheter removed before admissionand has haddecreased urinary output withsome dribbling since that time.He had a follow up urology appointment on admission but was too weak at home to go to the appointment, so he was brought to the ED. also complained of mild abdominal discomfort. Bladder scan showed  700 mL and Foley catheter was placed.  Creatinine 1.27 WBCs 27 Sodium 127, potassium 3.6.  UA positive for UTI  Hospital Course:   Sepsis ([place]) without septic shock, present on admission, likely secondary to UTI, Klebsiella bacteremia -Patient met sepsis criteria with fever, tachycardia, tachypnea, leukocytosis, UA positive for UTI -Renal ultrasound showed no hydronephrosis or obstruction -Patient was placed  on IV Rocephin, increase to 2 g IV daily Blood cultures and urine cultures positive for Klebsiella -Patient was seen by infectious disease, Dr. Graylon Good, recommended oral Augmentin for 4 more days to complete full course of 7 days      Acute urinary retention, recurrent, in the setting of BPH, UTI -Occurred postop 1 month ago and had his Foley removed before admission.  Bladder scan showed 700 mL.  Foley reinserted -Continue Foley, outpatient urology follow-up (Dr [facility]) for voiding trial in 1 week -Continue Flomax    AKI (acute kidney injury) ([place]) -Likely due to #1, losartan, HCTZ, meloxicam were held during hospitalization -Resolved, 0.8     Parkinson disease ([place]) with memory deficit -Currently fairly alert and oriented, at his baseline.   -Continue Sinemet, Aricept     HTN (hypertension) BP now stable, AKI has resolved, resume lisinopril/HCTZ   Generalized debility PT evaluation recommended home health   Day of Discharge S: No acute complaints, no fevers or chills, wants to go home  BP (!) 165/93 (BP Location: Left Arm)   Pulse 81   Temp 98.1 F (36.7 C) (Oral)   Resp 20   Ht $R'5\' 11"'aS$  (1.803 m)   Wt 93.4 kg   SpO2 93%   BMI 28.73 kg/m   Physical Exam: General: Alert and awake oriented x3 not in any acute distress. HEENT: anicteric sclera, pupils reactive to light and accommodation CVS: S1-S2 clear no murmur rubs or gallops Chest: clear to auscultation bilaterally, no wheezing rales or rhonchi Abdomen: soft nontender, nondistended, normal bowel sounds Extremities: no cyanosis, clubbing or edema noted bilaterally Neuro: no acute deficits    Get Medicines reviewed and adjusted: Please take all your medications with you for your next visit with your Primary MD  Please request your Primary MD to go over all hospital tests and procedure/radiological results at the follow up. Please ask your Primary MD to get all Hospital records sent to  his/her office.  If you experience worsening of your admission symptoms, develop shortness of breath, life threatening emergency, suicidal or homicidal thoughts you must seek medical attention immediately by calling 911 or calling your MD immediately  if symptoms less severe.  You must read complete instructions/literature along with all the possible adverse reactions/side effects for all the Medicines you take and that have been prescribed to you. Take any new Medicines after you have completely understood and accept all the possible adverse reactions/side effects.   Do not drive when taking pain medications.   Do not take more than prescribed Pain, Sleep and Anxiety Medications  Special Instructions: If you have smoked or chewed Tobacco  in the last 2 yrs please stop smoking, stop any regular Alcohol  and or any Recreational drug use.  Wear Seat belts while driving.  Please note  You were cared for by a hospitalist during your hospital stay. Once you are discharged, your primary care physician will handle any further medical issues. Please note that NO REFILLS for any discharge medications will be authorized once you are discharged, as it is imperative that you return to your primary care  physician (or establish a relationship with a primary care physician if you do not have one) for your aftercare needs so that they can reassess your need for medications and monitor your lab values.   The results of significant diagnostics from this hospitalization (including imaging, microbiology, ancillary and laboratory) are listed below for reference.      Procedures/Studies:  US RENAL  Result Date: 03-27-21 CLINICAL DATA:  Urinary retention. EXAM: RENAL / URINARY TRACT ULTRASOUND COMPLETE COMPARISON:  CT 02/23/2019 FINDINGS: Right Kidney: Renal measurements: 12.7 x 5.7 x 5.4 cm = volume: 204 mL. Borderline increased parenchymal echogenicity. Previous parapelvic cyst on CT is not well seen by  ultrasound. No hydronephrosis. No visualized calculi or focal lesion. Left Kidney: Renal measurements: 12.3 x 6.2 x 5.3 cm = volume: 211 mL. Technically challenging assessment due to limited acoustic window and rib shadowing. Mild increased parenchymal echogenicity. No hydronephrosis. Previous parapelvic cysts are not visualized. More detailed assessment is limited. Bladder: Foley catheter within the urinary bladder. Other: None. IMPRESSION: 1. Foley catheter decompressing the urinary bladder. 2. No hydronephrosis. 3. Mild bilateral increased renal parenchymal echogenicity. Limited detailed left renal assessment due to limited acoustic window. 4. Peripelvic cysts on prior CT are not seen. Electronically Signed   By: Keith Rake M.D.   On: Mar 27, 2021 17:38   DG Chest Port 1 View  Result Date: 03-27-21 CLINICAL DATA:  Weakness EXAM: PORTABLE CHEST 1 VIEW COMPARISON:  Chest radiograph September 25, 2016 and chest CT February 23, 2019 FINDINGS: Apparent cardiac enlargement, likely accentuated by portable technique. No lobar consolidation. No significant pleural effusion or visible pneumothorax. Partially visualized anterior spinal fixation hardware. IMPRESSION: No active disease. Electronically Signed   By: Dahlia Bailiff MD   On: 2021-03-27 13:41      LAB RESULTS: Basic Metabolic Panel: Recent Labs  Lab 03/11/21 0410 03/12/21 0315  NA 131* 132*  K 3.5 3.7  CL 95* 98  CO2 26 21*  GLUCOSE 101* 111*  BUN 21 17  CREATININE 0.99 0.86  CALCIUM 8.4* 8.5*   Liver Function Tests: Recent Labs  Lab 03-27-2021 1327  AST 18  ALT <5  ALKPHOS 58  BILITOT 1.6*  PROT 7.0  ALBUMIN 3.9   No results for input(s): LIPASE, AMYLASE in the last 168 hours. No results for input(s): AMMONIA in the last 168 hours. CBC: Recent Labs  Lab 2021-03-27 1327 03/10/21 1043 03/11/21 0410 03/12/21 0315  WBC 27.1*   < > 13.5* 9.7  NEUTROABS 25.4*  --   --   --   HGB 12.0*   < > 10.2* 10.4*  HCT 35.0*   < >  30.1* 31.2*  MCV 89.1   < > 91.2 91.5  PLT 207   < > 156 165   < > = values in this interval not displayed.   Cardiac Enzymes: No results for input(s): CKTOTAL, CKMB, CKMBINDEX, TROPONINI in the last 168 hours. BNP: Invalid input(s): POCBNP CBG: No results for input(s): GLUCAP in the last 168 hours.     Disposition and Follow-up: Discharge Instructions    Diet - low sodium heart healthy   Complete by: As directed    Increase activity slowly   Complete by: As directed        DISPOSITION: [place], Christopher Aaron, MD. Schedule an appointment as soon as possible for a visit in 1 week(s).   Specialty: Urology Why: Please make appointment in  1 week for voiding trial Contact information: [address] [place] [address] [phone]        Hulan Fess, MD Follow up in 2 week(s).   Specialty: Family Medicine Contact information: [place] [address] [phone]                Time coordinating discharge:  35 minutes  Signed:   Estill Cotta M.D. [facility] 03/12/2021, 1:50 PM

## 2021-03-12 NOTE — Progress Notes (Signed)
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 He walked the entire unit with use of a RW. Mostly Min guard assist. Daughter was present during session. She will be helping care for him at home. He is not quite yet back to his baseline so we will recommend HHPT f/u.     Follow Up Recommendations  Home health PT     Equipment Recommendations  None recommended by PT    Recommendations for Other Services       Precautions / Restrictions Precautions Precautions: Fall Restrictions Weight Bearing Restrictions: No    Mobility  Bed Mobility Overal bed mobility: Needs Assistance Bed Mobility: Supine to Sit     Supine to sit: Supervision;HOB elevated     General bed mobility comments: Pt relied on bedrail. No physical assistance provided by therapist. Cues for safety, technique.    Transfers Overall transfer level: Needs assistance Equipment used: Rolling walker (2 wheeled) Transfers: Sit to/from Stand Sit to Stand: From elevated surface;Min guard;Min assist         General transfer comment: Increased time and multiple attempts but pt was able to stand with little to no assist. Cues for safety, technique, hand placement.  Ambulation/Gait Ambulation/Gait assistance: Min guard Gait Distance (Feet): 500 Feet Assistive device: Rolling walker (2 wheeled) Gait Pattern/deviations: Step-through pattern;Decreased stride length     General Gait Details: Min guard for safety. No LOB with RW use. Pt was able to walk the entire unit.   Stairs             Wheelchair  Mobility    Modified Rankin (Stroke Patients Only)       Balance Overall balance assessment: Needs assistance         Standing balance support: Bilateral upper extremity supported Standing balance-Leahy Scale: Fair                              Cognition Arousal/Alertness: Awake/alert Behavior During Therapy: WFL for tasks assessed/performed Overall Cognitive Status: Within Functional Limits for tasks assessed                                        Exercises      General Comments        Pertinent Vitals/Pain Pain Assessment: No/denies pain    Home Living                      Prior Function            PT Goals (current goals can now be found in the care plan section) Progress towards PT goals: Progressing toward goals    Frequency    Min 3X/week      PT Plan Current plan remains appropriate    Co-evaluation              AM-PAC PT "6 Clicks" Mobility   Outcome Measure  Help needed turning from your back to your side while in a  flat bed without using bedrails?: A Little Help needed moving from lying on your back to sitting on the side of a flat bed without using bedrails?: A Little Help needed moving to and from a bed to a chair (including a wheelchair)?: A Little Help needed standing up from a chair using your arms (e.g., wheelchair or bedside chair)?: A Little Help needed to walk in hospital room?: A Little Help needed climbing 3-5 steps with a railing? : A Little 6 Click Score: 18    End of Session Equipment Utilized During Treatment: Gait belt Activity Tolerance: Patient tolerated treatment well Patient left: in chair;with call bell/phone within reach;with family/visitor present   PT Visit Diagnosis: Muscle weakness (generalized) (M62.81);Difficulty in walking, not elsewhere classified (R26.2)     Time: 3354-5625 PT Time Calculation (min) (ACUTE ONLY): 17 min  Charges:  $Gait Training: 8-22  mins                         Doreatha Massed, PT Acute Rehabilitation  Office: [phone] Pager: [phone]

## 2021-03-12 NOTE — TOC Transition Note (Signed)
Transition of Care [facility]) - CM/SW Discharge Note   Patient Details  Name: Jacob Bolton MRN: [id number] Date of Birth: 1941-12-11  [facility] CM/SW Contact:  Ross Ludwig, LCSW Phone Number: 03/12/2021, 3:27 PM   Clinical Narrative:    CSW spoke to patient's wife, to discuss home health agencies.  Patient's wife did not have a preference, CS[facility] and [place] [place] they were not able to accept patient.  CSW contacted [facility], they are able to accept patient for [facility].  CSW notified patient's wife, and made her aware of home health agency.  Patient will be going [facility] with [facility].  CSW signing off please reconsult with any other social work needs, home health agency has been notified of planned discharge.   Final next level of care: [place] Barriers to Discharge: Barriers Resolved   Patient Goals and CMS Choice Patient states their goals for this hospitalization and ongoing recovery are:: To return back home with home health. CMS Medicare.gov Compare Post Acute Care list provided to:: Patient Represenative (must comment) Choice offered to / list presented to : Spouse  Discharge Placement                       Discharge Plan and Services                          HH Arranged: PT [facility] Agency: [facility] Date [facility] Agency Contacted: 03/12/21 Time [place]: [id number] Representative spoke with at [place]: [place] ([place]) Interventions     Readmission Risk Interventions No flowsheet data found.

## 2021-03-12 NOTE — Care Management Important Message (Signed)
Important Message  Patient Details IM Letter given to the Patient. Name: Jacob Bolton MRN: [id number] Date of Birth: 08-Dec-1941   Medicare Important Message Given:  Yes     Kerin Salen 03/12/2021, 12:18 PM

## 2021-03-12 NOTE — Plan of Care (Signed)
  Problem: Education: Goal: Knowledge of General Education information will improve Description: Including pain rating scale, medication(s)/side effects and non-pharmacologic comfort measures Outcome: Adequate for Discharge   

## 2021-03-13 DIAGNOSIS — I10 Essential (primary) hypertension: Secondary | ICD-10-CM | POA: Diagnosis not present

## 2021-03-13 DIAGNOSIS — N400 Enlarged prostate without lower urinary tract symptoms: Secondary | ICD-10-CM | POA: Diagnosis not present

## 2021-03-13 DIAGNOSIS — G20 Parkinson's disease: Secondary | ICD-10-CM | POA: Diagnosis not present

## 2021-03-14 LAB — CULTURE, BLOOD (SINGLE)
Culture: NO GROWTH
Special Requests: ADEQUATE

## 2021-03-15 ENCOUNTER — Other Ambulatory Visit: Payer: Self-pay | Admitting: Neurology

## 2021-03-16 DIAGNOSIS — Z09 Encounter for follow-up examination after completed treatment for conditions other than malignant neoplasm: Secondary | ICD-10-CM | POA: Diagnosis not present

## 2021-03-16 DIAGNOSIS — D649 Anemia, unspecified: Secondary | ICD-10-CM | POA: Diagnosis not present

## 2021-03-16 DIAGNOSIS — E876 Hypokalemia: Secondary | ICD-10-CM | POA: Diagnosis not present

## 2021-03-16 DIAGNOSIS — R7303 Prediabetes: Secondary | ICD-10-CM | POA: Diagnosis not present

## 2021-03-16 DIAGNOSIS — G20 Parkinson's disease: Secondary | ICD-10-CM | POA: Diagnosis not present

## 2021-03-16 DIAGNOSIS — I10 Essential (primary) hypertension: Secondary | ICD-10-CM | POA: Diagnosis not present

## 2021-03-17 DIAGNOSIS — Z87891 Personal history of nicotine dependence: Secondary | ICD-10-CM | POA: Diagnosis not present

## 2021-03-17 DIAGNOSIS — Z87440 Personal history of urinary (tract) infections: Secondary | ICD-10-CM | POA: Diagnosis not present

## 2021-03-17 DIAGNOSIS — Z9181 History of falling: Secondary | ICD-10-CM | POA: Diagnosis not present

## 2021-03-17 DIAGNOSIS — Z981 Arthrodesis status: Secondary | ICD-10-CM | POA: Diagnosis not present

## 2021-03-17 DIAGNOSIS — G20 Parkinson's disease: Secondary | ICD-10-CM | POA: Diagnosis not present

## 2021-03-17 DIAGNOSIS — Z466 Encounter for fitting and adjustment of urinary device: Secondary | ICD-10-CM | POA: Diagnosis not present

## 2021-03-17 DIAGNOSIS — I10 Essential (primary) hypertension: Secondary | ICD-10-CM | POA: Diagnosis not present

## 2021-03-17 DIAGNOSIS — R338 Other retention of urine: Secondary | ICD-10-CM | POA: Diagnosis not present

## 2021-03-17 DIAGNOSIS — Z85831 Personal history of malignant neoplasm of soft tissue: Secondary | ICD-10-CM | POA: Diagnosis not present

## 2021-03-17 DIAGNOSIS — Z79899 Other long term (current) drug therapy: Secondary | ICD-10-CM | POA: Diagnosis not present

## 2021-03-17 DIAGNOSIS — N401 Enlarged prostate with lower urinary tract symptoms: Secondary | ICD-10-CM | POA: Diagnosis not present

## 2021-03-21 DIAGNOSIS — R338 Other retention of urine: Secondary | ICD-10-CM | POA: Diagnosis not present

## 2021-03-23 ENCOUNTER — Other Ambulatory Visit: Payer: Self-pay | Admitting: Neurology

## 2021-03-23 DIAGNOSIS — N401 Enlarged prostate with lower urinary tract symptoms: Secondary | ICD-10-CM | POA: Diagnosis not present

## 2021-03-23 DIAGNOSIS — R338 Other retention of urine: Secondary | ICD-10-CM | POA: Diagnosis not present

## 2021-03-23 DIAGNOSIS — R3914 Feeling of incomplete bladder emptying: Secondary | ICD-10-CM | POA: Diagnosis not present

## 2021-03-27 DIAGNOSIS — C4921 Malignant neoplasm of connective and soft tissue of right lower limb, including hip: Secondary | ICD-10-CM | POA: Diagnosis not present

## 2021-03-28 ENCOUNTER — Telehealth: Payer: Self-pay | Admitting: Neurology

## 2021-03-28 NOTE — Telephone Encounter (Signed)
Pt would like a call to discuss possible drug interactions with the medications he is taking from his various providers, please call to discuss.

## 2021-03-29 NOTE — Telephone Encounter (Signed)
I called the patient.  He was given a prescription for pain and for sleep, wants to know if it has any interactions with his medications.  He does not know the name of the medication, he is away from the house.  I will call him tomorrow morning to discuss the issue.

## 2021-03-30 DIAGNOSIS — G20 Parkinson's disease: Secondary | ICD-10-CM | POA: Diagnosis not present

## 2021-03-30 DIAGNOSIS — Z9181 History of falling: Secondary | ICD-10-CM | POA: Diagnosis not present

## 2021-03-30 DIAGNOSIS — Z85831 Personal history of malignant neoplasm of soft tissue: Secondary | ICD-10-CM | POA: Diagnosis not present

## 2021-03-30 DIAGNOSIS — Z466 Encounter for fitting and adjustment of urinary device: Secondary | ICD-10-CM | POA: Diagnosis not present

## 2021-03-30 DIAGNOSIS — I10 Essential (primary) hypertension: Secondary | ICD-10-CM | POA: Diagnosis not present

## 2021-03-30 DIAGNOSIS — Z87891 Personal history of nicotine dependence: Secondary | ICD-10-CM | POA: Diagnosis not present

## 2021-03-30 DIAGNOSIS — N401 Enlarged prostate with lower urinary tract symptoms: Secondary | ICD-10-CM | POA: Diagnosis not present

## 2021-03-30 DIAGNOSIS — Z981 Arthrodesis status: Secondary | ICD-10-CM | POA: Diagnosis not present

## 2021-03-30 DIAGNOSIS — R972 Elevated prostate specific antigen [PSA]: Secondary | ICD-10-CM | POA: Diagnosis not present

## 2021-03-30 DIAGNOSIS — Z79899 Other long term (current) drug therapy: Secondary | ICD-10-CM | POA: Diagnosis not present

## 2021-03-30 DIAGNOSIS — R338 Other retention of urine: Secondary | ICD-10-CM | POA: Diagnosis not present

## 2021-03-30 DIAGNOSIS — Z87440 Personal history of urinary (tract) infections: Secondary | ICD-10-CM | POA: Diagnosis not present

## 2021-03-30 NOTE — Telephone Encounter (Signed)
I called the patient.  The patient will be going on gabapentin taking up to 900 mg in the evening.  He has a 300 mg capsules.  I have no problem with him using this medication.  I would recommend starting out taking only 1 capsule at night rather than all 3.  He is to work up gradually.

## 2021-04-20 DIAGNOSIS — I10 Essential (primary) hypertension: Secondary | ICD-10-CM | POA: Diagnosis not present

## 2021-04-20 DIAGNOSIS — N400 Enlarged prostate without lower urinary tract symptoms: Secondary | ICD-10-CM | POA: Diagnosis not present

## 2021-04-20 DIAGNOSIS — G20 Parkinson's disease: Secondary | ICD-10-CM | POA: Diagnosis not present

## 2021-04-27 ENCOUNTER — Other Ambulatory Visit: Payer: Self-pay | Admitting: Neurology

## 2021-05-01 ENCOUNTER — Inpatient Hospital Stay (HOSPITAL_COMMUNITY)
Admission: EM | Admit: 2021-05-01 | Discharge: 2021-05-03 | DRG: 700 | Disposition: A | Discharge status: Home / Self Care | Payer: PPO | Attending: Internal Medicine | Admitting: Internal Medicine

## 2021-05-01 ENCOUNTER — Encounter (HOSPITAL_COMMUNITY): Payer: Self-pay

## 2021-05-01 ENCOUNTER — Emergency Department (HOSPITAL_COMMUNITY): Payer: PPO

## 2021-05-01 ENCOUNTER — Other Ambulatory Visit: Payer: Self-pay

## 2021-05-01 DIAGNOSIS — N390 Urinary tract infection, site not specified: Secondary | ICD-10-CM | POA: Diagnosis present

## 2021-05-01 DIAGNOSIS — Z79899 Other long term (current) drug therapy: Secondary | ICD-10-CM | POA: Diagnosis not present

## 2021-05-01 DIAGNOSIS — N401 Enlarged prostate with lower urinary tract symptoms: Secondary | ICD-10-CM | POA: Diagnosis present

## 2021-05-01 DIAGNOSIS — R338 Other retention of urine: Secondary | ICD-10-CM | POA: Diagnosis not present

## 2021-05-01 DIAGNOSIS — Y846 Urinary catheterization as the cause of abnormal reaction of the patient, or of later complication, without mention of misadventure at the time of the procedure: Secondary | ICD-10-CM | POA: Diagnosis present

## 2021-05-01 DIAGNOSIS — B9689 Other specified bacterial agents as the cause of diseases classified elsewhere: Secondary | ICD-10-CM | POA: Diagnosis not present

## 2021-05-01 DIAGNOSIS — I10 Essential (primary) hypertension: Secondary | ICD-10-CM | POA: Diagnosis present

## 2021-05-01 DIAGNOSIS — R531 Weakness: Secondary | ICD-10-CM | POA: Diagnosis not present

## 2021-05-01 DIAGNOSIS — Z87891 Personal history of nicotine dependence: Secondary | ICD-10-CM | POA: Diagnosis not present

## 2021-05-01 DIAGNOSIS — G20 Parkinson's disease: Secondary | ICD-10-CM | POA: Diagnosis not present

## 2021-05-01 DIAGNOSIS — Z20822 Contact with and (suspected) exposure to covid-19: Secondary | ICD-10-CM | POA: Diagnosis present

## 2021-05-01 DIAGNOSIS — T83511A Infection and inflammatory reaction due to indwelling urethral catheter, initial encounter: Principal | ICD-10-CM | POA: Diagnosis present

## 2021-05-01 DIAGNOSIS — N3001 Acute cystitis with hematuria: Secondary | ICD-10-CM | POA: Diagnosis not present

## 2021-05-01 DIAGNOSIS — R509 Fever, unspecified: Secondary | ICD-10-CM | POA: Diagnosis not present

## 2021-05-01 LAB — COMPREHENSIVE METABOLIC PANEL
ALT: 14 U/L (ref 0–44)
AST: 19 U/L (ref 15–41)
Albumin: 4.2 g/dL (ref 3.5–5.0)
Alkaline Phosphatase: 81 U/L (ref 38–126)
Anion gap: 10 (ref 5–15)
BUN: 19 mg/dL (ref 8–23)
CO2: 26 mmol/L (ref 22–32)
Calcium: 9.2 mg/dL (ref 8.9–10.3)
Chloride: 99 mmol/L (ref 98–111)
Creatinine, Ser: 1.18 mg/dL (ref 0.61–1.24)
GFR, Estimated: >60 mL/min (ref >60)
Glucose, Bld: 111 mg/dL — ABNORMAL HIGH (ref 70–99)
Potassium: 4 mmol/L (ref 3.5–5.1)
Sodium: 135 mmol/L (ref 135–145)
Total Bilirubin: 1.1 mg/dL (ref 0.3–1.2)
Total Protein: 8.1 g/dL (ref 6.5–8.1)

## 2021-05-01 LAB — CBC WITH DIFFERENTIAL/PLATELET
Abs Immature Granulocytes: 0.05 10*3/uL (ref 0.00–0.07)
Basophils Absolute: 0.1 10*3/uL (ref 0.0–0.1)
Basophils Relative: 1 %
Eosinophils Absolute: 0 10*3/uL (ref 0.0–0.5)
Eosinophils Relative: 0 %
HCT: 39.4 % (ref 39.0–52.0)
Hemoglobin: 13 g/dL (ref 13.0–17.0)
Immature Granulocytes: 0 %
Lymphocytes Relative: 4 %
Lymphs Abs: 0.5 10*3/uL — ABNORMAL LOW (ref 0.7–4.0)
MCH: 30.8 pg (ref 26.0–34.0)
MCHC: 33 g/dL (ref 30.0–36.0)
MCV: 93.4 fL (ref 80.0–100.0)
Monocytes Absolute: 0.8 10*3/uL (ref 0.1–1.0)
Monocytes Relative: 6 %
Neutro Abs: 11.5 10*3/uL — ABNORMAL HIGH (ref 1.7–7.7)
Neutrophils Relative %: 89 %
Platelets: 204 10*3/uL (ref 150–400)
RBC: 4.22 MIL/uL (ref 4.22–5.81)
RDW: 13.8 % (ref 11.5–15.5)
WBC: 12.9 10*3/uL — ABNORMAL HIGH (ref 4.0–10.5)
nRBC: 0 % (ref 0.0–0.2)

## 2021-05-01 LAB — URINALYSIS, ROUTINE W REFLEX MICROSCOPIC
Bilirubin Urine: NEGATIVE
Glucose, UA: NEGATIVE mg/dL
Ketones, ur: 5 mg/dL — AB
Nitrite: POSITIVE — AB
Protein, ur: 100 mg/dL — AB
Specific Gravity, Urine: 1.02 (ref 1.005–1.030)
WBC, UA: >50 WBC/hpf — ABNORMAL HIGH (ref 0–5)
pH: 6 (ref 5.0–8.0)

## 2021-05-01 LAB — RESP PANEL BY RT-PCR (FLU A&B, COVID) ARPGX2
Influenza A by PCR: NEGATIVE
Influenza B by PCR: NEGATIVE
SARS Coronavirus 2 by RT PCR: NEGATIVE

## 2021-05-01 LAB — APTT: aPTT: 30 seconds (ref 24–36)

## 2021-05-01 LAB — PROTIME-INR
INR: 1.1 (ref 0.8–1.2)
Prothrombin Time: 14.2 seconds (ref 11.4–15.2)

## 2021-05-01 LAB — LACTIC ACID, PLASMA
Lactic Acid, Venous: 1.4 mmol/L (ref 0.5–1.9)
Lactic Acid, Venous: 0.9 mmol/L (ref 0.5–1.9)

## 2021-05-01 MED ORDER — ACETAMINOPHEN 650 MG RE SUPP: 650.0000 mg | Freq: Four times a day (QID) | RECTAL | Status: DC | PRN

## 2021-05-01 MED ORDER — PRAMIPEXOLE DIHYDROCHLORIDE 1 MG PO TABS
1.0000 mg | ORAL_TABLET | Freq: Three times a day (TID) | ORAL | Status: DC
  Administered 2021-05-01 – 2021-05-03 (×5): 1 mg via ORAL
  Filled 2021-05-01 (×6): qty 1

## 2021-05-01 MED ORDER — ACETAMINOPHEN 325 MG PO TABS
650.0000 mg | ORAL_TABLET | Freq: Once | ORAL | Status: AC
  Administered 2021-05-01: 650 mg via ORAL
  Filled 2021-05-01: qty 2

## 2021-05-01 MED ORDER — FLUTICASONE PROPIONATE 50 MCG/ACT NA SUSP
1.0000 | NASAL | Status: DC
  Administered 2021-05-01 – 2021-05-03 (×4): 1 via NASAL
  Filled 2021-05-01: qty 16

## 2021-05-01 MED ORDER — TERAZOSIN HCL 5 MG PO CAPS
10.0000 mg | ORAL_CAPSULE | Freq: Every day | ORAL | Status: DC
  Administered 2021-05-01 – 2021-05-03 (×3): 10 mg via ORAL
  Filled 2021-05-01 (×3): qty 2

## 2021-05-01 MED ORDER — ENOXAPARIN SODIUM 40 MG/0.4ML IJ SOSY
40.0000 mg | PREFILLED_SYRINGE | INTRAMUSCULAR | Status: DC
  Administered 2021-05-01 – 2021-05-02 (×2): 40 mg via SUBCUTANEOUS
  Filled 2021-05-01 (×2): qty 0.4

## 2021-05-01 MED ORDER — AZELASTINE HCL 0.1 % NA SOLN
1.0000 | NASAL | Status: DC
  Administered 2021-05-01 – 2021-05-03 (×4): 1 via NASAL
  Filled 2021-05-01: qty 30

## 2021-05-01 MED ORDER — SODIUM CHLORIDE 0.9 % IV SOLN
Freq: Once | INTRAVENOUS | Status: AC
Start: 2021-05-01 — End: 2021-05-01

## 2021-05-01 MED ORDER — CARBIDOPA-LEVODOPA 25-250 MG PO TABS
1.0000 | ORAL_TABLET | Freq: Three times a day (TID) | ORAL | Status: DC
  Administered 2021-05-01 – 2021-05-03 (×5): 1 via ORAL
  Filled 2021-05-01 (×6): qty 1

## 2021-05-01 MED ORDER — SODIUM CHLORIDE 0.9 % IV SOLN: Freq: Once | INTRAVENOUS | Status: AC

## 2021-05-01 MED ORDER — SODIUM CHLORIDE 0.9 % IV SOLN
1.0000 g | INTRAVENOUS | Status: DC
  Administered 2021-05-02: 1 g via INTRAVENOUS
  Filled 2021-05-01 (×2): qty 10

## 2021-05-01 MED ORDER — LOSARTAN POTASSIUM 50 MG PO TABS
100.0000 mg | ORAL_TABLET | Freq: Every day | ORAL | Status: DC
  Administered 2021-05-01 – 2021-05-03 (×3): 100 mg via ORAL
  Filled 2021-05-01 (×3): qty 2

## 2021-05-01 MED ORDER — SODIUM CHLORIDE 0.9 % IV SOLN
2.0000 g | Freq: Once | INTRAVENOUS | Status: AC
  Administered 2021-05-01: 2 g via INTRAVENOUS
  Filled 2021-05-01: qty 20

## 2021-05-01 MED ORDER — LOSARTAN POTASSIUM-HCTZ 100-12.5 MG PO TABS: 1.0000 | ORAL_TABLET | Freq: Every day | ORAL | Status: DC

## 2021-05-01 MED ORDER — HYDROCHLOROTHIAZIDE 12.5 MG PO CAPS
12.5000 mg | ORAL_CAPSULE | Freq: Every day | ORAL | Status: DC
  Administered 2021-05-01 – 2021-05-03 (×3): 12.5 mg via ORAL
  Filled 2021-05-01 (×3): qty 1

## 2021-05-01 MED ORDER — ACETAMINOPHEN 325 MG PO TABS
650.0000 mg | ORAL_TABLET | Freq: Four times a day (QID) | ORAL | Status: DC | PRN
  Administered 2021-05-01: 650 mg via ORAL
  Filled 2021-05-01: qty 2

## 2021-05-01 MED ORDER — SODIUM CHLORIDE 0.9 % IV BOLUS
500.0000 mL | Freq: Once | INTRAVENOUS | Status: AC
  Administered 2021-05-01: 500 mL via INTRAVENOUS

## 2021-05-01 MED ORDER — VITAMIN D3 25 MCG (1000 UNIT) PO TABS
1000.0000 [IU] | ORAL_TABLET | Freq: Every day | ORAL | Status: DC
  Administered 2021-05-01 – 2021-05-03 (×3): 1000 [IU] via ORAL
  Filled 2021-05-01 (×3): qty 1

## 2021-05-01 NOTE — Plan of Care (Signed)
  Problem: Nutrition: Goal: Adequate nutrition will be maintained Outcome: Progressing   Problem: Coping: Goal: Level of anxiety will decrease Outcome: Progressing   Problem: Pain Managment: Goal: General experience of comfort will improve Outcome: Progressing   

## 2021-05-01 NOTE — ED Notes (Signed)
Chest xray done.

## 2021-05-01 NOTE — ED Notes (Signed)
Patient is ready for admission 

## 2021-05-01 NOTE — H&P (Signed)
History and Physical    Jacob Bolton VPX:[id number] DOB: [birth year] DOA: 05/01/2021  PCP: Hulan Fess, MD  Patient coming from: Home  Chief Complaint: Fevers, weakness  HPI: Jacob Bolton is a 80 y.o. male with medical history significant of parkinson's disease, chronic urinary retention w/ indwelling foley cath, HTN. Presenting with fever and weakness. Per his dtr's report, he has had recent issues with urinary retention and UTIs. He's been followed up with urology. He had a recent admission for sepsis secondary to UTI. He was doing ok at him until this morning. Hhe woke with a fever of 101.3. He was lethargic and weak. He was unable to get out of bed. So, his wife called for help. She was concerned that he was demonstrating the same symptoms that he did prior to his admission for sepsis. They did not try any medications or treatments for his symptoms. They deny any other aggravating or alleviating factors.   ED Course: He was febrile. UA was c/w UTI. He had an elevated white count. He was started on rocephin. [facility] was called for admission.    Review of Systems: Review of systems is otherwise negative for all not mentioned in HPI.   PMHx Past Medical History:  Diagnosis Date  . High blood pressure   . Memory disorder 09/30/2019  . Parkinson disease ([place]) 12/03/2016    PSHx Past Surgical History:  Procedure Laterality Date  . CERVICAL DISC SURGERY  2007   Ruptured disc, plates and screws  . COLONOSCOPY  2008  . 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 Leonides Schanz, [facility]    SocHx  reports that he has quit smoking. He has never used smokeless tobacco. He reports current alcohol use of about 3.0 standard drinks of alcohol per week. He reports that he does not use drugs.  Allergies  Allergen Reactions  . Donepezil Other (See Comments)    nightmares  . Sulfamethoxazole     Other reaction(s): Other (See Comments) Childhood allergy    FamHx Family  History  Problem Relation Age of Onset  . High blood pressure Mother   . Anxiety disorder Mother   . High blood pressure Father   . Heart attack Father     Prior to Admission medications   Medication Sig Start Date End Date Taking? Authorizing Provider  acetaminophen (TYLENOL) 500 MG tablet Take 500 mg by mouth every 6 (six) hours as needed for moderate pain.    [provider]  Azelastine HCl 0.15 % SOLN Place 1 spray into the nose in the morning and at bedtime. 11/11/16   [provider]  carbidopa-levodopa (SINEMET) 25-250 MG tablet Take 1 tablet by mouth 3 (three) times daily. 12/04/20   Kathrynn Ducking, MD  Cholecalciferol 25 MCG (1000 UT) tablet Take 1,000 Units by mouth daily.    [provider]  donepezil (ARICEPT) 5 MG tablet Take 1 tablet (5 mg total) by mouth daily after breakfast. Patient taking differently: Take 5 mg by mouth in the morning. 02/23/21   Kathrynn Ducking, MD  fluticasone [facility]) 50 MCG/ACT nasal spray Place 1 spray into both nostrils in the morning and at bedtime. 10/28/16   [provider]  gabapentin (NEURONTIN) 300 MG capsule Take 900 mg by mouth at bedtime.    [provider]  losartan-hydrochlorothiazide (HYZAAR) 100-12.5 MG tablet Take 1 tablet by mouth daily.    [provider]  meloxicam (MOBIC) 15 MG tablet  TAKE 1 TABLET BY MOUTH EVERY DAY WITH FOOD 01/05/21   Magnus Sinning, MD  Multiple Vitamins-Minerals (MULTIVITAMIN ADULTS) TABS Take 1 tablet by mouth daily.    [provider]  polyethylene glycol (MIRALAX / GLYCOLAX) 17 g packet Take 17 g by mouth daily as needed for mild constipation or moderate constipation. Also available over-the-counter 03/12/21   Rai, Vernelle Emerald, MD  pramipexole (MIRAPEX) 1 MG tablet TAKE 1 TABLET BY MOUTH THREE TIMES A DAY 04/27/21   Kathrynn Ducking, MD  silodosin (RAPAFLO) 8 MG CAPS capsule Take 8 mg by mouth every evening. 02/21/21   [provider]     Physical Exam: Vitals:   05/01/21 1330 05/01/21 1426 05/01/21 1430 05/01/21 1502  BP: 111/74 119/70 113/72 119/80  Pulse: 87 78 79 73  Resp: 20 17 14 18   Temp:      TempSrc:      SpO2: 96% 96% 95% 95%  Weight:      Height:        General: 80 y.o. male resting in bed in NAD Eyes: PERRL, normal sclera ENMT: Nares patent w/o discharge, orophaynx clear, dentition normal, ears w/o discharge/lesions/ulcers Neck: Supple, trachea midline Cardiovascular: RRR, +S1, S2, no m/g/r, equal pulses throughout Respiratory: CTABL, no w/r/r, normal WOB GI: BS+, NDNT, no masses noted, no organomegaly noted GU: foley cath in place MSK: No e/c/c; chronic venous changes in BLE Neuro: A&O x 3, rigidity noted in lower extremities Psyc: Slow interaction but flat affect, calm/cooperative  Labs on Admission: I have personally reviewed following labs and imaging studies  CBC: Recent Labs  Lab 05/01/21 1155  WBC 12.9*  NEUTROABS 11.5*  HGB 13.0  HCT 39.4  MCV 93.4  PLT 244   Basic Metabolic Panel: Recent Labs  Lab 05/01/21 1155  NA 135  K 4.0  CL 99  CO2 26  GLUCOSE 111*  BUN 19  CREATININE 1.18  CALCIUM 9.2   GFR: Estimated Creatinine Clearance: 62.1 mL/min (by C-G formula based on SCr of 1.18 mg/dL). Liver Function Tests: Recent Labs  Lab 05/01/21 1155  AST 19  ALT 14  ALKPHOS 81  BILITOT 1.1  PROT 8.1  ALBUMIN 4.2   No results for input(s): LIPASE, AMYLASE in the last 168 hours. No results for input(s): AMMONIA in the last 168 hours. Coagulation Profile: Recent Labs  Lab 05/01/21 1155  INR 1.1   Cardiac Enzymes: No results for input(s): CKTOTAL, CKMB, CKMBINDEX, TROPONINI in the last 168 hours. BNP (last 3 results) No results for input(s): PROBNP in the last 8760 hours. HbA1C: No results for input(s): HGBA1C in the last 72 hours. CBG: No results for input(s): GLUCAP in the last 168 hours. Lipid Profile: No results for input(s): CHOL, HDL, LDLCALC, TRIG,  CHOLHDL, LDLDIRECT in the last 72 hours. Thyroid Function Tests: No results for input(s): TSH, T4TOTAL, FREET4, T3FREE, THYROIDAB in the last 72 hours. Anemia Panel: No results for input(s): VITAMINB12, FOLATE, FERRITIN, TIBC, IRON, RETICCTPCT in the last 72 hours. Urine analysis:    Component Value Date/Time   COLORURINE YELLOW 05/01/2021 1220   APPEARANCEUR CLOUDY (A) 05/01/2021 1220   LABSPEC 1.020 05/01/2021 1220   PHURINE 6.0 05/01/2021 1220   GLUCOSEU NEGATIVE 05/01/2021 1220   HGBUR SMALL (A) 05/01/2021 1220   BILIRUBINUR NEGATIVE 05/01/2021 1220   KETONESUR 5 (A) 05/01/2021 1220   PROTEINUR 100 (A) 05/01/2021 1220   NITRITE POSITIVE (A) 05/01/2021 1220   LEUKOCYTESUR LARGE (A) 05/01/2021 1220    Radiological Exams  on Admission: DG Chest Port 1 View  Result Date: 05/01/2021 CLINICAL DATA:  Fever and weakness EXAM: PORTABLE CHEST 1 VIEW COMPARISON:  March 09, 2021 FINDINGS: No edema or airspace opacity. Heart size and pulmonary vascularity are normal. No adenopathy. There is postoperative change in the lower cervical region. No bone lesions. IMPRESSION: No edema or airspace opacity.  Heart size within normal limits. Electronically Signed   By: Lowella Grip III M.D.   On: 05/01/2021 12:21   Assessment/Plan UTI Urinary retention w/ chronic foley cath     - admit to inpt, med-surg     - continue rocephin; follow UCx     - cath exchange and continue urology follow up     - fluids, APAP PRN  Parkinson's disease     - continue home regimen  HTN     - continue home regimen  DVT prophylaxis: lovenox  Code Status: FULL  Family Communication: w/ dtr at bedside  Consults called: None   Status is: Inpatient  Remains inpatient appropriate because:Inpatient level of care appropriate due to severity of illness   Dispo: The patient is from: Home              Anticipated d/c is to: Home              Patient currently is not medically stable to d/c.   Difficult to place  patient No  Jonnie Finner DO [facility]  If 7PM-7AM, please contact night-coverage [email or web address]  05/01/2021, 3:13 PM

## 2021-05-01 NOTE — ED Triage Notes (Signed)
Pt presents with c/o weakness and fever. Pt does appear altered at this time, slow to answer questions. Family reports that last time he was feeling like this, he was septic. Pt does also have an indwelling catheter.

## 2021-05-01 NOTE — ED Provider Notes (Signed)
[facility] DEPT Provider Note   CSN: [id number] Arrival date & time: 05/01/21  1123     History Chief Complaint  Patient presents with  . Fever  . Weakness    Jacob Bolton is a 80 y.o. 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  I reviewed his medical record and his AKI at time of admission improved with fluids, Foley catheter placement, and antibiotics.  He was encouraged to follow-up on outpatient basis with Dr. Lovena Neighbours at [facility].  On my examination, patient reports that he felt fine yesterday.  This morning he woke up he felt so weak that he could not get up out of bed.  His wife tried to assist him to his feet, but was unable to do so.  He is typically ambulatory, albeit with assistance due to his Parkinson's disease.  Given his recent hospitalization for urosepsis, wife called daughter who subsequently called 81.  He has not yet received his morning medications.  Upon arrival to the ED, he was noted to be febrile to 102.4 F and tachycardic at 100 bpm.  Blood pressure was preserved.  He states that he has been seen [facility] on outpatient basis and still has Foley catheter placed.  He reports that they are still investigating why he is having issues with urinary retention.  His last bowel movement was 2 days ago, soft brown.  He denies any abdominal pain, new lower back pain, nausea or vomiting, chest pain or shortness of breath, numbness or weakness, changes in bowel habits, or any other symptoms.  HPI     Past Medical History:  Diagnosis Date  . High blood pressure   . Memory disorder 09/30/2019  . Parkinson disease ([place]) 12/03/2016     Patient Active Problem List   Diagnosis Date Noted  . AKI (acute kidney injury) ([place]) 03/10/2021  . Sepsis ([place]) 03/09/2021  . Acute lower UTI 03/09/2021  . Acute urinary retention 03/09/2021  . Liposarcoma of right thigh ([place]) 11/02/2020  . Memory disorder 09/30/2019  . Deep venous thrombosis ([place]) 12/16/2018  . Lipoma of right lower extremity 05/20/2017  . Parkinson disease ([place]) 12/03/2016  . BPH (benign prostatic hyperplasia) 06/16/2013  . HTN (hypertension) 06/16/2013  . Obesity (BMI 30-39.9) 06/16/2013  . 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 Lipoma 06/02/2012  . 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 CERVICAL DISC SURGERY  2007   Ruptured disc, plates and screws  . COLONOSCOPY  2008  . 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 Leonides Schanz, [facility]       Family History  Problem Relation Age of Onset  . High blood pressure Mother   . Anxiety disorder Mother   . High blood pressure Father   . Heart attack Father     Social History   Tobacco Use  . Smoking status: Former Research scientist (life sciences)  . Smokeless tobacco: Never Used  Substance Use Topics  . Alcohol use: Yes    Alcohol/week: 3.0 standard drinks    Types: 3 Glasses of wine per week    Comment: WIne 2-3 nights per week  .  Drug use: No    Home Medications Prior to Admission medications   Medication Sig Start Date End Date Taking? Authorizing Provider  acetaminophen (TYLENOL) 500 MG tablet Take 500 mg by mouth every 6 (six) hours as needed for moderate pain.    [provider]  Azelastine HCl 0.15 % SOLN Place 1 spray into the nose in the morning and at bedtime. 11/11/16   [provider]  carbidopa-levodopa (SINEMET) 25-250 MG tablet Take 1 tablet by mouth 3 (three) times daily. 12/04/20   Kathrynn Ducking, MD  Cholecalciferol 25 MCG (1000 UT) tablet Take 1,000  Units by mouth daily.    [provider]  donepezil (ARICEPT) 5 MG tablet Take 1 tablet (5 mg total) by mouth daily after breakfast. Patient taking differently: Take 5 mg by mouth in the morning. 02/23/21   Kathrynn Ducking, MD  fluticasone [facility]) 50 MCG/ACT nasal spray Place 1 spray into both nostrils in the morning and at bedtime. 10/28/16   [provider]  gabapentin (NEURONTIN) 300 MG capsule Take 900 mg by mouth at bedtime.    [provider]  losartan-hydrochlorothiazide (HYZAAR) 100-12.5 MG tablet Take 1 tablet by mouth daily.    [provider]  meloxicam (MOBIC) 15 MG tablet TAKE 1 TABLET BY MOUTH EVERY DAY WITH FOOD 01/05/21   Magnus Sinning, MD  Multiple Vitamins-Minerals (MULTIVITAMIN ADULTS) TABS Take 1 tablet by mouth daily.    [provider]  polyethylene glycol (MIRALAX / GLYCOLAX) 17 g packet Take 17 g by mouth daily as needed for mild constipation or moderate constipation. Also available over-the-counter 03/12/21   Rai, Vernelle Emerald, MD  pramipexole (MIRAPEX) 1 MG tablet TAKE 1 TABLET BY MOUTH THREE TIMES A DAY 04/27/21   Kathrynn Ducking, MD  silodosin (RAPAFLO) 8 MG CAPS capsule Take 8 mg by mouth every evening. 02/21/21   [provider]    Allergies    Donepezil and Sulfamethoxazole  Review of Systems   Review of Systems  All other systems reviewed and are negative.   Physical Exam Updated Vital Signs BP 119/70 (BP Location: Right Arm)   Pulse 78   Temp (!) 101 F (38.3 C) (Oral)   Resp 17   Ht 6' (1.829 m)   Wt 99.8 kg   SpO2 96%   BMI 29.84 kg/m   Physical Exam Vitals and nursing note reviewed. Exam conducted with a chaperone present.  Constitutional:      General: He is not in acute distress.    Appearance: He is ill-appearing. He is not toxic-appearing.  HENT:     Head: Normocephalic and atraumatic.  Eyes:     General: No scleral icterus.    Conjunctiva/sclera: Conjunctivae normal.   Cardiovascular:     Rate and Rhythm: Normal rate and regular rhythm.     Pulses: Normal pulses.  Pulmonary:     Effort: Pulmonary effort is normal. No respiratory distress.     Breath sounds: Normal breath sounds. No wheezing or rales.     Comments: CTA bilaterally. Abdominal:     General: Abdomen is flat. There is no distension.     Palpations: Abdomen is soft.     Tenderness: There is no abdominal tenderness. There is no right CVA tenderness, left CVA tenderness or guarding.     Comments: Soft, nondistended.  No abdominal TTP.  No CVA tenderness.  No overlying skin changes.  Musculoskeletal:        General: Normal range of  motion.     Cervical back: Normal range of motion. No rigidity.  Skin:    General: Skin is dry.     Findings: No rash.     Comments: No obvious rashes.  Neurological:     Mental Status: He is alert.     GCS: GCS eye subscore is 4. GCS verbal subscore is 5. GCS motor subscore is 6.  Psychiatric:        Mood and Affect: Mood normal.        Behavior: Behavior normal.        Thought Content: Thought content normal.     ED Results / Procedures / Treatments   Labs (all labs ordered are listed, but only abnormal results are displayed) Labs Reviewed  COMPREHENSIVE METABOLIC PANEL - Abnormal; Notable for the following components:      Result Value   Glucose, Bld 111 (*)    All other components within normal limits  CBC WITH DIFFERENTIAL/PLATELET - Abnormal; Notable for the following components:   WBC 12.9 (*)    Neutro Abs 11.5 (*)    Lymphs Abs 0.5 (*)    All other components within normal limits  URINALYSIS, ROUTINE W REFLEX MICROSCOPIC - Abnormal; Notable for the following components:   APPearance CLOUDY (*)    Hgb urine dipstick SMALL (*)    Ketones, ur 5 (*)    Protein, ur 100 (*)    Nitrite POSITIVE (*)    Leukocytes,Ua LARGE (*)    WBC, UA >50 (*)    Bacteria, UA MANY (*)    All other components within normal limits  CULTURE, BLOOD (ROUTINE X  2)  RESP PANEL BY RT-PCR (FLU A&B, COVID) ARPGX2  URINE CULTURE  CULTURE, BLOOD (ROUTINE X 2)  LACTIC ACID, PLASMA  LACTIC ACID, PLASMA  PROTIME-INR  APTT    EKG None  Radiology DG Chest Port 1 View  Result Date: 05/01/2021 CLINICAL DATA:  Fever and weakness EXAM: PORTABLE CHEST 1 VIEW COMPARISON:  March 09, 2021 FINDINGS: No edema or airspace opacity. Heart size and pulmonary vascularity are normal. No adenopathy. There is postoperative change in the lower cervical region. No bone lesions. IMPRESSION: No edema or airspace opacity.  Heart size within normal limits. Electronically Signed   By: Lowella Grip III M.D.   On: 05/01/2021 12:21    Procedures Procedures   Medications Ordered in ED Medications  cefTRIAXone (ROCEPHIN) 2 g in sodium chloride 0.9 % 100 mL IVPB (has no administration in time range)  0.9 %  sodium chloride infusion (has no administration in time range)  sodium chloride 0.9 % bolus 500 mL (has no administration in time range)  acetaminophen (TYLENOL) tablet 650 mg (650 mg Oral Given 05/01/21 1241)    ED Course  I have reviewed the triage vital signs and the nursing notes.  Pertinent labs & imaging results that were available during my care of the patient were reviewed by me and considered in my medical decision making (see chart for details).  Clinical Course as of 05/01/21 1447  Tue May 01, 2021  1445 I spoke with Dr. Marylyn Ishihara who will see and admit patient. [GG]    Clinical Course User Index [GG] Corena Herter, PA-C   MDM Rules/Calculators/A&P                          JJUAN MASTROGIOVANNI was evaluated in Emergency Department on 05/01/2021 for the symptoms described in the  history of present illness. He was evaluated in the context of the global COVID-19 pandemic, which necessitated consideration that the patient might be at risk for infection with the SARS-CoV-2 virus that causes COVID-19. Institutional protocols and algorithms that pertain to the evaluation  of patients at risk for COVID-19 are in a state of rapid change based on information released by regulatory bodies including the CDC and federal and state organizations. These policies and algorithms were followed during the patient's care in the ED.  I personally reviewed patient's medical chart and all notes from triage and staff during today's encounter. I have also ordered and reviewed all labs and imaging that I felt to be medically necessary in the evaluation of this patient's complaints and with consideration of their physical exam. If needed, translation services were available and utilized.   Patient in the ED with fever, tachycardia.  Endorsing weakness.  Denies any other symptoms.  We will proceed with respiratory panel by PCR, sepsis work-up, and chest x-ray.  Patient and daughter at bedside state that this feels consistent with his recent hospitalization for urosepsis.  Chest x-ray is personally reviewed and without any evidence of acute cardiopulmonary disease.  CBC with mild leukocytosis to 12.9, however lactic acid is within normal limits.  CMP is without derangement.  UA with evidence of large infection.  Cloudy urine, positive nitrite, greater than 50 WBC, and many bacteria.  WBC clumps, mucus, budding yeast, and hyaline casts all present.  Consulted pharmacy given his recent hospitalization for urosepsis and Klebsiella bacteremia.  Recommended that we initiate 2 g Rocephin IV.  Will consult hospitalist for admission given his weakness and fevers in setting of complicated UTI.  I suspect that he may benefit from urology once admitted.  He also was followed by infectious disease prior to his discharge.  I spoke with Dr. Marylyn Ishihara, hospitalist, who will see and admit patient.   Final Clinical Impression(s) / ED Diagnoses Final diagnoses:  Acute cystitis with hematuria    Rx / DC Orders ED Discharge Orders    None       Corena Herter, PA-C 05/01/21 1447    Arnaldo Natal,  MD 05/01/21 1549

## 2021-05-02 DIAGNOSIS — N390 Urinary tract infection, site not specified: Secondary | ICD-10-CM

## 2021-05-02 DIAGNOSIS — T83511A Infection and inflammatory reaction due to indwelling urethral catheter, initial encounter: Principal | ICD-10-CM

## 2021-05-02 LAB — CBC
HCT: 30.8 % — ABNORMAL LOW (ref 39.0–52.0)
Hemoglobin: 10.1 g/dL — ABNORMAL LOW (ref 13.0–17.0)
MCH: 30.3 pg (ref 26.0–34.0)
MCHC: 32.8 g/dL (ref 30.0–36.0)
MCV: 92.5 fL (ref 80.0–100.0)
Platelets: 151 10*3/uL (ref 150–400)
RBC: 3.33 MIL/uL — ABNORMAL LOW (ref 4.22–5.81)
RDW: 13.9 % (ref 11.5–15.5)
WBC: 9.1 10*3/uL (ref 4.0–10.5)
nRBC: 0 % (ref 0.0–0.2)

## 2021-05-02 LAB — COMPREHENSIVE METABOLIC PANEL
ALT: 7 U/L (ref 0–44)
AST: 12 U/L — ABNORMAL LOW (ref 15–41)
Albumin: 3.3 g/dL — ABNORMAL LOW (ref 3.5–5.0)
Alkaline Phosphatase: 58 U/L (ref 38–126)
Anion gap: 8 (ref 5–15)
BUN: 21 mg/dL (ref 8–23)
CO2: 24 mmol/L (ref 22–32)
Calcium: 8.3 mg/dL — ABNORMAL LOW (ref 8.9–10.3)
Chloride: 100 mmol/L (ref 98–111)
Creatinine, Ser: 1.07 mg/dL (ref 0.61–1.24)
GFR, Estimated: >60 mL/min (ref >60)
Glucose, Bld: 115 mg/dL — ABNORMAL HIGH (ref 70–99)
Potassium: 3.8 mmol/L (ref 3.5–5.1)
Sodium: 132 mmol/L — ABNORMAL LOW (ref 135–145)
Total Bilirubin: 0.9 mg/dL (ref 0.3–1.2)
Total Protein: 6.1 g/dL — ABNORMAL LOW (ref 6.5–8.1)

## 2021-05-02 MED ORDER — CHLORHEXIDINE GLUCONATE CLOTH 2 % EX PADS
6.0000 | MEDICATED_PAD | Freq: Every day | CUTANEOUS | Status: DC
  Administered 2021-05-02 – 2021-05-03 (×2): 6 via TOPICAL

## 2021-05-02 NOTE — Progress Notes (Signed)
Nurse tech Wyona Almas informed me that patients blood pressure was 81/56. I went into patients room to assess patient. Patient is alert and oriented x 4. Patient states that he is not lightheaded or dizzy. Will continue to monitor patient.

## 2021-05-02 NOTE — Progress Notes (Signed)
PROGRESS NOTE    KOU GUCCIARDO  EGB:[id number] DOB: 27-Oct-1941 DOA: 05/01/2021 PCP: Hulan Fess, MD   Brief Narrative:  Jacob [place] is a 80 y.o. male with medical history significant of parkinson's disease, chronic urinary retention w/ indwelling foley cath, HTN. Presenting with fever and weakness. Per his dtr's report, he has had recent issues with urinary retention and UTIs. He's been followed up with urology. He had a recent admission for sepsis secondary to UTI. He was doing ok at him until this morning. Hhe woke with a fever of 101.3. He was lethargic and weak. He was unable to get out of bed. So, his wife called for help. She was concerned that he was demonstrating the same symptoms that he did prior to his admission for sepsis. They did not try any medications or treatments for his symptoms. They deny any other aggravating or alleviating factors.  In the ED patient was noted to be febrile. UA was c/w UTI. He had an elevated white count. He was started on rocephin. [facility] was called for admission.    Asessment & Plan:   Active Problems:   UTI (urinary tract infection)  UTI likely secondary to chronic foley cath, POA     - admit to inpt, med-surg     - continue rocephin; follow UCx     - cath exchange and continue urology follow up     - fluids, APAP PRN  Parkinson's disease     - continue home regimen  HTN     - continue home regimen  DVT prophylaxis: lovenox  Code Status: FULL  Family Communication: None available  Status is: Inpt  Dispo: The patient is from: Home              Anticipated d/c is to: Home              Anticipated d/c date is: 24-48h              Patient currently NOT medically stable for discharge  Consultants:   None  Procedures:   None  Antimicrobials:  Rocephin x 3 days   Subjective: No acute issues/events overnight  Objective: Vitals:   05/01/21 2206 05/02/21 0128 05/02/21 0200 05/02/21 0535  BP:  (!) 81/56 (!) 103/56 129/78   Pulse:  81  89  Resp:  17  18  Temp: 99.6 F (37.6 C) 98.1 F (36.7 C)  99.1 F (37.3 C)  TempSrc: Oral Oral  Oral  SpO2:  96%  98%  Weight:      Height:        Intake/Output Summary (Last 24 hours) at 05/02/2021 0743 Last data filed at 05/02/2021 0600 Gross per 24 hour  Intake 770 ml  Output 1150 ml  Net -380 ml   Filed Weights   05/01/21 1218  Weight: 99.8 kg    Examination:  General exam: Appears calm and comfortable  Respiratory system: Clear to auscultation. Respiratory effort normal. Cardiovascular system: S1 & S2 heard, RRR. No JVD, murmurs, rubs, gallops or clicks. No pedal edema. Gastrointestinal system: Abdomen is nondistended, soft and nontender. No organomegaly or masses felt. Normal bowel sounds heard. Central nervous system: Alert and oriented. No focal neurological deficits. Extremities: Symmetric 5 x 5 power. Skin: No rashes, lesions or ulcers Psychiatry: Judgement and insight appear normal. Mood & affect appropriate.     Data Reviewed: I have personally reviewed following labs and imaging studies  CBC: Recent Labs  Lab 05/01/21 1155 05/02/21  0450  WBC 12.9* 9.1  NEUTROABS 11.5*  --   HGB 13.0 10.1*  HCT 39.4 30.8*  MCV 93.4 92.5  PLT 204 132   Basic Metabolic Panel: Recent Labs  Lab 05/01/21 1155 05/02/21 0450  NA 135 132*  K 4.0 3.8  CL 99 100  CO2 26 24  GLUCOSE 111* 115*  BUN 19 21  CREATININE 1.18 1.07  CALCIUM 9.2 8.3*   GFR: Estimated Creatinine Clearance: 68.5 mL/min (by C-G formula based on SCr of 1.07 mg/dL). Liver Function Tests: Recent Labs  Lab 05/01/21 1155 05/02/21 0450  AST 19 12*  ALT 14 7  ALKPHOS 81 58  BILITOT 1.1 0.9  PROT 8.1 6.1*  ALBUMIN 4.2 3.3*   No results for input(s): LIPASE, AMYLASE in the last 168 hours. No results for input(s): AMMONIA in the last 168 hours. Coagulation Profile: Recent Labs  Lab 05/01/21 1155  INR 1.1   Cardiac Enzymes: No results for input(s): CKTOTAL, CKMB,  CKMBINDEX, TROPONINI in the last 168 hours. BNP (last 3 results) No results for input(s): PROBNP in the last 8760 hours. HbA1C: No results for input(s): HGBA1C in the last 72 hours. CBG: No results for input(s): GLUCAP in the last 168 hours. Lipid Profile: No results for input(s): CHOL, HDL, LDLCALC, TRIG, CHOLHDL, LDLDIRECT in the last 72 hours. Thyroid Function Tests: No results for input(s): TSH, T4TOTAL, FREET4, T3FREE, THYROIDAB in the last 72 hours. Anemia Panel: No results for input(s): VITAMINB12, FOLATE, FERRITIN, TIBC, IRON, RETICCTPCT in the last 72 hours. Sepsis Labs: Recent Labs  Lab 05/01/21 1155 05/01/21 1332  LATICACIDVEN 1.4 0.9    Recent Results (from the past 240 hour(s))  Blood Culture (routine x 2)     Status: None (Preliminary result)   Collection Time: 05/01/21 11:55 AM   Specimen: BLOOD  Result Value Ref Range Status   Specimen Description   Final    BLOOD LEFT ANTECUBITAL Performed at [facility], [place] [address] [place], [place] [address]    Special Requests   Final    BOTTLES DRAWN AEROBIC AND ANAEROBIC Blood Culture results may not be optimal due to an inadequate volume of blood received in culture bottles Performed at [place] [address], [place],  [address]    Culture PENDING  Incomplete   Report Status PENDING  Incomplete  Resp Panel by RT-PCR (Flu A&B, Covid) Nasopharyngeal Swab     Status: None   Collection Time: 05/01/21 12:24 PM   Specimen: Nasopharyngeal Swab; Nasopharyngeal(NP) swabs in vial transport medium  Result Value Ref Range Status   SARS Coronavirus 2 by RT PCR NEGATIVE NEGATIVE Final    Comment: (NOTE) SARS-CoV-2 target nucleic acids are NOT DETECTED.  The SARS-CoV-2 RNA is generally detectable in upper respiratory specimens during the acute phase of infection. The lowest concentration of SARS-CoV-2 viral copies this assay can detect is 138 copies/mL. A negative result does not  preclude SARS-Cov-2 infection and should not be used as the sole basis for treatment or other patient management decisions. A negative result may occur with  improper specimen collection/handling, submission of specimen other than nasopharyngeal swab, presence of viral mutation(s) within the areas targeted by this assay, and inadequate number of viral copies(<138 copies/mL). A negative result must be combined with clinical observations, patient history, and epidemiological information. The expected result is Negative.  Fact Sheet for Patients:  EntrepreneurPulse.com.au  Fact Sheet for Healthcare Providers:  IncredibleEmployment.be  This test is no t yet  approved or cleared by the [place] and  has been authorized for detection and/or diagnosis of SARS-CoV-2 by FDA under an Emergency Use Authorization (EUA). This EUA will remain  in effect (meaning this test can be used) for the duration of the COVID-19 declaration under Section 564(b)(1) of the Act, 21 U.S.C.section 360bbb-3(b)(1), unless the authorization is terminated  or revoked sooner.       Influenza A by PCR NEGATIVE NEGATIVE Final   Influenza B by PCR NEGATIVE NEGATIVE Final    Comment: (NOTE) The Xpert Xpress SARS-CoV-2/FLU/RSV plus assay is intended as an aid in the diagnosis of influenza from Nasopharyngeal swab specimens and should not be used as a sole basis for treatment. Nasal washings and aspirates are unacceptable for Xpert Xpress SARS-CoV-2/FLU/RSV testing.  Fact Sheet for Patients: EntrepreneurPulse.com.au  Fact Sheet for Healthcare Providers: IncredibleEmployment.be  This test is not yet approved or cleared by the [place] FDA and has been authorized for detection and/or diagnosis of SARS-CoV-2 by FDA under an Emergency Use Authorization (EUA). This EUA will remain in effect (meaning this test can be used) for the  duration of the COVID-19 declaration under Section 564(b)(1) of the Act, 21 U.S.C. section 360bbb-3(b)(1), unless the authorization is terminated or revoked.  Performed at [facility], [place] [address] [place], [place] [address]          Radiology Studies: [facility] Chest Port 1 View  Result Date: 05/01/2021 CLINICAL DATA:  Fever and weakness EXAM: PORTABLE CHEST 1 VIEW COMPARISON:  March 09, 2021 FINDINGS: No edema or airspace opacity. Heart size and pulmonary vascularity are normal. No adenopathy. There is postoperative change in the lower cervical region. No bone lesions. IMPRESSION: No edema or airspace opacity.  Heart size within normal limits. Electronically Signed   By: Lowella Grip III M.D.   On: 05/01/2021 12:21        Scheduled Meds: . azelastine  1 spray Each Nare BH-qamhs  . carbidopa-levodopa  1 tablet Oral TID  . cholecalciferol  1,000 Units Oral Daily  . enoxaparin (LOVENOX) injection  40 mg Subcutaneous Q24H  . fluticasone  1 spray Each Nare BH-qamhs  . losartan  100 mg Oral Daily   And  . hydrochlorothiazide  12.5 mg Oral Daily  . pramipexole  1 mg Oral TID  . terazosin  10 mg Oral Daily   Continuous Infusions: . cefTRIAXone (ROCEPHIN)  IV       LOS: 1 day    Time spent: 59min  Tedd Cottrill C Li Fragoso, DO [facility]  If 7PM-7AM, please contact night-coverage [email or web address]  05/02/2021, 7:43 AM

## 2021-05-03 LAB — URINE CULTURE: Culture: >=100000 — AB

## 2021-05-03 LAB — BASIC METABOLIC PANEL
Anion gap: 10 (ref 5–15)
BUN: 21 mg/dL (ref 8–23)
CO2: 24 mmol/L (ref 22–32)
Calcium: 8.7 mg/dL — ABNORMAL LOW (ref 8.9–10.3)
Chloride: 103 mmol/L (ref 98–111)
Creatinine, Ser: 0.88 mg/dL (ref 0.61–1.24)
GFR, Estimated: >60 mL/min (ref >60)
Glucose, Bld: 111 mg/dL — ABNORMAL HIGH (ref 70–99)
Potassium: 3.9 mmol/L (ref 3.5–5.1)
Sodium: 137 mmol/L (ref 135–145)

## 2021-05-03 LAB — CBC
HCT: 32.1 % — ABNORMAL LOW (ref 39.0–52.0)
Hemoglobin: 10.6 g/dL — ABNORMAL LOW (ref 13.0–17.0)
MCH: 30.4 pg (ref 26.0–34.0)
MCHC: 33 g/dL (ref 30.0–36.0)
MCV: 92 fL (ref 80.0–100.0)
Platelets: 151 10*3/uL (ref 150–400)
RBC: 3.49 MIL/uL — ABNORMAL LOW (ref 4.22–5.81)
RDW: 13.7 % (ref 11.5–15.5)
WBC: 7.5 10*3/uL (ref 4.0–10.5)
nRBC: 0 % (ref 0.0–0.2)

## 2021-05-03 MED ORDER — FOSFOMYCIN TROMETHAMINE 3 G PO PACK
3.0000 g | PACK | Freq: Once | ORAL | Status: AC
  Administered 2021-05-03: 3 g via ORAL
  Filled 2021-05-03: qty 3

## 2021-05-03 NOTE — Discharge Summary (Signed)
Physician Discharge Summary  Jacob Bolton:[id number] DOB: 1941-05-19 DOA: 05/01/2021  PCP: Hulan Fess, MD  Admit date: 05/01/2021 Discharge date: 05/03/2021  Admitted From: Home Disposition:  Home  Recommendations for Outpatient Follow-up:  1. Follow up with PCP in 1-2 weeks 2. Please obtain BMP/CBC in one week  Discharge Condition: Stable CODE STATUS: Full Diet recommendation: As tolerated  Brief/Interim Summary: Jacob Bolton a 80 y.o.malewith medical history significant ofparkinson's disease, chronic urinary retention w/ indwelling foley cath, HTN. Presenting with fever and weakness. Per his dtr's report, he has had recent issues with urinary retention and UTIs. He's been followed up with urology. He had a recent admission for sepsis secondary to UTI. He was doing ok at him until this morning. He woke with a fever of 101.3. He was lethargic and weak. He was unable to get out of bed. So, his wife called for help. She was concerned that he was demonstrating the same symptoms that he did prior to his admission for sepsis. They did not try any medications or treatments for his symptoms. They deny any other aggravating or alleviating factors. In the ED patient was noted to be febrile. UA was c/w UTI. He had an elevated white count. He was started on rocephin. [facility] was called for admission.  Patient mated with acute onset lethargy, weakness with fever, given chronic urinary retention and indwelling Foley catheter patient is high risk for UTI with multiple previous UTI per documentation.  Patient's cultures resulted today with Enterobacter cloacae, he did not meet sepsis criteria, discussed with pharmacy and infectious disease, given negative blood cultures will proceed with fosfomycin x1 dose to complete antibiotic course today.  Patient states he feels quite well, after Foley exchange urine appears to be clear and otherwise patient stable and agreeable for discharge home.  We discussed  need for close monitoring of symptoms and close follow-up with PCP in the next week as scheduled.  No new medications at discharge, patient has completed antibiotic course as below.  Discharge Diagnoses:  Active Problems:   UTI (urinary tract infection)    Discharge Instructions  Discharge Instructions    Call MD for:  temperature >100.4   Complete by: As directed    Diet - low sodium heart healthy   Complete by: As directed    Increase activity slowly   Complete by: As directed      Allergies as of 05/03/2021      Reactions   Donepezil Other (See Comments)   nightmares   Sulfamethoxazole    Other reaction(s): Other (See Comments) Childhood allergy      Medication List    STOP taking these medications   meloxicam 15 MG tablet Commonly known as: MOBIC     TAKE these medications   acetaminophen 500 MG tablet Commonly known as: TYLENOL Take 500 mg by mouth every 6 (six) hours as needed for moderate pain.   Azelastine HCl 0.15 % Soln Place 1 spray into the nose in the morning and at bedtime.   carbidopa-levodopa 25-250 MG tablet Commonly known as: Sinemet Take 1 tablet by mouth 3 (three) times daily.   Cholecalciferol 25 MCG (1000 UT) tablet Take 1,000 Units by mouth daily.   donepezil 5 MG tablet Commonly known as: ARICEPT Take 1 tablet (5 mg total) by mouth daily after breakfast. What changed: when to take this   fluticasone 50 MCG/ACT nasal spray Commonly known as: FLONASE Place 1 spray into both nostrils in the morning and at  bedtime.   losartan-hydrochlorothiazide 100-12.5 MG tablet Commonly known as: HYZAAR Take 1 tablet by mouth daily.   polyethylene glycol 17 g packet Commonly known as: MIRALAX / GLYCOLAX Take 17 g by mouth daily as needed for mild constipation or moderate constipation. Also available over-the-counter   pramipexole 1 MG tablet Commonly known as: MIRAPEX TAKE 1 TABLET BY MOUTH THREE TIMES A DAY   terazosin 10 MG  capsule Commonly known as: HYTRIN Take 10 mg by mouth daily.       Allergies  Allergen Reactions  . Donepezil Other (See Comments)    nightmares  . Sulfamethoxazole     Other reaction(s): Other (See Comments) Childhood allergy    Consultations:  None   Procedures/Studies: DG Chest Port 1 View  Result Date: 05/01/2021 CLINICAL DATA:  Fever and weakness EXAM: PORTABLE CHEST 1 VIEW COMPARISON:  March 09, 2021 FINDINGS: No edema or airspace opacity. Heart size and pulmonary vascularity are normal. No adenopathy. There is postoperative change in the lower cervical region. No bone lesions. IMPRESSION: No edema or airspace opacity.  Heart size within normal limits. Electronically Signed   By: Lowella Grip III M.D.   On: 05/01/2021 12:21      Subjective: No acute issues or events overnight   Discharge Exam: Vitals:   05/02/21 2011 05/03/21 0347  BP: 134/81 131/87  Pulse: 88 77  Resp: 15 15  Temp: 98.1 F (36.7 C) 98.8 F (37.1 C)  SpO2: 98% 97%   Vitals:   05/02/21 0535 05/02/21 1430 05/02/21 2011 05/03/21 0347  BP: 129/78 106/63 134/81 131/87  Pulse: 89 81 88 77  Resp: 18 16 15 15   Temp: 99.1 F (37.3 C) 98.1 F (36.7 C) 98.1 F (36.7 C) 98.8 F (37.1 C)  TempSrc: Oral Oral Oral Oral  SpO2: 98% 99% 98% 97%  Weight:      Height:        General: Pt is alert, awake, not in acute distress Cardiovascular: RRR, S1/S2 +, no rubs, no gallops Respiratory: CTA bilaterally, no wheezing, no rhonchi Abdominal: Soft, NT, ND, bowel sounds +; Foley draining clear yellow urine Extremities: no edema, no cyanosis    The results of significant diagnostics from this hospitalization (including imaging, microbiology, ancillary and laboratory) are listed below for reference.     Microbiology: Recent Results (from the past 240 hour(s))  Blood Culture (routine x 2)     Status: None (Preliminary result)   Collection Time: 05/01/21 11:55 AM   Specimen: BLOOD  Result Value  Ref Range Status   Specimen Description   Final    BLOOD LEFT ANTECUBITAL Performed at [facility], [place] [address] [place], [place] [address]    Special Requests   Final    BOTTLES DRAWN AEROBIC AND ANAEROBIC Blood Culture results may not be optimal due to an inadequate volume of blood received in culture bottles Performed at [facility] [address] [place], [place] [address]    Culture   Final    NO GROWTH 2 DAYS Performed at [facility] [address] [place], [place] [address]    Report Status PENDING  Incomplete  Blood Culture (routine x 2)     Status: None (Preliminary result)   Collection Time: 05/01/21 12:09 PM   Specimen: BLOOD LEFT WRIST  Result Value Ref Range Status   Specimen Description   Final    BLOOD LEFT WRIST Performed at [facility]  [address] [place], [place] [address]    Special Requests   Final    BOTTLES DRAWN AEROBIC AND ANAEROBIC Blood Culture results may not be optimal due to an inadequate volume of blood received in culture bottles Performed at [facility] [address] [place], [place] [address]    Culture   Final    NO GROWTH 2 DAYS Performed at [facility] [address] [place], [place] [address]    Report Status PENDING  Incomplete  Urine culture     Status: Abnormal   Collection Time: 05/01/21 12:20 PM   Specimen: In/Out Cath Urine  Result Value Ref Range Status   Specimen Description   Final    IN/OUT CATH URINE Performed at [facility] [address], [place], [place] [address]    Special Requests   Final    NONE Performed at [facility], [place] [address], [place], [place] [address]    Culture >=100,000 COLONIES/mL ENTEROBACTER CLOACAE (A)  Final   Report Status 05/03/2021 FINAL  Final   Organism ID, Bacteria ENTEROBACTER CLOACAE (A)  Final      Susceptibility   Enterobacter cloacae - MIC*     CEFAZOLIN >=64 RESISTANT Resistant     CEFEPIME <=0.12 SENSITIVE Sensitive     CIPROFLOXACIN <=0.25 SENSITIVE Sensitive     GENTAMICIN <=1 SENSITIVE Sensitive     IMIPENEM <=0.25 SENSITIVE Sensitive     NITROFURANTOIN <=16 SENSITIVE Sensitive     TRIMETH/SULFA <=20 SENSITIVE Sensitive     PIP/TAZO 8 SENSITIVE Sensitive     * >=100,000 COLONIES/mL ENTEROBACTER CLOACAE  Resp Panel by RT-PCR (Flu A&B, Covid) Nasopharyngeal Swab     Status: None   Collection Time: 05/01/21 12:24 PM   Specimen: Nasopharyngeal Swab; Nasopharyngeal(NP) swabs in vial transport medium  Result Value Ref Range Status   SARS Coronavirus 2 by RT PCR NEGATIVE NEGATIVE Final    Comment: (NOTE) SARS-CoV-2 target nucleic acids are NOT DETECTED.  The SARS-CoV-2 RNA is generally detectable in upper respiratory specimens during the acute phase of infection. The lowest concentration of SARS-CoV-2 viral copies this assay can detect is 138 copies/mL. A negative result does not preclude SARS-Cov-2 infection and should not be used as the sole basis for treatment or other patient management decisions. A negative result may occur with  improper specimen collection/handling, submission of specimen other than nasopharyngeal swab, presence of viral mutation(s) within the areas targeted by this assay, and inadequate number of viral copies(<138 copies/mL). A negative result must be combined with clinical observations, patient history, and epidemiological information. The expected result is Negative.  Fact Sheet for Patients:  EntrepreneurPulse.com.au  Fact Sheet for Healthcare Providers:  IncredibleEmployment.be  This test is no t yet approved or cleared by the [place] FDA and  has been authorized for detection and/or diagnosis of SARS-CoV-2 by FDA under an Emergency Use Authorization (EUA). This EUA will remain  in effect (meaning this test can be used) for the duration of  the COVID-19 declaration under Section 564(b)(1) of the Act, 21 U.S.C.section 360bbb-3(b)(1), unless the authorization is terminated  or revoked sooner.       Influenza A by PCR NEGATIVE NEGATIVE Final   Influenza B by PCR NEGATIVE NEGATIVE Final    Comment: (NOTE) The Xpert Xpress SARS-CoV-2/FLU/RSV plus assay is intended as an aid in the diagnosis of influenza from Nasopharyngeal swab specimens and should not be used as a sole basis for treatment. Nasal washings and  aspirates are unacceptable for Xpert Xpress SARS-CoV-2/FLU/RSV testing.  Fact Sheet for Patients: EntrepreneurPulse.com.au  Fact Sheet for Healthcare Providers: IncredibleEmployment.be  This test is not yet approved or cleared by the [place] FDA and has been authorized for detection and/or diagnosis of SARS-CoV-2 by FDA under an Emergency Use Authorization (EUA). This EUA will remain in effect (meaning this test can be used) for the duration of the COVID-19 declaration under Section 564(b)(1) of the Act, 21 U.S.C. section 360bbb-3(b)(1), unless the authorization is terminated or revoked.  Performed at [facility], [place] [address], [place], [place] [address]      Labs: BNP (last 3 results) No results for input(s): BNP in the last 8760 hours. Basic Metabolic Panel: Recent Labs  Lab 05/01/21 1155 05/02/21 0450 05/03/21 0520  NA 135 132* 137  K 4.0 3.8 3.9  CL 99 100 103  CO2 26 24 24   GLUCOSE 111* 115* 111*  BUN 19 21 21   CREATININE 1.18 1.07 0.88  CALCIUM 9.2 8.3* 8.7*   Liver Function Tests: Recent Labs  Lab 05/01/21 1155 05/02/21 0450  AST 19 12*  ALT 14 7  ALKPHOS 81 58  BILITOT 1.1 0.9  PROT 8.1 6.1*  ALBUMIN 4.2 3.3*   No results for input(s): LIPASE, AMYLASE in the last 168 hours. No results for input(s): AMMONIA in the last 168 hours. CBC: Recent Labs  Lab 05/01/21 1155 05/02/21 0450 05/03/21 0520  WBC 12.9* 9.1 7.5   NEUTROABS 11.5*  --   --   HGB 13.0 10.1* 10.6*  HCT 39.4 30.8* 32.1*  MCV 93.4 92.5 92.0  PLT 204 151 151   Cardiac Enzymes: No results for input(s): CKTOTAL, CKMB, CKMBINDEX, TROPONINI in the last 168 hours. BNP: Invalid input(s): POCBNP CBG: No results for input(s): GLUCAP in the last 168 hours. D-Dimer No results for input(s): DDIMER in the last 72 hours. Hgb A1c No results for input(s): HGBA1C in the last 72 hours. Lipid Profile No results for input(s): CHOL, HDL, LDLCALC, TRIG, CHOLHDL, LDLDIRECT in the last 72 hours. Thyroid function studies No results for input(s): TSH, T4TOTAL, T3FREE, THYROIDAB in the last 72 hours.  Invalid input(s): FREET3 Anemia work up No results for input(s): VITAMINB12, FOLATE, FERRITIN, TIBC, IRON, RETICCTPCT in the last 72 hours. Urinalysis    Component Value Date/Time   COLORURINE YELLOW 05/01/2021 1220   APPEARANCEUR CLOUDY (A) 05/01/2021 1220   LABSPEC 1.020 05/01/2021 1220   PHURINE 6.0 05/01/2021 1220   GLUCOSEU NEGATIVE 05/01/2021 1220   HGBUR SMALL (A) 05/01/2021 1220   BILIRUBINUR NEGATIVE 05/01/2021 1220   KETONESUR 5 (A) 05/01/2021 1220   PROTEINUR 100 (A) 05/01/2021 1220   NITRITE POSITIVE (A) 05/01/2021 1220   LEUKOCYTESUR LARGE (A) 05/01/2021 1220   Sepsis Labs Invalid input(s): PROCALCITONIN,  WBC,  LACTICIDVEN Microbiology Recent Results (from the past 240 hour(s))  Blood Culture (routine x 2)     Status: None (Preliminary result)   Collection Time: 05/01/21 11:55 AM   Specimen: BLOOD  Result Value Ref Range Status   Specimen Description   Final    BLOOD LEFT ANTECUBITAL Performed at [facility], [place] [address] [place], [place] [address]    Special Requests   Final    BOTTLES DRAWN AEROBIC AND ANAEROBIC Blood Culture results may not be optimal due to an inadequate volume of blood received in culture bottles Performed at [facility] [address] [place], [place] [address]     Culture  Final    NO GROWTH 2 DAYS Performed at [facility], [place] [address], [place], [place] [address]    Report Status PENDING  Incomplete  Blood Culture (routine x 2)     Status: None (Preliminary result)   Collection Time: 05/01/21 12:09 PM   Specimen: BLOOD LEFT WRIST  Result Value Ref Range Status   Specimen Description   Final    BLOOD LEFT WRIST Performed at [facility] [address], [place], [place] [address]    Special Requests   Final    BOTTLES DRAWN AEROBIC AND ANAEROBIC Blood Culture results may not be optimal due to an inadequate volume of blood received in culture bottles Performed at [facility] [address], [place], [place] [address]    Culture   Final    NO GROWTH 2 DAYS Performed at [facility] [address] [place],  [address]    Report Status PENDING  Incomplete  Urine culture     Status: Abnormal   Collection Time: 05/01/21 12:20 PM   Specimen: In/Out Cath Urine  Result Value Ref Range Status   Specimen Description   Final    IN/OUT CATH URINE Performed at [facility] [address], [place],  [address]    Special Requests   Final    NONE Performed at [facility], [place] [address] [place],  [address]    Culture >=100,000 COLONIES/mL ENTEROBACTER CLOACAE (A)  Final   Report Status 05/03/2021 FINAL  Final   Organism ID, Bacteria ENTEROBACTER CLOACAE (A)  Final      Susceptibility   Enterobacter cloacae - MIC*    CEFAZOLIN >=64 RESISTANT Resistant     CEFEPIME <=0.12 SENSITIVE Sensitive     CIPROFLOXACIN <=0.25 SENSITIVE Sensitive     GENTAMICIN <=1 SENSITIVE Sensitive     IMIPENEM <=0.25 SENSITIVE Sensitive     NITROFURANTOIN <=16 SENSITIVE Sensitive     TRIMETH/SULFA <=20 SENSITIVE Sensitive     PIP/TAZO 8 SENSITIVE Sensitive     * >=100,000 COLONIES/mL ENTEROBACTER CLOACAE  Resp Panel by RT-PCR (Flu A&B, Covid)  Nasopharyngeal Swab     Status: None   Collection Time: 05/01/21 12:24 PM   Specimen: Nasopharyngeal Swab; Nasopharyngeal(NP) swabs in vial transport medium  Result Value Ref Range Status   SARS Coronavirus 2 by RT PCR NEGATIVE NEGATIVE Final    Comment: (NOTE) SARS-CoV-2 target nucleic acids are NOT DETECTED.  The SARS-CoV-2 RNA is generally detectable in upper respiratory specimens during the acute phase of infection. The lowest concentration of SARS-CoV-2 viral copies this assay can detect is 138 copies/mL. A negative result does not preclude SARS-Cov-2 infection and should not be used as the sole basis for treatment or other patient management decisions. A negative result may occur with  improper specimen collection/handling, submission of specimen other than nasopharyngeal swab, presence of viral mutation(s) within the areas targeted by this assay, and inadequate number of viral copies(<138 copies/mL). A negative result must be combined with clinical observations, patient history, and epidemiological information. The expected result is Negative.  Fact Sheet for Patients:  EntrepreneurPulse.com.au  Fact Sheet for Healthcare Providers:  IncredibleEmployment.be  This test is no t yet approved or cleared by the [place] FDA and  has been authorized for detection and/or diagnosis of SARS-CoV-2 by FDA under an Emergency Use Authorization (EUA). This EUA will remain  in effect (meaning this test can be used) for the duration  of the COVID-19 declaration under Section 564(b)(1) of the Act, 21 U.S.C.section 360bbb-3(b)(1), unless the authorization is terminated  or revoked sooner.       Influenza A by PCR NEGATIVE NEGATIVE Final   Influenza B by PCR NEGATIVE NEGATIVE Final    Comment: (NOTE) The Xpert Xpress SARS-CoV-2/FLU/RSV plus assay is intended as an aid in the diagnosis of influenza from Nasopharyngeal swab specimens and should not be  used as a sole basis for treatment. Nasal washings and aspirates are unacceptable for Xpert Xpress SARS-CoV-2/FLU/RSV testing.  Fact Sheet for Patients: EntrepreneurPulse.com.au  Fact Sheet for Healthcare Providers: IncredibleEmployment.be  This test is not yet approved or cleared by the [place] FDA and has been authorized for detection and/or diagnosis of SARS-CoV-2 by FDA under an Emergency Use Authorization (EUA). This EUA will remain in effect (meaning this test can be used) for the duration of the COVID-19 declaration under Section 564(b)(1) of the Act, 21 U.S.C. section 360bbb-3(b)(1), unless the authorization is terminated or revoked.  Performed at [facility], [place] [address] [place], [place] [address]      Time coordinating discharge: Over 30 minutes  SIGNED:   Little Ishikawa, DO [facility] 05/03/2021, 1:55 PM

## 2021-05-06 LAB — CULTURE, BLOOD (ROUTINE X 2)
Culture: NO GROWTH
Culture: NO GROWTH

## 2021-05-10 ENCOUNTER — Ambulatory Visit: Payer: PPO | Admitting: Neurology

## 2021-05-10 ENCOUNTER — Encounter: Payer: Self-pay | Admitting: Neurology

## 2021-05-10 ENCOUNTER — Telehealth: Payer: Self-pay | Admitting: Neurology

## 2021-05-10 VITALS — BP 105/69 | HR 88 | Ht 71.5 in | Wt 200.4 lb

## 2021-05-10 DIAGNOSIS — R413 Other amnesia: Secondary | ICD-10-CM | POA: Diagnosis not present

## 2021-05-10 DIAGNOSIS — G20 Parkinson's disease: Secondary | ICD-10-CM | POA: Diagnosis not present

## 2021-05-10 MED ORDER — DONEPEZIL HCL 5 MG PO TABS: 5.0000 mg | ORAL_TABLET | Freq: Every day | ORAL | 3 refills | Status: AC

## 2021-05-10 NOTE — Progress Notes (Signed)
Reason for visit: Parkinson's disease, memory disturbance  Jacob Bolton is an 80 y.o. male  History of present illness:  Jacob Bolton is a 80 year old right-handed white male with a history of Parkinson's disease associated with a memory disorder.  The patient was recently in the hospital on 01 May 2021 with a urinary tract infection.  The patient has had an indwelling catheter since February 2022.  He just finished physical therapy following a previous bladder infection in mid April 2022.  He has had worsening of his parkinsonian symptoms since the most recent hospitalization but he is gradually improving.  Initially, he was having to use a walker for ambulation but he is now walking independently.  He is on Sinemet taking the 25/250 mg tablets, he takes 1 tablet 3 times daily.  He is on Mirapex taking 1 mg 3 times daily.  He has not had any falls.  He does have some increased confusion in the evening hours.  Overall he continues to have some problems with memory.  He returns for an evaluation.  He did have 1 fall about a month ago, he landed on his knees when he was trying to run up a flight of stairs.  The patient reports some occasional vivid dreams.  He takes Aricept in the morning as it caused more troubles with dreams and evening.  Past Medical History:  Diagnosis Date  . High blood pressure   . Memory disorder 09/30/2019  . Parkinson disease ([place]) 12/03/2016    Past Surgical History:  Procedure Laterality Date  . CERVICAL DISC SURGERY  2007   Ruptured disc, plates and screws  . COLONOSCOPY  2008  . 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 Leonides Schanz, [facility]    Family History  Problem Relation Age of Onset  . High blood pressure Mother   . Anxiety disorder Mother   . High blood pressure Father   . Heart attack Father     Social history:  reports that he has quit smoking. He has never used smokeless tobacco. He reports current alcohol use of about 3.0  standard drinks of alcohol per week. He reports that he does not use drugs.    Allergies  Allergen Reactions  . Donepezil Other (See Comments)    nightmares  . Sulfamethoxazole     Other reaction(s): Other (See Comments) Childhood allergy    Medications:  Prior to Admission medications   Medication Sig Start Date End Date Taking? Authorizing Provider  acetaminophen (TYLENOL) 500 MG tablet Take 500 mg by mouth every 6 (six) hours as needed for moderate pain.   Yes [provider]  Azelastine HCl 0.15 % SOLN Place 1 spray into the nose in the morning and at bedtime. 11/11/16  Yes [provider]  carbidopa-levodopa (SINEMET) 25-250 MG tablet Take 1 tablet by mouth 3 (three) times daily. 12/04/20  Yes Kathrynn Ducking, MD  Cholecalciferol 25 MCG (1000 UT) tablet Take 1,000 Units by mouth daily.   Yes [provider]  donepezil (ARICEPT) 5 MG tablet Take 1 tablet (5 mg total) by mouth daily after breakfast. Patient taking differently: Take 5 mg by mouth in the morning. 02/23/21  Yes Kathrynn Ducking, MD  fluticasone [facility]) 50 MCG/ACT nasal spray Place 1 spray into both nostrils in the morning and at bedtime. 10/28/16  Yes [provider]  losartan-hydrochlorothiazide (HYZAAR) 100-12.5 MG tablet Take 1 tablet by mouth daily.   Yes  [provider]  polyethylene glycol (MIRALAX / GLYCOLAX) 17 g packet Take 17 g by mouth daily as needed for mild constipation or moderate constipation. Also available over-the-counter 03/12/21  Yes Rai, Ripudeep K, MD  pramipexole (MIRAPEX) 1 MG tablet TAKE 1 TABLET BY MOUTH THREE TIMES A DAY Patient taking differently: Take 1 mg by mouth 3 (three) times daily. 04/27/21  Yes Kathrynn Ducking, MD  terazosin (HYTRIN) 10 MG capsule Take 10 mg by mouth daily. 04/28/21  Yes [provider]    ROS:  Out of a complete 14 system review of symptoms, the patient complains only of the following symptoms, and all other  reviewed systems are negative.  Memory problems Walking difficulty Vivid dreams  Blood pressure 105/69, pulse 88, height 5' 11.5" (1.816 m), weight 200 lb 6.4 oz (90.9 kg).  Physical Exam  General: The patient is alert and cooperative at the time of the examination.  Skin: No significant peripheral edema is noted.   Neurologic Exam  Mental status: The patient is alert and oriented x 3 at the time of the examination. The Mini-Mental status examination done today shows a total score 23/30.   Cranial nerves: Facial symmetry is present. Speech is normal, no aphasia or dysarthria is noted. Extraocular movements are full. Visual fields are full.  Masking the face is seen.  Motor: The patient has good strength in all 4 extremities.  Sensory examination: Soft touch sensation is symmetric on the face, arms, and legs.  Coordination: The patient has good finger-nose-finger and heel-to-shin bilaterally.  Gait and station: The patient has difficulty arising from a seated position with arms crossed.  Once up, he can walk independently, decreased arm swing seen bilaterally.  No tremor is noted.  Tandem gait was not attempted.  Romberg is negative.  Reflexes: Deep tendon reflexes are symmetric.   Assessment/Plan:  1.  Parkinson's disease  2.  Memory disturbance  The patient has had recurrent bladder infections.  The patient will go on vitamin C taking 500 mg 3 times daily and cranberry tablets daily.  The patient will remain on his current dose of Sinemet and Mirapex, he will follow-up here in 5 months.  If he does not continue to improve with his ability to ambulate, they will contact me over the next several weeks and I will reorder physical therapy.  [name] MD 05/10/2021 10:02 AM  [facility] [address] [place],  [address]  Phone [phone] Fax [phone]

## 2021-05-10 NOTE — Telephone Encounter (Signed)
I tried to scheduled patient a follow-up with Dr. Rexene Alberts in October but the voice mail is full.

## 2021-05-10 NOTE — Telephone Encounter (Signed)
Return in about 5 months (around 10/10/2021). Patient did not want a 7:30am appointment, but that is all that is available with Dr. Jannifer Franklin in October. They said they would rather wait for a phone call to be worked-in for a later time.

## 2021-05-14 DIAGNOSIS — N390 Urinary tract infection, site not specified: Secondary | ICD-10-CM | POA: Diagnosis not present

## 2021-05-14 DIAGNOSIS — M79671 Pain in right foot: Secondary | ICD-10-CM | POA: Diagnosis not present

## 2021-05-14 DIAGNOSIS — Z09 Encounter for follow-up examination after completed treatment for conditions other than malignant neoplasm: Secondary | ICD-10-CM | POA: Diagnosis not present

## 2021-05-14 DIAGNOSIS — I10 Essential (primary) hypertension: Secondary | ICD-10-CM | POA: Diagnosis not present

## 2021-05-22 DIAGNOSIS — R338 Other retention of urine: Secondary | ICD-10-CM | POA: Diagnosis not present

## 2021-06-07 ENCOUNTER — Other Ambulatory Visit: Payer: Self-pay | Admitting: Physical Medicine and Rehabilitation

## 2021-06-07 ENCOUNTER — Other Ambulatory Visit: Payer: Self-pay | Admitting: Neurology

## 2021-06-07 DIAGNOSIS — G20 Parkinson's disease: Secondary | ICD-10-CM | POA: Diagnosis not present

## 2021-06-07 DIAGNOSIS — N401 Enlarged prostate with lower urinary tract symptoms: Secondary | ICD-10-CM | POA: Diagnosis not present

## 2021-06-07 DIAGNOSIS — R3914 Feeling of incomplete bladder emptying: Secondary | ICD-10-CM | POA: Diagnosis not present

## 2021-06-07 DIAGNOSIS — R338 Other retention of urine: Secondary | ICD-10-CM | POA: Diagnosis not present

## 2021-06-07 NOTE — Telephone Encounter (Signed)
Please advise 

## 2021-06-07 NOTE — Telephone Encounter (Signed)
Needs PCP to review/refill if appropriate

## 2021-06-13 DIAGNOSIS — G20 Parkinson's disease: Secondary | ICD-10-CM | POA: Diagnosis not present

## 2021-06-13 DIAGNOSIS — N400 Enlarged prostate without lower urinary tract symptoms: Secondary | ICD-10-CM | POA: Diagnosis not present

## 2021-06-13 DIAGNOSIS — I10 Essential (primary) hypertension: Secondary | ICD-10-CM | POA: Diagnosis not present

## 2021-06-19 DIAGNOSIS — R338 Other retention of urine: Secondary | ICD-10-CM | POA: Diagnosis not present

## 2021-06-28 DIAGNOSIS — Z978 Presence of other specified devices: Secondary | ICD-10-CM | POA: Diagnosis not present

## 2021-06-28 DIAGNOSIS — T148XXA Other injury of unspecified body region, initial encounter: Secondary | ICD-10-CM | POA: Diagnosis not present

## 2021-06-30 ENCOUNTER — Encounter (HOSPITAL_COMMUNITY): Payer: Self-pay

## 2021-06-30 ENCOUNTER — Other Ambulatory Visit: Payer: Self-pay

## 2021-06-30 ENCOUNTER — Emergency Department (HOSPITAL_COMMUNITY)
Admission: EM | Admit: 2021-06-30 | Discharge: 2021-06-30 | Disposition: A | Discharge status: Home / Self Care | Payer: PPO | Attending: Emergency Medicine | Admitting: Emergency Medicine

## 2021-06-30 ENCOUNTER — Emergency Department (HOSPITAL_BASED_OUTPATIENT_CLINIC_OR_DEPARTMENT_OTHER)
Admit: 2021-06-30 | Discharge: 2021-06-30 | Disposition: A | Discharge status: Home / Self Care | Payer: PPO | Attending: Emergency Medicine | Admitting: Emergency Medicine

## 2021-06-30 DIAGNOSIS — M7989 Other specified soft tissue disorders: Secondary | ICD-10-CM

## 2021-06-30 DIAGNOSIS — Z87891 Personal history of nicotine dependence: Secondary | ICD-10-CM | POA: Insufficient documentation

## 2021-06-30 DIAGNOSIS — R600 Localized edema: Secondary | ICD-10-CM | POA: Insufficient documentation

## 2021-06-30 DIAGNOSIS — Z79899 Other long term (current) drug therapy: Secondary | ICD-10-CM | POA: Diagnosis not present

## 2021-06-30 DIAGNOSIS — G20 Parkinson's disease: Secondary | ICD-10-CM | POA: Insufficient documentation

## 2021-06-30 DIAGNOSIS — R2241 Localized swelling, mass and lump, right lower limb: Secondary | ICD-10-CM | POA: Diagnosis not present

## 2021-06-30 DIAGNOSIS — I10 Essential (primary) hypertension: Secondary | ICD-10-CM | POA: Diagnosis not present

## 2021-06-30 LAB — CBC WITH DIFFERENTIAL/PLATELET
Abs Immature Granulocytes: 0.02 10*3/uL (ref 0.00–0.07)
Basophils Absolute: 0 10*3/uL (ref 0.0–0.1)
Basophils Relative: 1 %
Eosinophils Absolute: 0.3 10*3/uL (ref 0.0–0.5)
Eosinophils Relative: 5 %
HCT: 35 % — ABNORMAL LOW (ref 39.0–52.0)
Hemoglobin: 11.2 g/dL — ABNORMAL LOW (ref 13.0–17.0)
Immature Granulocytes: 0 %
Lymphocytes Relative: 12 %
Lymphs Abs: 0.8 10*3/uL (ref 0.7–4.0)
MCH: 30 pg (ref 26.0–34.0)
MCHC: 32 g/dL (ref 30.0–36.0)
MCV: 93.8 fL (ref 80.0–100.0)
Monocytes Absolute: 0.6 10*3/uL (ref 0.1–1.0)
Monocytes Relative: 9 %
Neutro Abs: 4.8 10*3/uL (ref 1.7–7.7)
Neutrophils Relative %: 73 %
Platelets: 217 10*3/uL (ref 150–400)
RBC: 3.73 MIL/uL — ABNORMAL LOW (ref 4.22–5.81)
RDW: 13.2 % (ref 11.5–15.5)
WBC: 6.6 10*3/uL (ref 4.0–10.5)
nRBC: 0 % (ref 0.0–0.2)

## 2021-06-30 LAB — BASIC METABOLIC PANEL
Anion gap: 8 (ref 5–15)
BUN: 19 mg/dL (ref 8–23)
CO2: 29 mmol/L (ref 22–32)
Calcium: 9.5 mg/dL (ref 8.9–10.3)
Chloride: 103 mmol/L (ref 98–111)
Creatinine, Ser: 0.79 mg/dL (ref 0.61–1.24)
GFR, Estimated: >60 mL/min (ref >60)
Glucose, Bld: 97 mg/dL (ref 70–99)
Potassium: 3.8 mmol/L (ref 3.5–5.1)
Sodium: 140 mmol/L (ref 135–145)

## 2021-06-30 NOTE — ED Notes (Signed)
Vascular at bedside

## 2021-06-30 NOTE — Discharge Instructions (Addendum)
We recommend follow-up with vascular surgery team in the office for further evaluation of the leg swelling. Call to make an appointment.  We also recommend follow-up with a cardiologist to evaluate the status of the heart to assure the swelling is not from heart dysfunction.  Return to the emergency department for chest pain, shortness of breath, pain in the extremity, numbness, weakness, or any other major concerns.

## 2021-06-30 NOTE — ED Provider Notes (Signed)
[facility] DEPT Provider Note   CSN: [id number] Arrival date & time: 06/30/21  1508     History Chief Complaint  Patient presents with   Leg Swelling    Jacob Bolton is a 80 y.o. male.  HPI     Jacob Bolton is a 80 y.o. male, with a history of HTN, Parkinson's, presenting to the ED with concern over swelling to the right lower extremity. There is some debate and discussion amongst the patient and his wife as to when the swelling actually started and they both give different answers at different points during the interview.  I think I was able to clarify the discussion a little bit and determine that he has had bilateral lower extremity edema for several months, but it was noted a change over the last few days of increased swelling in the right lower extremity. They state they were not really concerned or bothered by it until the patient went to his PCP today to have his Foley catheter changed out and the provider noted the swelling to the right leg.  Denies fever/chills, leg pain, numbness, weakness, chest pain, shortness of breath, abdominal pain, orthopnea, or any other complaints.  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   Memory disorder 09/30/2019   Deep venous thrombosis ([place]) 12/16/2018   Lipoma of right lower extremity 05/20/2017   Parkinson disease ([place]) 12/03/2016   BPH (benign prostatic hyperplasia) 06/16/2013   HTN (hypertension) 06/16/2013   Obesity (BMI 30-39.9) 06/16/2013   Sarcoma of lower extremity, 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 Leonides Schanz, [facility]       Family History  Problem Relation Age of Onset   High blood pressure Mother    Anxiety disorder Mother    High blood pressure Father    Heart attack Father     Social History   Tobacco Use   Smoking status: Former    Pack years: 0.00   Smokeless tobacco: Never  Substance Use Topics   Alcohol use: Yes    Alcohol/week: 3.0 standard drinks    Types: 3 Glasses of wine per week    Comment: WIne 2-3 nights per week   Drug use: No    Home Medications Prior to Admission medications   Medication Sig Start Date End Date Taking? Authorizing Provider  acetaminophen (TYLENOL) 500 MG tablet Take 500 mg by mouth every 6 (six) hours as needed for moderate pain.    [provider]  Azelastine HCl 0.15 % SOLN Place 1 spray into the nose in the morning and at bedtime. 11/11/16   [provider]  carbidopa-levodopa (SINEMET) 25-250 MG tablet Take 1 tablet by mouth 3 (three) times daily. 12/04/20   Kathrynn Ducking, MD  Cholecalciferol 25 MCG (1000 UT) tablet Take 1,000 Units by mouth daily.    [provider]  donepezil (ARICEPT) 5 MG tablet Take 1 tablet (5 mg total) by mouth daily after breakfast. 05/10/21   Kathrynn Ducking, MD  fluticasone [facility]) 50 MCG/ACT nasal spray Place 1 spray into both nostrils in the morning and at bedtime. 10/28/16   [provider]  losartan-hydrochlorothiazide (HYZAAR) 100-12.5 MG tablet Take 1 tablet by mouth daily.    [provider]  polyethylene glycol (MIRALAX / GLYCOLAX) 17 g packet Take 17 g by mouth daily as needed for mild constipation or moderate constipation. Also available over-the-counter 03/12/21   Rai,  Vernelle Emerald, MD  pramipexole (MIRAPEX) 1 MG tablet TAKE 1 TABLET BY MOUTH THREE TIMES A DAY Patient taking differently: Take 1 mg by mouth 3 (three) times daily. 04/27/21   Kathrynn Ducking, MD  terazosin (HYTRIN) 10 MG capsule Take 10 mg by mouth daily. 04/28/21   [provider]    Allergies    Donepezil and Sulfamethoxazole  Review of Systems   Review of Systems  Constitutional:  Negative for chills, diaphoresis and fever.  Respiratory:  Negative for cough and shortness of breath.   Cardiovascular:  Positive for leg swelling. Negative for chest pain.  Gastrointestinal:  Negative for abdominal pain.  Musculoskeletal:  Negative for back pain.  Neurological:  Negative for weakness and numbness.  All other systems reviewed and are negative.  Physical Exam Updated Vital Signs BP (!) 146/82 (BP Location: Left Arm)   Pulse 76   Temp 98.3 F (36.8 C) (Oral)   Resp 18   SpO2 100%   Physical Exam Vitals and nursing note reviewed.  Constitutional:      General: He is not in acute distress.    Appearance: He is well-developed. He is not diaphoretic.  HENT:     Head: Normocephalic and atraumatic.     Mouth/Throat:     Mouth: Mucous membranes are moist.     Pharynx: Oropharynx is clear.  Eyes:     Conjunctiva/sclera: Conjunctivae normal.  Cardiovascular:     Rate and Rhythm: Normal rate and regular rhythm.     Pulses: Normal pulses.          Radial pulses are 2+ on the right side and 2+ on the left side.       Dorsalis pedis pulses are 2+ on the right side and 2+ on the left side.       Posterior tibial pulses are 2+ on the right side and 2+ on the left side.     Heart sounds: Normal heart sounds.     Comments: Tactile temperature in the extremities appropriate and equal bilaterally. No orthopnea. Pulmonary:     Effort: Pulmonary effort is normal. No respiratory distress.     Breath sounds: Normal breath sounds.  Abdominal:     Palpations: Abdomen is soft.      Tenderness: There is no abdominal tenderness. There is no guarding.  Musculoskeletal:     Cervical back: Neck supple.     Right lower leg: 2+ Pitting Edema present.     Left lower leg: 1+ Pitting Edema present.     Comments: Bilateral lower extremity edema, right worse than left. There is some slightly more erythematous presentation of the right lower extremity than the left, however, I did not note tenderness anywhere in the right lower extremity nor does there seem to be increased warmth.  Skin:  General: Skin is warm and dry.  Neurological:     Mental Status: He is alert.     Comments: Sensation light touch grossly intact in the bilateral lower extremities. Strength 5/5 in the bilateral lower extremities.  Psychiatric:        Mood and Affect: Mood and affect normal.        Speech: Speech normal.        Behavior: Behavior normal.              ED Results / Procedures / Treatments   Labs (all labs ordered are listed, but only abnormal results are displayed) Labs Reviewed  CBC WITH DIFFERENTIAL/PLATELET - Abnormal; Notable for the following components:      Result Value   RBC 3.73 (*)    Hemoglobin 11.2 (*)    HCT 35.0 (*)    All other components within normal limits  BASIC METABOLIC PANEL    EKG None  Radiology VAS [place] LOWER EXTREMITY VENOUS (DVT) (ONLY [facility] & [facility] 7a-7p)  Result Date: 06/30/2021  Lower Venous DVT Study Patient Name:  Siddh WILLIARD KELLER  Date of Exam:   06/30/2021 Medical Rec #: [id number]      Accession #:    [id number] Date of Birth: [birth year]      Patient Gender: M Patient Age:   80Y Exam [facility]  [facility] Procedure:      VAS [place] LOWER EXTREMITY VENOUS (DVT) Referring Phys: [id number] Helane Gunther Bijal [place] --------------------------------------------------------------------------------  Indications: Swelling.  Risk Factors: None identified. Limitations: Poor ultrasound/tissue interface and body habitus. Comparison Study: No prior studies. Performing  Technologist: Oliver Hum RVT  Examination Guidelines: A complete evaluation includes B-mode imaging, spectral Doppler, color Doppler, and power Doppler as needed of all accessible portions of each vessel. Bilateral testing is considered an integral part of a complete examination. Limited examinations for reoccurring indications may be performed as noted. The reflux portion of the exam is performed with the patient in reverse Trendelenburg.  +---------+---------------+---------+-----------+----------+--------------+ RIGHT    CompressibilityPhasicitySpontaneityPropertiesThrombus Aging +---------+---------------+---------+-----------+----------+--------------+ CFV      Full           Yes      Yes                                 +---------+---------------+---------+-----------+----------+--------------+ SFJ      Full                                                        +---------+---------------+---------+-----------+----------+--------------+ FV Prox  Full                                                        +---------+---------------+---------+-----------+----------+--------------+ FV Mid                  No       No                                  +---------+---------------+---------+-----------+----------+--------------+ FV Distal  No       No                                  +---------+---------------+---------+-----------+----------+--------------+ PFV      Full                                                        +---------+---------------+---------+-----------+----------+--------------+ POP      Full           Yes      Yes                                 +---------+---------------+---------+-----------+----------+--------------+ PTV      Full                                                        +---------+---------------+---------+-----------+----------+--------------+ PERO     Full                                                         +---------+---------------+---------+-----------+----------+--------------+   +----+---------------+---------+-----------+----------+--------------+ LEFTCompressibilityPhasicitySpontaneityPropertiesThrombus Aging +----+---------------+---------+-----------+----------+--------------+ CFV Full           Yes      Yes                                 +----+---------------+---------+-----------+----------+--------------+    Summary: RIGHT: - There is no evidence of deep vein thrombosis in the lower extremity. However, portions of this examination were limited- see technologist comments above.  - No cystic structure found in the popliteal fossa.  LEFT: - No evidence of common femoral vein obstruction.  *See table(s) above for measurements and observations.    Preliminary     Procedures Procedures   Medications Ordered in ED Medications - No data to display  ED Course  I have reviewed the triage vital signs and the nursing notes.  Pertinent labs & imaging results that were available during my care of the patient were reviewed by me and considered in my medical decision making (see chart for details).    MDM Rules/Calculators/A&P                          Patient presents with lower extremity edema. Patient is nontoxic appearing, afebrile, not tachycardic, not tachypneic, not hypotensive, maintains excellent SPO2 on room air, and is in no apparent distress.   I have reviewed the patient's chart to obtain more information.   I reviewed and interpreted the patient's labs and imaging studies. Ultrasound without acute abnormality.  Lab work reassuring. My suspicion for acute cellulitis is low given the patient's unremarkable vital signs, lack of leukocytosis, lack of tenderness, lack of demarcation of his erythema, and the overall pattern of the erythema. We will have the patient follow-up with  vascular surgery and cardiology for further assessment.  Findings and plan  of care discussed with attending physician, Dene Gentry, MD.      Final Clinical Impression(s) / ED Diagnoses Final diagnoses:  Leg edema    Rx / DC Orders ED Discharge Orders     None        Layla Maw 06/30/21 1851    Valarie Merino, MD 06/30/21 2352

## 2021-06-30 NOTE — ED Triage Notes (Signed)
Pt reports right leg swelling for a few days. Pt denies pain. Pt sent here to rule out DVT by PCP.

## 2021-06-30 NOTE — Progress Notes (Signed)
Right lower extremity venous duplex has been completed. Preliminary results can be found in CV Proc through chart review.  Results were given to Arlean Hopping PA.  06/30/21 4:50 PM Carlos Levering RVT

## 2021-07-11 DIAGNOSIS — R338 Other retention of urine: Secondary | ICD-10-CM | POA: Diagnosis not present

## 2021-07-13 ENCOUNTER — Other Ambulatory Visit: Payer: Self-pay | Admitting: Neurology

## 2021-07-13 ENCOUNTER — Other Ambulatory Visit: Payer: Self-pay | Admitting: Physical Medicine and Rehabilitation

## 2021-07-16 NOTE — Telephone Encounter (Signed)
Please advise 

## 2021-07-18 DIAGNOSIS — R338 Other retention of urine: Secondary | ICD-10-CM | POA: Diagnosis not present

## 2021-08-01 ENCOUNTER — Other Ambulatory Visit: Payer: Self-pay

## 2021-08-01 DIAGNOSIS — I872 Venous insufficiency (chronic) (peripheral): Secondary | ICD-10-CM

## 2021-08-11 DIAGNOSIS — N400 Enlarged prostate without lower urinary tract symptoms: Secondary | ICD-10-CM | POA: Diagnosis not present

## 2021-08-11 DIAGNOSIS — I10 Essential (primary) hypertension: Secondary | ICD-10-CM | POA: Diagnosis not present

## 2021-08-11 DIAGNOSIS — G20 Parkinson's disease: Secondary | ICD-10-CM | POA: Diagnosis not present

## 2021-08-20 ENCOUNTER — Encounter (HOSPITAL_COMMUNITY): Payer: PPO

## 2021-08-21 DIAGNOSIS — R338 Other retention of urine: Secondary | ICD-10-CM | POA: Diagnosis not present

## 2021-09-04 ENCOUNTER — Other Ambulatory Visit: Payer: Self-pay | Admitting: Physical Medicine and Rehabilitation

## 2021-09-04 ENCOUNTER — Other Ambulatory Visit: Payer: Self-pay | Admitting: Neurology

## 2021-09-05 NOTE — Telephone Encounter (Signed)
Please advise 

## 2021-09-11 DIAGNOSIS — Z6828 Body mass index (BMI) 28.0-28.9, adult: Secondary | ICD-10-CM | POA: Diagnosis not present

## 2021-09-11 DIAGNOSIS — M79604 Pain in right leg: Secondary | ICD-10-CM | POA: Diagnosis not present

## 2021-09-11 DIAGNOSIS — C499 Malignant neoplasm of connective and soft tissue, unspecified: Secondary | ICD-10-CM | POA: Diagnosis not present

## 2021-09-11 DIAGNOSIS — C4921 Malignant neoplasm of connective and soft tissue of right lower limb, including hip: Secondary | ICD-10-CM | POA: Diagnosis not present

## 2021-09-17 DIAGNOSIS — Z961 Presence of intraocular lens: Secondary | ICD-10-CM | POA: Diagnosis not present

## 2021-09-17 DIAGNOSIS — H25012 Cortical age-related cataract, left eye: Secondary | ICD-10-CM | POA: Diagnosis not present

## 2021-09-17 DIAGNOSIS — H5201 Hypermetropia, right eye: Secondary | ICD-10-CM | POA: Diagnosis not present

## 2021-09-17 DIAGNOSIS — H2511 Age-related nuclear cataract, right eye: Secondary | ICD-10-CM | POA: Diagnosis not present

## 2021-09-18 DIAGNOSIS — R338 Other retention of urine: Secondary | ICD-10-CM | POA: Diagnosis not present

## 2021-09-20 DIAGNOSIS — Z978 Presence of other specified devices: Secondary | ICD-10-CM | POA: Diagnosis not present

## 2021-09-20 DIAGNOSIS — R7303 Prediabetes: Secondary | ICD-10-CM | POA: Diagnosis not present

## 2021-09-20 DIAGNOSIS — D649 Anemia, unspecified: Secondary | ICD-10-CM | POA: Diagnosis not present

## 2021-09-20 DIAGNOSIS — N400 Enlarged prostate without lower urinary tract symptoms: Secondary | ICD-10-CM | POA: Diagnosis not present

## 2021-09-20 DIAGNOSIS — R413 Other amnesia: Secondary | ICD-10-CM | POA: Diagnosis not present

## 2021-09-20 DIAGNOSIS — G20 Parkinson's disease: Secondary | ICD-10-CM | POA: Diagnosis not present

## 2021-09-20 DIAGNOSIS — E782 Mixed hyperlipidemia: Secondary | ICD-10-CM | POA: Diagnosis not present

## 2021-09-20 DIAGNOSIS — I10 Essential (primary) hypertension: Secondary | ICD-10-CM | POA: Diagnosis not present

## 2021-09-20 DIAGNOSIS — G609 Hereditary and idiopathic neuropathy, unspecified: Secondary | ICD-10-CM | POA: Diagnosis not present

## 2021-09-20 DIAGNOSIS — Z0000 Encounter for general adult medical examination without abnormal findings: Secondary | ICD-10-CM | POA: Diagnosis not present

## 2021-09-20 DIAGNOSIS — D696 Thrombocytopenia, unspecified: Secondary | ICD-10-CM | POA: Diagnosis not present

## 2021-09-24 DIAGNOSIS — E782 Mixed hyperlipidemia: Secondary | ICD-10-CM | POA: Diagnosis not present

## 2021-09-24 DIAGNOSIS — D649 Anemia, unspecified: Secondary | ICD-10-CM | POA: Diagnosis not present

## 2021-09-24 DIAGNOSIS — I10 Essential (primary) hypertension: Secondary | ICD-10-CM | POA: Diagnosis not present

## 2021-09-24 DIAGNOSIS — Z0000 Encounter for general adult medical examination without abnormal findings: Secondary | ICD-10-CM | POA: Diagnosis not present

## 2021-09-24 DIAGNOSIS — R7303 Prediabetes: Secondary | ICD-10-CM | POA: Diagnosis not present

## 2021-09-26 ENCOUNTER — Other Ambulatory Visit: Payer: Self-pay

## 2021-09-26 DIAGNOSIS — I872 Venous insufficiency (chronic) (peripheral): Secondary | ICD-10-CM

## 2021-09-26 NOTE — Progress Notes (Signed)
Error

## 2021-10-04 ENCOUNTER — Ambulatory Visit (HOSPITAL_COMMUNITY)
Admission: RE | Admit: 2021-10-04 | Discharge: 2021-10-04 | Disposition: A | Discharge status: Home / Self Care | Payer: PPO | Source: Ambulatory Visit | Attending: Vascular Surgery | Admitting: Vascular Surgery

## 2021-10-04 ENCOUNTER — Other Ambulatory Visit: Payer: Self-pay

## 2021-10-04 ENCOUNTER — Encounter: Payer: Self-pay | Admitting: Physician Assistant

## 2021-10-04 ENCOUNTER — Ambulatory Visit (INDEPENDENT_AMBULATORY_CARE_PROVIDER_SITE_OTHER): Payer: PPO | Admitting: Physician Assistant

## 2021-10-04 VITALS — BP 149/84 | HR 76 | Temp 97.8°F | Resp 18 | Ht 71.0 in | Wt 205.0 lb

## 2021-10-04 DIAGNOSIS — M7989 Other specified soft tissue disorders: Secondary | ICD-10-CM

## 2021-10-04 DIAGNOSIS — I872 Venous insufficiency (chronic) (peripheral): Secondary | ICD-10-CM

## 2021-10-04 NOTE — Progress Notes (Signed)
[facility] [place]   Reason for referral: Swollen left leg  History of Present Illness  Jacob Bolton is a 80 y.o. male who presents with chief complaint: swollen leg.  Patient notes, onset of swelling several months ago, associated with left leg tumor removal surgeries that are benign.  The patient has had no history of DVT, positive minimal history of varicose vein, no history of venous stasis ulcers, no history of  Lymphedema and positive history  brawny of skin changes in lower legs.   The patient has not used compression stockings in the past.  He states the leg is heavy and he is unable to move much secondary to Parkinson.  He denise weeping or non healing wounds.    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   Parkinson disease ([place]) 12/03/2016    Past Surgical History:  Procedure Laterality Date   CERVICAL DISC SURGERY  2007   Ruptured disc, plates and screws   COLONOSCOPY  2008   DEEP THIGH / KNEE TUMOR EXCISION Right 2007, 2009, 2014   Dr. Leonides Schanz, [facility]    Social History   Socioeconomic History   Marital status: Married    Spouse name: Arbie Cookey   Number of children: 3   Years of education: 16   Highest education level: Not on file  Occupational History   Occupation: Retired  Tobacco Use   Smoking status: Former   Smokeless tobacco: Never  Substance and Sexual Activity   Alcohol use: Yes    Alcohol/week: 3.0 standard drinks    Types: 3 Glasses of wine per week    Comment: WIne 2-3 nights per week   Drug use: No   Sexual activity: Not on file  Other Topics Concern   Not on file  Social History Narrative   Lives at home w/ his wife   Right-handed   Drinks no caffeine   Social Determinants of Health   Financial Resource Strain: Not on file  Food Insecurity: Not on file  Transportation Needs: Not on file  Physical Activity: Not on file  Stress: Not on file  Social Connections:  Not on file  Intimate Partner Violence: Not on file    Family History  Problem Relation Age of Onset   High blood pressure Mother    Anxiety disorder Mother    High blood pressure Father    Heart attack Father     Current Outpatient Medications on File Prior to Visit  Medication Sig Dispense Refill   Azelastine HCl 0.15 % SOLN Place 1 spray into the nose in the morning and at bedtime.     carbidopa-levodopa (SINEMET) 25-250 MG tablet Take 1 tablet by mouth 3 (three) times daily. 270 tablet 3   donepezil (ARICEPT) 5 MG tablet Take 1 tablet (5 mg total) by mouth daily after breakfast. 90 tablet 3   fluticasone (FLONASE) 50 MCG/ACT nasal spray Place 1 spray into both nostrils in the morning and at bedtime.     losartan-hydrochlorothiazide (HYZAAR) 100-12.5 MG tablet Take 1 tablet by mouth daily.     pramipexole (MIRAPEX) 1 MG tablet TAKE 1 TABLET BY MOUTH THREE TIMES A DAY (Patient taking differently: Take 1 mg by mouth 3 (three) times daily.) 270 tablet 1   terazosin (HYTRIN) 10 MG capsule Take 10 mg by mouth daily.     acetaminophen (TYLENOL) 500 MG tablet Take 500 mg by mouth every 6 (  six) hours as needed for moderate pain.     Cholecalciferol 25 MCG (1000 UT) tablet Take 1,000 Units by mouth daily.     polyethylene glycol (MIRALAX / GLYCOLAX) 17 g packet Take 17 g by mouth daily as needed for mild constipation or moderate constipation. Also available over-the-counter 30 each 0   No current facility-administered medications on file prior to visit.    Allergies as of 10/04/2021 - Review Complete 10/04/2021  Allergen Reaction Noted   Donepezil Other (See Comments) 02/22/2021   Sulfamethoxazole  06/02/2012     ROS:   General:  No weight loss, Fever, chills  HEENT: No recent headaches, no nasal bleeding, no visual changes, no sore throat  Neurologic: No dizziness, blackouts, seizures. No recent symptoms of stroke or mini- stroke. No recent episodes of slurred speech, or temporary  blindness.  Cardiac: No recent episodes of chest pain/pressure, no shortness of breath at rest.  No shortness of breath with exertion.  Denies history of atrial fibrillation or irregular heartbeat  Vascular: No history of rest pain in feet.  No history of claudication.  No history of non-healing ulcer, No history of DVT   Pulmonary: No home oxygen, no productive cough, no hemoptysis,  No asthma or wheezing  Musculoskeletal:  [ ]  Arthritis, [ ]  Low back pain,  [ ]  Joint pain [x]  left LE edema  Hematologic:No history of hypercoagulable state.  No history of easy bleeding.  No history of anemia  Gastrointestinal: No hematochezia or melena,  No gastroesophageal reflux, no trouble swallowing  Urinary: [ ]  chronic Kidney disease, [ ]  on HD - [ ]  MWF or [ ]  TTHS, [ ]  Burning with urination, [ ]  Frequent urination, [ ]  Difficulty urinating;   Skin: No rashes  Psychological: No history of anxiety,  No history of depression  Physical Examination  Vitals:   10/04/21 1350  BP: (!) 149/84  Pulse: 76  Resp: 18  Temp: 97.8 F (36.6 C)  TempSrc: Temporal  SpO2: 100%  Weight: 205 lb (93 kg)  Height: 5\' 11"  (1.803 m)    Body mass index is 28.59 kg/m.  General:  Alert and oriented, no acute distress HEENT: Normal Neck: No bruit or JVD Pulmonary: Clear to auscultation bilaterally Cardiac: Regular Rate and Rhythm without murmur Abdomen: Soft, non-tender, non-distended, no mass, no scars Skin: No rash     Extremity Pulses:  2+ radial, brachial, femoral, brisk biphasic doppler signals dorsalis pedis, posterior tibial  bilaterally Musculoskeletal: No deformity or edema  Neurologic: Upper and lower extremity motor 5/5 and symmetric  DATA:    Venous Reflux Times  +--------------+---------+------+-----------+------------+-----------------  ----+  RIGHT         Reflux NoRefluxReflux TimeDiameter cmsComments                                        Yes                                                  +--------------+---------+------+-----------+------------+-----------------  ----+  CFV                     yes   >1 second                                     +--------------+---------+------+-----------+------------+-----------------  ----+  FV prox                 yes   >1 second             Continuous  refluxing                                                       flow with normal  flow                                                      with distal                                                                  augmentation            +--------------+---------+------+-----------+------------+-----------------  ----+  FV dist                 yes   >1 second                                     +--------------+---------+------+-----------+------------+-----------------  ----+  GSV at [facility]    no                           0.664                            +--------------+---------+------+-----------+------------+-----------------  ----+  GSV prox thighno                            0.67                            +--------------+---------+------+-----------+------------+-----------------  ----+  GSV mid thigh no                           0.651                            +--------------+---------+------+-----------+------------+-----------------  ----+  GSV dist thighno                           0.627                            +--------------+---------+------+-----------+------------+-----------------  ----+  GSV at knee   no                            0.56                            +--------------+---------+------+-----------+------------+-----------------  ----+  GSV prox calf no                           0.426                            +--------------+---------+------+-----------+------------+-----------------  ----+  SSV prox calf           yes    >500  ms     0.365                            +--------------+---------+------+-----------+------------+-----------------  ----+  SSV mid calf            yes    >500 ms      0.45    Continuous  refluxing                                                       flow with normal  flow                                                      pattern with  distal                                                        augmentation            +--------------+---------+------+-----------+------------+-----------------  ----+      Summary:  Right:  - No evidence of deep vein thrombosis seen in the right lower extremity,  from the common femoral through the popliteal veins.  - No evidence of superficial venous thrombosis in the right lower  extremity.  - No evidence of superficial venous reflux seen in the right greater  saphenous vein.  - No evidence of superficial venous reflux seen in the right short  saphenous vein.  - Venous reflux is noted in the right common femoral vein.  - Venous reflux is noted in the right femoral vein.  - Venous reflux is noted in the right popliteal vein.     Assessment/Plan: Deep venous reflux with SS reflux He does not have SFJ reflux or any GSV below the junction.  The deep system can not be intervened on.  He has brisk doppler signals so he is not at risk of limb loss.  He will be fitted with mild compression B LE.  Elevation when at rest and exercise as tolerates.  He did buy a recumbent Bike for home use.  If he develops non healing wounds, increased edema or weeping of the skin he will call for a f/u.  The main goal is to prevent further skin damage with compression and elevation.      Roxy Horseman PA-C [facility] [place] Office: [phone]  MD in clinic [place]

## 2021-10-09 DIAGNOSIS — C4921 Malignant neoplasm of connective and soft tissue of right lower limb, including hip: Secondary | ICD-10-CM | POA: Diagnosis not present

## 2021-10-17 DIAGNOSIS — R338 Other retention of urine: Secondary | ICD-10-CM | POA: Diagnosis not present

## 2021-10-25 ENCOUNTER — Ambulatory Visit: Payer: PPO | Admitting: Adult Health

## 2021-10-25 ENCOUNTER — Encounter: Payer: Self-pay | Admitting: Adult Health

## 2021-10-25 VITALS — BP 121/75 | HR 81 | Ht 71.0 in | Wt 209.0 lb

## 2021-10-25 DIAGNOSIS — R413 Other amnesia: Secondary | ICD-10-CM

## 2021-10-25 DIAGNOSIS — G20 Parkinson's disease: Secondary | ICD-10-CM | POA: Diagnosis not present

## 2021-10-25 NOTE — Patient Instructions (Signed)
Continue Sinmet and mirapex at current dose   Continue aricept 5mg  daily      Followup in the future with me in 6 months with Dr. Rexene Alberts or call earlier if needed       Thank you for coming to see [place] at [facility]. I hope we have been able to provide you high quality care today.  You may receive a patient satisfaction survey over the next few weeks. We would appreciate your feedback and comments so that we may continue to improve ourselves and the health of our patients.

## 2021-10-25 NOTE — Progress Notes (Signed)
Reason for visit: Parkinson's disease, memory disturbance  Jacob Bolton is an 80 y.o. male  History of present illness:  Update 10/25/2021 JM: Returns for 30-month follow-up regarding Parkinson's disease and memory disturbance. He was supposed to be scheduled with Dr. Rexene Alberts to establish care (as Dr. Jannifer Franklin retiring) but was not scheduled for unknown reason. Patient reports he has been relatively stable since prior visit - denies worsening of Parkinson's symptoms.  Denies tremor, dysphagia or drooling. Gait stable without worsening - per wife, "slow gait, difficulty standing from seated position" but this is not new. Does not use assistive device.  Denies any recent falls. Reports at times difficulty sleeping with nightmares and increased right leg pain with known soft tissue sarcoma right thigh being followed by orthopedics. Wife believes gradual worsening of cognition.  MMSE today 22/30 (prior 23/30). Remains on Aricept.  Limited daytime activity or exercise due to impaired gait and right leg pain. No memory exercises due to decreased vision.   Upon further discussion of medications, wife reports giving the round blue pill (aricept) and white round pill (Mirapex) three times daily and blue oval pill (Sinemet) once daily. She does not know the pills by name only by color and shape. She was advised of recommended medication regimen and she will verify this when she returns home.  No further concerns at this time    History provided for reference purposes only Update 05/10/2021 Dr. Jannifer Franklin: Jacob Bolton is a 80 year old right-handed white male with a history of Parkinson's disease associated with a memory disorder.  The patient was recently in the hospital on 01 May 2021 with a urinary tract infection.  The patient has had an indwelling catheter since February 2022.  He just finished physical therapy following a previous bladder infection in mid April 2022.  He has had worsening of his parkinsonian  symptoms since the most recent hospitalization but he is gradually improving.  Initially, he was having to use a walker for ambulation but he is now walking independently.  He is on Sinemet taking the 25/250 mg tablets, he takes 1 tablet 3 times daily.  He is on Mirapex taking 1 mg 3 times daily.  He has not had any falls.  He does have some increased confusion in the evening hours.  Overall he continues to have some problems with memory.  He returns for an evaluation.  He did have 1 fall about a month ago, he landed on his knees when he was trying to run up a flight of stairs.  The patient reports some occasional vivid dreams.  He takes Aricept in the morning as it caused more troubles with dreams and evening.        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   Parkinson disease ([place]) 12/03/2016    Past Surgical History:  Procedure Laterality Date   CERVICAL DISC SURGERY  2007   Ruptured disc, plates and screws   COLONOSCOPY  2008   DEEP THIGH / KNEE TUMOR EXCISION Right 2007, 2009, 2014   Dr. Leonides Schanz, [facility]    Family History  Problem Relation Age of Onset   High blood pressure Mother    Anxiety disorder Mother    High blood pressure Father    Heart attack Father     Social history:  reports that he has quit smoking. He has never used smokeless tobacco. He reports current alcohol use of about 3.0  standard drinks per week. He reports that he does not use drugs.    Allergies  Allergen Reactions   Donepezil Other (See Comments)    nightmares   Sulfamethoxazole     Other reaction(s): Other (See Comments) Childhood allergy    Medications:  Prior to Admission medications   Medication Sig Start Date End Date Taking? Authorizing Provider  acetaminophen (TYLENOL) 500 MG tablet Take 500 mg by mouth every 6 (six) hours as needed for moderate pain.   Yes [provider]  Azelastine HCl 0.15 % SOLN Place 1 spray into  the nose in the morning and at bedtime. 11/11/16  Yes [provider]  carbidopa-levodopa (SINEMET) 25-250 MG tablet Take 1 tablet by mouth 3 (three) times daily. 12/04/20  Yes Kathrynn Ducking, MD  Cholecalciferol 25 MCG (1000 UT) tablet Take 1,000 Units by mouth daily.   Yes [provider]  donepezil (ARICEPT) 5 MG tablet Take 1 tablet (5 mg total) by mouth daily after breakfast. Patient taking differently: Take 5 mg by mouth in the morning. 02/23/21  Yes Kathrynn Ducking, MD  fluticasone [facility]) 50 MCG/ACT nasal spray Place 1 spray into both nostrils in the morning and at bedtime. 10/28/16  Yes [provider]  losartan-hydrochlorothiazide (HYZAAR) 100-12.5 MG tablet Take 1 tablet by mouth daily.   Yes [provider]  polyethylene glycol (MIRALAX / GLYCOLAX) 17 g packet Take 17 g by mouth daily as needed for mild constipation or moderate constipation. Also available over-the-counter 03/12/21  Yes Rai, Ripudeep K, MD  pramipexole (MIRAPEX) 1 MG tablet TAKE 1 TABLET BY MOUTH THREE TIMES A DAY Patient taking differently: Take 1 mg by mouth 3 (three) times daily. 04/27/21  Yes Kathrynn Ducking, MD  terazosin (HYTRIN) 10 MG capsule Take 10 mg by mouth daily. 04/28/21  Yes [provider]    ROS:  Out of a complete 14 system review of symptoms, the patient complains only of the following symptoms, and all other reviewed systems are negative.  Memory problems Walking difficulty Vivid dreams Right leg pain   Blood pressure 121/75, pulse 81, height 5\' 11"  (1.803 m), weight 209 lb (94.8 kg).  Physical Exam  General: The patient is alert and cooperative at the time of the examination.  Skin: No significant peripheral edema is noted.   Neurologic Exam  Mental status: The patient is alert and oriented to month and year and city at the time of the examination. (Stated today was Tuesday although it is Thursday and was unsure of date, did not know  name of office).  MMSE - Mini Mental State Exam 10/25/2021 05/10/2021 12/04/2020  Orientation to time 3 2 4   Orientation to Place 4 5 4   Registration 3 3 3   Attention/ Calculation 2 5 5   Attention/Calculation-comments - - -  Recall 2 1 3   Language- name 2 objects 2 2 2   Language- repeat 1 0 1  Language- follow 3 step command 3 2 3   Language- read & follow direction 1 1 1   Write a sentence 1 1 1   Copy design 0 1 1  Copy design-comments - - Named 8 four-legged animals.  Total score 22 23 28     Cranial nerves: Facial symmetry is present. Speech is normal, no aphasia or dysarthria is noted. Extraocular movements are full. Visual fields are full.  Masking the face is seen.  Motor: The patient has good strength in all 4 extremities.  Sensory examination: Soft touch sensation is symmetric  on the face, arms, and legs.  Coordination: The patient has good finger-nose-finger and heel-to-shin bilaterally.  Gait and station: The patient has difficulty arising from a seated position with arms crossed.  Once up, he can walk independently, decreased arm swing seen bilaterally and shortened shuffled steps.  No tremor is noted.  Tandem gait was not attempted.  Romberg is negative.  Reflexes: Deep tendon reflexes are symmetric.   Assessment/Plan:  1.  Parkinson's disease  2.  Memory disturbance   The patient will remain on his current dose of Sinemet and Mirapex although concern he is only receiving once daily per wife report - she will verify this when she returns home. It was discussed to ensure he is receiving Sinemet and Mirapex 3 times daily (seems she may have mixed up dosing of Aricept and Sinemet). He will remain on Aricept but again discussed taking 5mg  in the AM only and not 3 times daily. Continue to follow with ortho regarding right leg pain  Follow up in 6 months with Dr. Rexene Alberts (as previously planned) to establish care with   CC:  Kristen Loader, FNP   I spent 34 minutes of  face-to-face and non-face-to-face time with patient and wife.  This included previsit chart review, lab review, study review, electronic health record documentation, patient and wife education and discussion regarding known Parkinson's disease and ongoing use of Sinemet and Mirapex, memory disturbance with completion review of MMSE and ongoing use of Aricept, prolonged discussion regarding current dosage regimen for above medications and answered all questions to patient and wife satisfaction  Frann Rider, AGNP-BC  [facility] [address] [place], [place] [address]  Phone [phone] Fax [phone] Note: This document was prepared with digital dictation and possible smart phrase technology. Any transcriptional errors that result from this process are unintentional.

## 2021-11-09 ENCOUNTER — Telehealth: Payer: Self-pay | Admitting: Adult Health

## 2021-11-09 NOTE — Telephone Encounter (Signed)
Pt's wife called stating that the pt was given Xanax for him to get the MRI done. Wife stated they were still not able to do the MRI due to the pt still not being relaxed enough. Wife would like to be called back to be advised on what is the next step.

## 2021-11-10 ENCOUNTER — Inpatient Hospital Stay (HOSPITAL_COMMUNITY)
Admission: EM | Admit: 2021-11-10 | Discharge: 2021-11-13 | DRG: 699 | Disposition: A | Discharge status: Home Health | Payer: PPO | Attending: Internal Medicine | Admitting: Internal Medicine

## 2021-11-10 ENCOUNTER — Other Ambulatory Visit: Payer: Self-pay

## 2021-11-10 ENCOUNTER — Encounter (HOSPITAL_COMMUNITY): Payer: Self-pay

## 2021-11-10 ENCOUNTER — Emergency Department (HOSPITAL_BASED_OUTPATIENT_CLINIC_OR_DEPARTMENT_OTHER): Payer: PPO

## 2021-11-10 DIAGNOSIS — Z79899 Other long term (current) drug therapy: Secondary | ICD-10-CM

## 2021-11-10 DIAGNOSIS — Z20822 Contact with and (suspected) exposure to covid-19: Secondary | ICD-10-CM | POA: Diagnosis not present

## 2021-11-10 DIAGNOSIS — C4921 Malignant neoplasm of connective and soft tissue of right lower limb, including hip: Secondary | ICD-10-CM | POA: Insufficient documentation

## 2021-11-10 DIAGNOSIS — R600 Localized edema: Secondary | ICD-10-CM | POA: Diagnosis not present

## 2021-11-10 DIAGNOSIS — F40240 Claustrophobia: Secondary | ICD-10-CM | POA: Diagnosis present

## 2021-11-10 DIAGNOSIS — R609 Edema, unspecified: Secondary | ICD-10-CM | POA: Diagnosis not present

## 2021-11-10 DIAGNOSIS — L03115 Cellulitis of right lower limb: Secondary | ICD-10-CM | POA: Diagnosis not present

## 2021-11-10 DIAGNOSIS — Z882 Allergy status to sulfonamides status: Secondary | ICD-10-CM | POA: Diagnosis not present

## 2021-11-10 DIAGNOSIS — R531 Weakness: Secondary | ICD-10-CM

## 2021-11-10 DIAGNOSIS — G20 Parkinson's disease: Secondary | ICD-10-CM | POA: Diagnosis not present

## 2021-11-10 DIAGNOSIS — F419 Anxiety disorder, unspecified: Secondary | ICD-10-CM | POA: Diagnosis present

## 2021-11-10 DIAGNOSIS — L0390 Cellulitis, unspecified: Secondary | ICD-10-CM | POA: Diagnosis present

## 2021-11-10 DIAGNOSIS — Y846 Urinary catheterization as the cause of abnormal reaction of the patient, or of later complication, without mention of misadventure at the time of the procedure: Secondary | ICD-10-CM | POA: Diagnosis present

## 2021-11-10 DIAGNOSIS — Z818 Family history of other mental and behavioral disorders: Secondary | ICD-10-CM | POA: Diagnosis not present

## 2021-11-10 DIAGNOSIS — Z8249 Family history of ischemic heart disease and other diseases of the circulatory system: Secondary | ICD-10-CM

## 2021-11-10 DIAGNOSIS — R2241 Localized swelling, mass and lump, right lower limb: Secondary | ICD-10-CM | POA: Diagnosis not present

## 2021-11-10 DIAGNOSIS — K529 Noninfective gastroenteritis and colitis, unspecified: Secondary | ICD-10-CM | POA: Diagnosis not present

## 2021-11-10 DIAGNOSIS — I10 Essential (primary) hypertension: Secondary | ICD-10-CM | POA: Diagnosis present

## 2021-11-10 DIAGNOSIS — N390 Urinary tract infection, site not specified: Secondary | ICD-10-CM | POA: Diagnosis not present

## 2021-11-10 DIAGNOSIS — T83511A Infection and inflammatory reaction due to indwelling urethral catheter, initial encounter: Secondary | ICD-10-CM | POA: Diagnosis not present

## 2021-11-10 DIAGNOSIS — B961 Klebsiella pneumoniae [K. pneumoniae] as the cause of diseases classified elsewhere: Secondary | ICD-10-CM | POA: Diagnosis present

## 2021-11-10 DIAGNOSIS — Z888 Allergy status to other drugs, medicaments and biological substances status: Secondary | ICD-10-CM | POA: Diagnosis not present

## 2021-11-10 DIAGNOSIS — Z87891 Personal history of nicotine dependence: Secondary | ICD-10-CM | POA: Diagnosis not present

## 2021-11-10 DIAGNOSIS — C499 Malignant neoplasm of connective and soft tissue, unspecified: Secondary | ICD-10-CM

## 2021-11-10 DIAGNOSIS — R262 Difficulty in walking, not elsewhere classified: Secondary | ICD-10-CM | POA: Diagnosis not present

## 2021-11-10 LAB — URINALYSIS, ROUTINE W REFLEX MICROSCOPIC
Bilirubin Urine: NEGATIVE
Glucose, UA: NEGATIVE mg/dL
Hgb urine dipstick: NEGATIVE
Ketones, ur: 5 mg/dL — AB
Nitrite: POSITIVE — AB
Protein, ur: NEGATIVE mg/dL
Specific Gravity, Urine: 1.014 (ref 1.005–1.030)
WBC, UA: >50 WBC/hpf — ABNORMAL HIGH (ref 0–5)
pH: 5 (ref 5.0–8.0)

## 2021-11-10 LAB — CBC WITH DIFFERENTIAL/PLATELET
Abs Immature Granulocytes: 0.03 10*3/uL (ref 0.00–0.07)
Basophils Absolute: 0.1 10*3/uL (ref 0.0–0.1)
Basophils Relative: 1 %
Eosinophils Absolute: 1 10*3/uL — ABNORMAL HIGH (ref 0.0–0.5)
Eosinophils Relative: 12 %
HCT: 32.6 % — ABNORMAL LOW (ref 39.0–52.0)
Hemoglobin: 10.6 g/dL — ABNORMAL LOW (ref 13.0–17.0)
Immature Granulocytes: 0 %
Lymphocytes Relative: 9 %
Lymphs Abs: 0.7 10*3/uL (ref 0.7–4.0)
MCH: 29.4 pg (ref 26.0–34.0)
MCHC: 32.5 g/dL (ref 30.0–36.0)
MCV: 90.6 fL (ref 80.0–100.0)
Monocytes Absolute: 0.9 10*3/uL (ref 0.1–1.0)
Monocytes Relative: 10 %
Neutro Abs: 5.7 10*3/uL (ref 1.7–7.7)
Neutrophils Relative %: 68 %
Platelets: 180 10*3/uL (ref 150–400)
RBC: 3.6 MIL/uL — ABNORMAL LOW (ref 4.22–5.81)
RDW: 13.6 % (ref 11.5–15.5)
WBC: 8.4 10*3/uL (ref 4.0–10.5)
nRBC: 0 % (ref 0.0–0.2)

## 2021-11-10 LAB — BASIC METABOLIC PANEL
Anion gap: 9 (ref 5–15)
BUN: 19 mg/dL (ref 8–23)
CO2: 26 mmol/L (ref 22–32)
Calcium: 8.9 mg/dL (ref 8.9–10.3)
Chloride: 103 mmol/L (ref 98–111)
Creatinine, Ser: 0.85 mg/dL (ref 0.61–1.24)
GFR, Estimated: >60 mL/min (ref >60)
Glucose, Bld: 98 mg/dL (ref 70–99)
Potassium: 3.9 mmol/L (ref 3.5–5.1)
Sodium: 138 mmol/L (ref 135–145)

## 2021-11-10 LAB — RESP PANEL BY RT-PCR (FLU A&B, COVID) ARPGX2
Influenza A by PCR: NEGATIVE
Influenza B by PCR: NEGATIVE
SARS Coronavirus 2 by RT PCR: NEGATIVE

## 2021-11-10 MED ORDER — LORAZEPAM 0.5 MG PO TABS: 0.5000 mg | ORAL_TABLET | Freq: Once | ORAL | Status: DC | PRN

## 2021-11-10 MED ORDER — LOSARTAN POTASSIUM 50 MG PO TABS
100.0000 mg | ORAL_TABLET | Freq: Every day | ORAL | Status: DC
  Administered 2021-11-10 – 2021-11-12 (×3): 100 mg via ORAL
  Filled 2021-11-10 (×3): qty 2

## 2021-11-10 MED ORDER — SODIUM CHLORIDE 0.9 % IV SOLN
1.0000 g | INTRAVENOUS | Status: DC
  Administered 2021-11-11 – 2021-11-12 (×2): 1 g via INTRAVENOUS
  Filled 2021-11-10 (×2): qty 10

## 2021-11-10 MED ORDER — DONEPEZIL HCL 5 MG PO TABS
5.0000 mg | ORAL_TABLET | Freq: Every day | ORAL | Status: DC
  Administered 2021-11-11 – 2021-11-13 (×3): 5 mg via ORAL
  Filled 2021-11-10 (×3): qty 1

## 2021-11-10 MED ORDER — ACETAMINOPHEN 650 MG RE SUPP: 650.0000 mg | Freq: Four times a day (QID) | RECTAL | Status: DC | PRN

## 2021-11-10 MED ORDER — GABAPENTIN 300 MG PO CAPS
300.0000 mg | ORAL_CAPSULE | Freq: Three times a day (TID) | ORAL | Status: DC
  Administered 2021-11-10 – 2021-11-13 (×8): 300 mg via ORAL
  Filled 2021-11-10 (×8): qty 1

## 2021-11-10 MED ORDER — CARBIDOPA-LEVODOPA 25-250 MG PO TABS
1.0000 | ORAL_TABLET | Freq: Three times a day (TID) | ORAL | Status: DC
  Administered 2021-11-10 – 2021-11-13 (×8): 1 via ORAL
  Filled 2021-11-10 (×10): qty 1

## 2021-11-10 MED ORDER — SODIUM CHLORIDE 0.9 % IV SOLN
1.0000 g | Freq: Once | INTRAVENOUS | Status: AC
  Administered 2021-11-10: 1 g via INTRAVENOUS
  Filled 2021-11-10: qty 10

## 2021-11-10 MED ORDER — TERAZOSIN HCL 5 MG PO CAPS
10.0000 mg | ORAL_CAPSULE | Freq: Every day | ORAL | Status: DC
  Administered 2021-11-10 – 2021-11-12 (×3): 10 mg via ORAL
  Filled 2021-11-10 (×4): qty 2

## 2021-11-10 MED ORDER — PRAMIPEXOLE DIHYDROCHLORIDE 1 MG PO TABS
1.0000 mg | ORAL_TABLET | Freq: Three times a day (TID) | ORAL | Status: DC
  Administered 2021-11-10 – 2021-11-13 (×8): 1 mg via ORAL
  Filled 2021-11-10 (×10): qty 1

## 2021-11-10 MED ORDER — HYDROCHLOROTHIAZIDE 12.5 MG PO TABS
12.5000 mg | ORAL_TABLET | Freq: Every day | ORAL | Status: DC
  Administered 2021-11-10 – 2021-11-12 (×3): 12.5 mg via ORAL
  Filled 2021-11-10 (×3): qty 1

## 2021-11-10 MED ORDER — HEPARIN SODIUM (PORCINE) 5000 UNIT/ML IJ SOLN
5000.0000 [IU] | Freq: Three times a day (TID) | INTRAMUSCULAR | Status: DC
  Administered 2021-11-10 – 2021-11-13 (×9): 5000 [IU] via SUBCUTANEOUS
  Filled 2021-11-10 (×9): qty 1

## 2021-11-10 MED ORDER — ACETAMINOPHEN 325 MG PO TABS: 650.0000 mg | ORAL_TABLET | Freq: Four times a day (QID) | ORAL | Status: DC | PRN

## 2021-11-10 MED ORDER — LOSARTAN POTASSIUM-HCTZ 100-12.5 MG PO TABS: 1.0000 | ORAL_TABLET | Freq: Every day | ORAL | Status: DC

## 2021-11-10 NOTE — ED Triage Notes (Signed)
Pt states for the past 3 months he has been feeling off balance and dizzy.

## 2021-11-10 NOTE — ED Provider Notes (Signed)
[place] DEPT Provider Note   CSN: [id number] Arrival date & time: 11/10/21  1149   History Chief Complaint  Patient presents with   Dizziness   Weakness    Jacob Bolton is a 80 y.o. male with history of Parkinson's, DVT, lipoma sarcoma of the right extremity, chronic UTI presents emergency department for evaluation of right extremity swelling for the past 4 weeks.  Patient's last surgery treatment of treatment was 7 months ago.  Patient was seen by Dr. Redmond Pulling, has before surgeon, for the swelling.  Patient has an indwelling catheter has been placed in since February from his last surgery and is having to have it changed once per month.  The wife does not know the reason for the catheter placement, but he mentions he had it placed because he was having recurring UTIs.  He denies any numbness or tingling to the area. Reports weakness and has had to have family members help him ambulate to the car.  Denies unilateral weakness. Patient usually has shuffling gait per wife before weakness.  Wife denies any changes of mentation from patient's baseline. Surgeries include multiple thigh tumor excisions and extractions, cervical disc surgery.  Medication list up-to-date.  Allergic to Sulfa.   After reading triage nursing notes and MSE note, the patient and wife deny any dizziness, and state that he has just had incleased pain and swelling of the right leg.   Dizziness Associated symptoms: weakness   Associated symptoms: no chest pain, no palpitations, no shortness of breath and no vomiting   Weakness Associated symptoms: myalgias   Associated symptoms: no abdominal pain, no arthralgias, no chest pain, no cough, no dizziness, no dysuria, no fever, no seizures, no shortness of breath and no vomiting       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   Parkinson disease ([place]) 12/03/2016    Patient Active Problem List    Diagnosis Date Noted   Cellulitis 11/10/2021   UTI (urinary tract infection) 05/01/2021   AKI (acute kidney injury) ([place]) 03/10/2021   Sepsis ([place]) 03/09/2021   Acute lower UTI 03/09/2021   Acute urinary retention 03/09/2021   Liposarcoma of right thigh ([place]) 11/02/2020   Memory disorder 09/30/2019   Deep venous thrombosis ([place]) 12/16/2018   Lipoma of right lower extremity 05/20/2017   Parkinson disease ([place]) 12/03/2016   BPH (benign prostatic hyperplasia) 06/16/2013   HTN (hypertension) 06/16/2013   Obesity (BMI 30-39.9) 06/16/2013   Sarcoma of lower extremity, left ([place]) 06/14/2013   Lipoma 06/02/2012   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 Leonides Schanz, [facility]       Family History  Problem Relation Age of Onset   High blood pressure Mother    Anxiety disorder Mother    High blood pressure Father    Heart attack Father     Social History   Tobacco Use   Smoking status: Former   Smokeless tobacco: Never  Substance Use Topics   Alcohol use: Yes    Alcohol/week: 3.0 standard drinks    Types: 3 Glasses of wine per week    Comment: WIne 2-3 nights per week  Drug use: No    Home Medications Prior to Admission medications   Medication Sig Start Date End Date Taking? Authorizing Provider  acetaminophen (TYLENOL) 500 MG tablet Take 500 mg by mouth every 6 (six) hours as needed for moderate pain.   Yes [provider]  Ascorbic Acid (VITAMIN C) 500 MG CAPS Take 500 mg by mouth 3 (three) times daily.   Yes [provider]  Azelastine HCl 0.15 % SOLN Place 1 spray into the nose in the morning and at bedtime. 11/11/16  Yes [provider]  carbidopa-levodopa (SINEMET) 25-250 MG tablet Take 1 tablet  by mouth 3 (three) times daily. 12/04/20  Yes Kathrynn Ducking, MD  Cholecalciferol 25 MCG (1000 UT) tablet Take 1,000 Units by mouth daily.   Yes [provider]  Cranberry Extract 250 MG TABS Take 250 mg by mouth 3 (three) times daily.   Yes [provider]  donepezil (ARICEPT) 5 MG tablet Take 1 tablet (5 mg total) by mouth daily after breakfast. 05/10/21  Yes Kathrynn Ducking, MD  fluticasone [facility]) 50 MCG/ACT nasal spray Place 1 spray into both nostrils in the morning and at bedtime. 10/28/16  Yes [provider]  gabapentin (NEURONTIN) 300 MG capsule Take 300 mg by mouth 3 (three) times daily.   Yes [provider]  losartan-hydrochlorothiazide (HYZAAR) 100-12.5 MG tablet Take 1 tablet by mouth daily.   Yes [provider]  pramipexole (MIRAPEX) 1 MG tablet TAKE 1 TABLET BY MOUTH THREE TIMES A DAY Patient taking differently: Take 1 mg by mouth 3 (three) times daily. 04/27/21  Yes Kathrynn Ducking, MD  terazosin (HYTRIN) 10 MG capsule Take 10 mg by mouth daily. 04/28/21  Yes [provider]    Allergies    Donepezil and Sulfamethoxazole  Review of Systems   Review of Systems  Constitutional:  Negative for chills and fever.  HENT:  Negative for ear pain and sore throat.   Eyes:  Negative for pain and visual disturbance.  Respiratory:  Negative for cough and shortness of breath.   Cardiovascular:  Positive for leg swelling. Negative for chest pain and palpitations.  Gastrointestinal:  Negative for abdominal pain and vomiting.  Genitourinary:  Negative for dysuria and hematuria.  Musculoskeletal:  Positive for myalgias. Negative for arthralgias and back pain.  Skin:  Negative for color change and rash.  Neurological:  Positive for weakness. Negative for dizziness, seizures and syncope.  All other systems reviewed and are negative.  Physical Exam Updated Vital Signs BP (!) 144/88   Pulse 78   Temp 98.3 F (36.8 C) (Oral)    Resp 19   SpO2 100%   Physical Exam Vitals and nursing note reviewed.  Constitutional:      General: He is not in acute distress.    Appearance: Normal appearance. He is not toxic-appearing.  HENT:     Head: Normocephalic and atraumatic.     Mouth/Throat:     Mouth: Mucous membranes are moist.  Eyes:     General: No scleral icterus. Cardiovascular:     Rate and Rhythm: Normal rate and regular rhythm.  Pulmonary:     Effort: Pulmonary effort is normal. No respiratory distress.     Breath sounds: Normal breath sounds.  Abdominal:     General: Abdomen is flat. Bowel sounds are normal.     Palpations: Abdomen is soft.     Tenderness: There is no abdominal tenderness. There is no guarding or rebound.  Musculoskeletal:        General: Swelling present. No deformity.     Cervical back: Normal range of motion.     Right lower leg: Edema present.     Left lower leg: Edema present.     Comments: Venous stasis dermatitis present in bilateral lower extremities.  No wounds or lesions noted.  Patient has significant erythema to the right lower extremity.  See picture.  Additionally, the patient has doughy edema from groin down into mid right lower extremity.  Pulses intact.  Large palpable mass to the medial thigh near groin.  Multiple faint surgical scars present on anterior thigh.  Sensation intact.  Skin:    General: Skin is warm and dry.     Findings: No lesion.  Neurological:     General: No focal deficit present.     Mental Status: He is alert. Mental status is at baseline.     Motor: No weakness.     Comments: Slow and slightly stammered speech at baseline per wife       ED Results / Procedures / Treatments   Labs (all labs ordered are listed, but only abnormal results are displayed) Labs Reviewed  CBC WITH DIFFERENTIAL/PLATELET - Abnormal; Notable for the following components:      Result Value   RBC 3.60 (*)    Hemoglobin 10.6 (*)    HCT 32.6 (*)    Eosinophils  Absolute 1.0 (*)    All other components within normal limits  URINALYSIS, ROUTINE W REFLEX MICROSCOPIC - Abnormal; Notable for the following components:   APPearance HAZY (*)    Ketones, ur 5 (*)    Nitrite POSITIVE (*)    Leukocytes,Ua LARGE (*)    WBC, UA >50 (*)    Bacteria, UA MANY (*)    All other components within normal limits  RESP PANEL BY RT-PCR (FLU A&B, COVID) [id number]  BASIC METABOLIC PANEL    EKG EKG Interpretation  Date/Time:  Saturday November 10 2021 12:34:13 EST Ventricular Rate:  76 PR Interval:  181 QRS Duration: 96 QT Interval:  399 QTC Calculation: 449 R Axis:   -43 Text Interpretation: Sinus rhythm Left anterior fascicular block Abnormal R-wave progression, early transition Slower rate than prior. No STEMI Confirmed by Antony Blackbird [phone]) on 11/10/2021 5:01:13 PM  Radiology VAS [place] LOWER EXTREMITY VENOUS (DVT) (ONLY [facility] & [facility])  Result Date: 11/10/2021  Lower Venous DVT Study Patient Name:  Babe CAYDIN YEATTS  Date of Exam:   11/10/2021 Medical Rec #: [id number]      Accession #:    [id number] Date of Birth: [birth year]      Patient Gender: M Patient Age:   10 years Exam Location:  [facility] Procedure:      VAS [place] LOWER EXTREMITY VENOUS (DVT) Referring Phys: Anniya Whiters --------------------------------------------------------------------------------  Indications: Edema, and History of recurrent right thigh liposarcoma.  Risk Factors: Cancer Liposarcoma with multiple surgeries to remove tumors. Limitations: Multiple masses is proximal to mid thigh, massive, doughy edema. Comparison Study: Prior negative right LEV done 06/30/21 Performing Technologist: Sharion Dove RVS  Examination Guidelines: A complete evaluation includes B-mode imaging, spectral Doppler, color Doppler, and power Doppler as needed of all accessible portions of each vessel. Bilateral testing is considered an integral part of a complete examination. Limited examinations for reoccurring  indications may be performed as noted. The reflux portion of the exam is performed with the patient in reverse Trendelenburg.  +---------+---------------+---------+-----------+----------+-------------------+ RIGHT    CompressibilityPhasicitySpontaneityPropertiesThrombus Aging      +---------+---------------+---------+-----------+----------+-------------------+  CFV                                                   Not well visualized +---------+---------------+---------+-----------+----------+-------------------+ SFJ                                                   Not well visualized +---------+---------------+---------+-----------+----------+-------------------+ FV Prox                 Yes      Yes                  patent by color and                                                       Doppler             +---------+---------------+---------+-----------+----------+-------------------+ FV Mid                  Yes      Yes                  patent by color and                                                       Doppler             +---------+---------------+---------+-----------+----------+-------------------+ FV Distal               Yes      Yes                  patent by color and                                                       Doppler             +---------+---------------+---------+-----------+----------+-------------------+ PFV                                                   Not well visualized +---------+---------------+---------+-----------+----------+-------------------+ POP                     Yes      Yes                  patent by color and  Doppler             +---------+---------------+---------+-----------+----------+-------------------+ PTV      Full                                                              +---------+---------------+---------+-----------+----------+-------------------+ PERO     Full                                                             +---------+---------------+---------+-----------+----------+-------------------+   +----+---------------+---------+-----------+----------+--------------+ LEFTCompressibilityPhasicitySpontaneityPropertiesThrombus Aging +----+---------------+---------+-----------+----------+--------------+ CFV Full           Yes      Yes                                 +----+---------------+---------+-----------+----------+--------------+    Summary: RIGHT: - Venous Findings appear essentially unchanged compared to previous examination. - There is no evidence of deep vein thrombosis in the lower extremity. However, portions of this examination were limited- see technologist comments above.  - Multiple masses noted in the right proximal to mid thigh. Largest mass measures 3.89 cm X 7.57 cm and is vascularized. This large mass was not present on prior right LEV done 06/30/21 and right lower extremity venous duplex study.  LEFT: - Ultrasound characteristics of enlarged lymph nodes noted in the groin.  *See table(s) above for measurements and observations.    Preliminary     Procedures Procedures   Medications Ordered in ED Medications  cefTRIAXone (ROCEPHIN) 1 g in sodium chloride 0.9 % 100 mL IVPB (has no administration in time range)    ED Course  I have reviewed the triage vital signs and the nursing notes.  Pertinent labs & imaging results that were available during my care of the patient were reviewed by me and considered in my medical decision making (see chart for details).  80 year old patient with history of liposarcoma of the right thigh presents to the emergency department for worsening leg swelling and pain for the past 3 weeks.  Patient reports the new mass has been growing for the past few months.  He uses to get an MRI on Tuesday, but  had worsening pain and was unable to complete the procedure.  His surgical oncologist has seen him for this issue and recommended to stick with the MRI.  Patient has an indwelling urinary catheter, to which the patient nor his wife know the reason behind.  Urinalysis shows positive nitrates and leuks with greater than 50 white blood cells and many bacteria.  Consistent with UTI.  Per patient's wife, he gets these frequently, but could not tell me the last UTI date or the treatment.  Last UTI per our records was from May 2022 for which she was admitted because of sepsis.  CBC shows no leukocytosis, but mild anemia consistent with patient's previous CBCs.  BMP shows no electrolyte abnormality.  Obtained ultrasound of right lower extremity to rule out DVT.  Vascular tach informed that the patient had moderate edema and was difficult to read, but she did not appreciate any  DVT.  Reported that the mass in his medial thigh/upper groin was very vascular nature.  Discussed with pharmacy possible antibiotic treatment given cellulitis and UTI.  Pharmacy recommended a clean-catch urine or from a new catheter to differentiate bacteria as the patient has grown out different species during different urinary cultures.  If 1 g of Rocephin here for empiric treatment for cellulitis and UTI.  Vital signs stable.  Patient is not hypotensive, satting 100% O2, afebrile.  Does not meet criteria for sepsis.  Will admit patient for ileitis and UTI in the setting of immunocompromise cancer patient.  Possible MRI in an inpatient setting.  Patient is looking to transfer care to [facility] health from [facility].   Admitted to Dr. Marlowe Sax.  I discussed this case with my attending physician who cosigned this note including patient's presenting symptoms, physical exam, and planned diagnostics and interventions. Attending physician stated agreement with plan or made changes to plan which were implemented.   Attending physician assessed patient  at bedside.     MDM Rules/Calculators/A&P  Final Clinical Impression(s) / ED Diagnoses Final diagnoses:  Cellulitis of right lower extremity  Urinary tract infection associated with indwelling urethral catheter, initial encounter [facility])  Weakness    Rx / DC Orders ED Discharge Orders     None        Sherrell Puller, PA-C 11/10/21 2010    Tegeler, Gwenyth Allegra, MD 11/11/21 1420

## 2021-11-10 NOTE — ED Provider Notes (Addendum)
Emergency Medicine Provider Triage Evaluation Note  Jacob Bolton , a 80 y.o. male  was evaluated in triage.  Pt complains of weakness and right leg swelling.  He has a history of Parkinson's, was scheduled for an MRI on Tuesday of his right leg which he was unable to complete due to pain in his back.  He received Xanax during the scan, wife states that he received too much of this and has been unable to ambulate since.  According to chart review he was scheduled for the MRI of his right leg due to swelling associated with it.  Review of Systems  Positive: Weakness, right leg swelling Negative: Fevers, chills, chest pain, shortness of breath  Physical Exam  BP 105/64 (BP Location: Left Arm)   Pulse 81   Temp 98.3 F (36.8 C) (Oral)   Resp 12   SpO2 100%  Gen:   Awake, no distress   Resp:  Normal effort  MSK:   Moves extremities without difficulty  Other:  2+ pitting edema noted to right leg  Medical Decision Making  Medically screening exam initiated at 12:23 PM.  Appropriate orders placed.  Jacob Bolton was informed that the remainder of the evaluation will be completed by another provider, this initial triage assessment does not replace that evaluation, and the importance of remaining in the ED until their evaluation is complete.  Of note, discussed with vascular lab about doing DVT studies, however upon chart review it appears patient has known chronic venous congestion in the right leg. Will defer imaging until he can be seen in a room.     Jacob Bolton 11/10/21 1229    Bud Face, PA-C 11/10/21 1300    Tegeler, Gwenyth Allegra, MD 11/10/21 [phone]

## 2021-11-10 NOTE — H&P (Signed)
History and Physical    Jacob Bolton DOB: 11/27/1941 DOA: 11/10/2021  PCP: Kristen Loader, FNP Patient coming from: Home  Chief Complaint: Right lower extremity pain and swelling  HPI: Jacob Bolton is a 80 y.o. male with medical history significant of Parkinson's disease, chronic urinary retention with indwelling Foley catheter, recurrent UTIs, hypertension, right thigh liposarcoma presented to the ED for evaluation of right lower extremity pain and swelling.  Labs showing WBC 8.4, hemoglobin 10.6 (stable), platelet count 180k.  Sodium 138, potassium 3.9, chloride 103, bicarb 26, BUN 19, creatinine 0.8, glucose 98.  UA with positive nitrite, large amount of leukocytes, greater than 50 WBCs, and many bacteria. Right lower extremity Doppler negative for DVT; showing multiple masses in the proximal to mid thigh and enlarged lymph nodes in the groin. Patient was given ceftriaxone for UTI and right lower extremity cellulitis.  Patient reports 62-month history of right leg swelling.  Sometimes he experiences pain in his upper thigh.  No numbness or tingling in his leg.  He is having difficulty walking.  He is seen by Dr. Redmond Pulling at [facility] for right thigh sarcoma.  States he was recently sent for an MRI which could not be done as during the first attempt he was not able to lay flat and during the second attempt he felt claustrophobic.  Patient is not sure when his Foley catheter was last changed.  Denies fevers, chills, nausea, vomiting, or abdominal pain.  Denies cough, shortness of breath, or chest pain.  No other complaints.  Review of Systems:  All systems reviewed and apart from history of presenting illness, are negative.  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   Parkinson disease ([place]) 12/03/2016    Past Surgical History:  Procedure Laterality Date   CERVICAL DISC SURGERY  2007   Ruptured disc, plates and screws    COLONOSCOPY  2008   DEEP THIGH / KNEE TUMOR EXCISION Right 2007, 2009, 2014   Dr. Leonides Schanz, [facility]     reports that he has quit smoking. He has never used smokeless tobacco. He reports current alcohol use of about 3.0 standard drinks per week. He reports that he does not use drugs.  Allergies  Allergen Reactions   Donepezil Other (See Comments)    Nightmares (only if taken at night, its ok in the morning)   Sulfamethoxazole     Other reaction(s): Other (See Comments) Childhood allergy    Family History  Problem Relation Age of Onset   High blood pressure Mother    Anxiety disorder Mother    High blood pressure Father    Heart attack Father     Prior to Admission medications   Medication Sig Start Date End Date Taking? Authorizing Provider  acetaminophen (TYLENOL) 500 MG tablet Take 500 mg by mouth every 6 (six) hours as needed for moderate pain.   Yes [provider]  Ascorbic Acid (VITAMIN C) 500 MG CAPS Take 500 mg by mouth 3 (three) times daily.   Yes [provider]  Azelastine HCl 0.15 % SOLN Place 1 spray into the nose in the morning and at bedtime. 11/11/16  Yes [provider]  carbidopa-levodopa (SINEMET) 25-250 MG tablet Take 1 tablet by mouth 3 (three) times daily. 12/04/20  Yes Kathrynn Ducking, MD  Cholecalciferol 25 MCG (1000 UT) tablet Take 1,000 Units by mouth daily.   Yes [provider]  Cranberry Extract 250 MG TABS Take 250 mg by mouth 3 (three) times daily.   Yes [provider]  donepezil (ARICEPT) 5 MG tablet Take 1 tablet (5 mg total) by mouth daily after breakfast. 05/10/21  Yes Kathrynn Ducking, MD  fluticasone [facility] 50 MCG/ACT nasal spray Place 1 spray into both nostrils in the morning and at bedtime. 10/28/16  Yes [provider]  gabapentin (NEURONTIN) 300 MG capsule Take 300 mg by mouth 3 (three) times daily.   Yes [provider]  losartan-hydrochlorothiazide (HYZAAR) 100-12.5  MG tablet Take 1 tablet by mouth daily.   Yes [provider]  pramipexole (MIRAPEX) 1 MG tablet TAKE 1 TABLET BY MOUTH THREE TIMES A DAY Patient taking differently: Take 1 mg by mouth 3 (three) times daily. 04/27/21  Yes Kathrynn Ducking, MD  terazosin (HYTRIN) 10 MG capsule Take 10 mg by mouth daily. 04/28/21  Yes [provider]    Physical Exam: Vitals:   11/10/21 1645 11/10/21 1730 11/10/21 1830 11/10/21 1930  BP: (!) 146/71 (!) 122/54 (!) 143/114 (!) 144/88  Pulse: 87 74 76 78  Resp: 15 14 18 19   Temp:      TempSrc:      SpO2: 100% 99% 100% 100%    Physical Exam Constitutional:      General: He is not in acute distress. HENT:     Head: Normocephalic and atraumatic.  Eyes:     Extraocular Movements: Extraocular movements intact.     Conjunctiva/sclera: Conjunctivae normal.  Cardiovascular:     Rate and Rhythm: Normal rate and regular rhythm.     Pulses: Normal pulses.  Pulmonary:     Effort: Pulmonary effort is normal. No respiratory distress.     Breath sounds: Normal breath sounds. No wheezing or rales.  Abdominal:     General: Bowel sounds are normal. There is no distension.     Palpations: Abdomen is soft.     Tenderness: There is no abdominal tenderness.  Musculoskeletal:     Cervical back: Normal range of motion and neck supple.     Right lower leg: Edema present.     Comments: Right lower extremity erythematous, warm to touch, and swollen  Skin:    General: Skin is warm and dry.  Neurological:     General: No focal deficit present.     Mental Status: He is alert and oriented to person, place, and time.          Labs on Admission: I have personally reviewed following labs and imaging studies  CBC: Recent Labs  Lab 11/10/21 1225  WBC 8.4  NEUTROABS 5.7  HGB 10.6*  HCT 32.6*  MCV 90.6  PLT 119   Basic Metabolic Panel: Recent Labs  Lab 11/10/21 1225  NA 138  K 3.9  CL 103  CO2 26  GLUCOSE 98  BUN 19  CREATININE 0.85   CALCIUM 8.9   GFR: CrCl cannot be calculated (Unknown ideal weight.). Liver Function Tests: No results for input(s): AST, ALT, ALKPHOS, BILITOT, PROT, ALBUMIN in the last 168 hours. No results for input(s): LIPASE, AMYLASE in the last 168 hours. No results for input(s): AMMONIA in the last 168 hours. Coagulation Profile: No results for input(s): INR, PROTIME in the last 168 hours. Cardiac Enzymes: No results for input(s): CKTOTAL, CKMB, CKMBINDEX, TROPONINI in the last 168 hours. BNP (last 3 results) No results for input(s): PROBNP in the last 8760 hours. HbA1C: No results for input(s): HGBA1C in  the last 72 hours. CBG: No results for input(s): GLUCAP in the last 168 hours. Lipid Profile: No results for input(s): CHOL, HDL, LDLCALC, TRIG, CHOLHDL, LDLDIRECT in the last 72 hours. Thyroid Function Tests: No results for input(s): TSH, T4TOTAL, FREET4, T3FREE, THYROIDAB in the last 72 hours. Anemia Panel: No results for input(s): VITAMINB12, FOLATE, FERRITIN, TIBC, IRON, RETICCTPCT in the last 72 hours. Urine analysis:    Component Value Date/Time   COLORURINE YELLOW 11/10/2021 1226   APPEARANCEUR HAZY (A) 11/10/2021 1226   LABSPEC 1.014 11/10/2021 1226   PHURINE 5.0 11/10/2021 1226   GLUCOSEU NEGATIVE 11/10/2021 1226   HGBUR NEGATIVE 11/10/2021 1226   BILIRUBINUR NEGATIVE 11/10/2021 1226   KETONESUR 5 (A) 11/10/2021 1226   PROTEINUR NEGATIVE 11/10/2021 1226   NITRITE POSITIVE (A) 11/10/2021 1226   LEUKOCYTESUR LARGE (A) 11/10/2021 1226    Radiological Exams on Admission: VAS [place] LOWER EXTREMITY VENOUS (DVT) (ONLY [facility] & [facility])  Result Date: 11/10/2021  Lower Venous DVT Study Patient Name:  Arliss TRESTEN PANTOJA  Date of Exam:   11/10/2021 Medical Rec #: [id number]      Accession #:    [id number] Date of Birth: [birth year]      Patient Gender: M Patient Age:   75 years Exam Location:  [facility] Procedure:      VAS [place] LOWER EXTREMITY VENOUS (DVT) Referring Phys: RILEY RANSOM  --------------------------------------------------------------------------------  Indications: Edema, and History of recurrent right thigh liposarcoma.  Risk Factors: Cancer Liposarcoma with multiple surgeries to remove tumors. Limitations: Multiple masses is proximal to mid thigh, massive, doughy edema. Comparison Study: Prior negative right LEV done 06/30/21 Performing Technologist: Sharion Dove RVS  Examination Guidelines: A complete evaluation includes B-mode imaging, spectral Doppler, color Doppler, and power Doppler as needed of all accessible portions of each vessel. Bilateral testing is considered an integral part of a complete examination. Limited examinations for reoccurring indications may be performed as noted. The reflux portion of the exam is performed with the patient in reverse Trendelenburg.  +---------+---------------+---------+-----------+----------+-------------------+ RIGHT    CompressibilityPhasicitySpontaneityPropertiesThrombus Aging      +---------+---------------+---------+-----------+----------+-------------------+ CFV                                                   Not well visualized +---------+---------------+---------+-----------+----------+-------------------+ SFJ                                                   Not well visualized +---------+---------------+---------+-----------+----------+-------------------+ FV Prox                 Yes      Yes                  patent by color and                                                       Doppler             +---------+---------------+---------+-----------+----------+-------------------+ FV Mid  Yes      Yes                  patent by color and                                                       Doppler             +---------+---------------+---------+-----------+----------+-------------------+ FV Distal               Yes      Yes                  patent by  color and                                                       Doppler             +---------+---------------+---------+-----------+----------+-------------------+ PFV                                                   Not well visualized +---------+---------------+---------+-----------+----------+-------------------+ POP                     Yes      Yes                  patent by color and                                                       Doppler             +---------+---------------+---------+-----------+----------+-------------------+ PTV      Full                                                             +---------+---------------+---------+-----------+----------+-------------------+ PERO     Full                                                             +---------+---------------+---------+-----------+----------+-------------------+   +----+---------------+---------+-----------+----------+--------------+ LEFTCompressibilityPhasicitySpontaneityPropertiesThrombus Aging +----+---------------+---------+-----------+----------+--------------+ CFV Full           Yes      Yes                                 +----+---------------+---------+-----------+----------+--------------+    Summary: RIGHT: - Venous Findings appear essentially unchanged compared to previous examination. - There is no evidence of deep vein thrombosis in the  lower extremity. However, portions of this examination were limited- see technologist comments above.  - Multiple masses noted in the right proximal to mid thigh. Largest mass measures 3.89 cm X 7.57 cm and is vascularized. This large mass was not present on prior right LEV done 06/30/21 and right lower extremity venous duplex study.  LEFT: - Ultrasound characteristics of enlarged lymph nodes noted in the groin.  *See table(s) above for measurements and observations.    Preliminary     EKG: Independently reviewed.  Sinus rhythm,  rate decreased but no other significant change since prior tracing.  Assessment/Plan Principal Problem:   UTI (urinary tract infection) due to urinary indwelling catheter (HCC) Active Problems:   Parkinson disease ([place])   HTN (hypertension)   Liposarcoma (HCC)   Cellulitis  CAUTI Has an indwelling Foley catheter due to chronic urinary retention with history of recurrent UTIs.  UA with positive nitrite, large amount of leukocytes, greater than 50 WBCs, and many bacteria.  No fever, leukocytosis, or signs of sepsis. Previous urine cultures done this year growing Klebsiella oxytoca and Enterobacter cloacae.  Pharmacy recommended ceftriaxone. -Continue ceftriaxone.  Add on urine culture.  Replace Foley catheter.  Possible right lower extremity cellulitis Right lower leg appears erythematous and warm to touch.  Doppler negative for DVT. -Continue ceftriaxone  Right lower extremity pain and swelling in the setting of known history of right thigh liposarcoma Followed by Dr. Redmond Pulling at [facility] and is supposed to get a follow-up MRI done.  Per patient, MRI was attempted twice but could not be done due to inability to lay flat/claustrophobia.  Right lower extremity Doppler done today negative for DVT; showing multiple masses in the proximal to mid thigh and enlarged lymph nodes in the groin. -MRI ordered.  Ativan prior to MRI to help with anxiety.  Parkinson's disease -Continue home medications  Hypertension Stable. -Continue home medications  DVT prophylaxis: Subcutaneous heparin Code Status: Patient wishes to be full code. Family Communication: No family available at this time. Disposition Plan: Status is: Observation  The patient remains OBS appropriate and will d/c before 2 midnights.  Level of care: MedSurg  The medical decision making on this patient was of high complexity and the patient is at high risk for clinical deterioration, therefore this is a level 3  visit.  Shela Leff MD [facility]  If 7PM-7AM, please contact night-coverage [email or web address]  11/10/2021, 8:41 PM

## 2021-11-10 NOTE — Progress Notes (Addendum)
VASCULAR LAB    Right lower extremity venous duplex has been performed.  See CV proc for preliminary results.  Gave verbal report to Sherrell Puller, PA-C   Serigne Kubicek, [facility], RVT 11/10/2021, 6:45 PM

## 2021-11-11 ENCOUNTER — Observation Stay (HOSPITAL_COMMUNITY): Payer: PPO

## 2021-11-11 DIAGNOSIS — K529 Noninfective gastroenteritis and colitis, unspecified: Secondary | ICD-10-CM | POA: Diagnosis present

## 2021-11-11 DIAGNOSIS — Z79899 Other long term (current) drug therapy: Secondary | ICD-10-CM | POA: Diagnosis not present

## 2021-11-11 DIAGNOSIS — F419 Anxiety disorder, unspecified: Secondary | ICD-10-CM | POA: Diagnosis present

## 2021-11-11 DIAGNOSIS — Y846 Urinary catheterization as the cause of abnormal reaction of the patient, or of later complication, without mention of misadventure at the time of the procedure: Secondary | ICD-10-CM | POA: Diagnosis present

## 2021-11-11 DIAGNOSIS — L0390 Cellulitis, unspecified: Secondary | ICD-10-CM | POA: Diagnosis present

## 2021-11-11 DIAGNOSIS — Z20822 Contact with and (suspected) exposure to covid-19: Secondary | ICD-10-CM | POA: Diagnosis present

## 2021-11-11 DIAGNOSIS — B961 Klebsiella pneumoniae [K. pneumoniae] as the cause of diseases classified elsewhere: Secondary | ICD-10-CM | POA: Diagnosis present

## 2021-11-11 DIAGNOSIS — R600 Localized edema: Secondary | ICD-10-CM

## 2021-11-11 DIAGNOSIS — N390 Urinary tract infection, site not specified: Secondary | ICD-10-CM | POA: Diagnosis present

## 2021-11-11 DIAGNOSIS — I10 Essential (primary) hypertension: Secondary | ICD-10-CM | POA: Diagnosis present

## 2021-11-11 DIAGNOSIS — F40240 Claustrophobia: Secondary | ICD-10-CM | POA: Diagnosis present

## 2021-11-11 DIAGNOSIS — R262 Difficulty in walking, not elsewhere classified: Secondary | ICD-10-CM | POA: Diagnosis present

## 2021-11-11 DIAGNOSIS — Z888 Allergy status to other drugs, medicaments and biological substances status: Secondary | ICD-10-CM | POA: Diagnosis not present

## 2021-11-11 DIAGNOSIS — C4921 Malignant neoplasm of connective and soft tissue of right lower limb, including hip: Secondary | ICD-10-CM | POA: Diagnosis present

## 2021-11-11 DIAGNOSIS — Z8249 Family history of ischemic heart disease and other diseases of the circulatory system: Secondary | ICD-10-CM | POA: Diagnosis not present

## 2021-11-11 DIAGNOSIS — T83511A Infection and inflammatory reaction due to indwelling urethral catheter, initial encounter: Secondary | ICD-10-CM | POA: Diagnosis present

## 2021-11-11 DIAGNOSIS — G20 Parkinson's disease: Secondary | ICD-10-CM

## 2021-11-11 DIAGNOSIS — Z882 Allergy status to sulfonamides status: Secondary | ICD-10-CM | POA: Diagnosis not present

## 2021-11-11 DIAGNOSIS — Z818 Family history of other mental and behavioral disorders: Secondary | ICD-10-CM | POA: Diagnosis not present

## 2021-11-11 DIAGNOSIS — Z87891 Personal history of nicotine dependence: Secondary | ICD-10-CM | POA: Diagnosis not present

## 2021-11-11 DIAGNOSIS — L03115 Cellulitis of right lower limb: Secondary | ICD-10-CM | POA: Diagnosis present

## 2021-11-11 DIAGNOSIS — R2241 Localized swelling, mass and lump, right lower limb: Secondary | ICD-10-CM | POA: Diagnosis not present

## 2021-11-11 MED ORDER — CHLORHEXIDINE GLUCONATE CLOTH 2 % EX PADS
6.0000 | MEDICATED_PAD | Freq: Every day | CUTANEOUS | Status: DC
  Administered 2021-11-11 – 2021-11-12 (×2): 6 via TOPICAL

## 2021-11-11 MED ORDER — GADOBUTROL 1 MMOL/ML IV SOLN: 9.0000 mL | Freq: Once | INTRAVENOUS | Status: DC | PRN

## 2021-11-11 NOTE — Assessment & Plan Note (Addendum)
-   foley replaced on admission - follow up cultures; growing Kleb oxytoca  - continue Rocephin while in hospital.  Discharged with cefadroxil to complete 7-day course total

## 2021-11-11 NOTE — Assessment & Plan Note (Addendum)
-   follows with Dr. Redmond Pulling at [facility]  - 06/06/17: Radical resection of deep soft tissue sarcoma, right thigh.  - 02/05/21: Radical resection of soft tissue tumor, right thigh >8cm due to impingement on sciatic nerve   - Has been planned for MRI but unable to be completed outpatient due to pain - again unable to complete on 11/13; scout images showed large mass measuring 13.6 x 16.6 x 21.0 cm  - has been on Neurontin and utilizing cane and PT - continue PT inpatient (SNF recommended); discussed with patient and his wife, SNF declined and patient wishes to return home with home health at time of discharge - outpatient followup with Dr. Redmond Pulling

## 2021-11-11 NOTE — Assessment & Plan Note (Signed)
continue current regimen.

## 2021-11-11 NOTE — Hospital Course (Addendum)
Mr. Couper is a 80 yo male with PMH Parkinson's disease, chronic urinary retention with indwelling Foley catheter, recurrent UTIs, HTN, right thigh soft tissue sarcoma who presented to the ED for evaluation of right lower extremity pain and swelling.    Right lower extremity Doppler negative for DVT; showing multiple masses in the proximal to mid thigh and enlarged lymph nodes in the groin.   UA with positive nitrite, large amount of leukocytes, greater than 50 WBCs, and many bacteria.  Patient was given ceftriaxone for UTI and right lower extremity cellulitis. Patient is not sure when his Foley catheter was last changed.    Patient reports 85-month history of right leg swelling.  Sometimes he experiences pain in his upper thigh.  No numbness or tingling in his leg.  He is having difficulty walking.  He is seen by Dr. Redmond Pulling at [facility] for right thigh sarcoma.  States he was recently sent for an MRI which could not be done as during the first attempt he was not able to lay flat and during the second attempt he felt claustrophobic.   He was started on Rocephin for presumed UTI and admitted for further work-up and MRI of right thigh.  He was unable to again tolerate MRI but scout images showed large underlying sarcoma. Wife informed and outpatient follow-up with his surgeon is planned.   Urine culture grew Klebsiella oxytoca and he was de-escalated from Rocephin down to cefadroxil at discharge to complete course.

## 2021-11-11 NOTE — Assessment & Plan Note (Addendum)
-   continue home meds - slowed mentation noted

## 2021-11-11 NOTE — Progress Notes (Signed)
Progress Note    Jacob Bolton   GDJ:[id number]  DOB: [birth year]  DOA: 11/10/2021     0 PCP: Kristen Loader, FNP  Initial CC: RLE pain/swelling  Hospital Course: Jacob Bolton is a 80 yo male with PMH Parkinson's disease, chronic urinary retention with indwelling Foley catheter, recurrent UTIs, HTN, right thigh soft tissue sarcoma who presented to the ED for evaluation of right lower extremity pain and swelling.    Right lower extremity Doppler negative for DVT; showing multiple masses in the proximal to mid thigh and enlarged lymph nodes in the groin.   UA with positive nitrite, large amount of leukocytes, greater than 50 WBCs, and many bacteria.  Patient was given ceftriaxone for UTI and right lower extremity cellulitis. Patient is not sure when his Foley catheter was last changed.    Patient reports 56-month history of right leg swelling.  Sometimes he experiences pain in his upper thigh.  No numbness or tingling in his leg.  He is having difficulty walking.  He is seen by Dr. Redmond Pulling at [facility] for right thigh sarcoma.  States he was recently sent for an MRI which could not be done as during the first attempt he was not able to lay flat and during the second attempt he felt claustrophobic.   He was started on Rocephin for presumed UTI and admitted for further work-up and MRI of right thigh.   Interval History:  Seen in room this afternoon; stated he could not tolerate MRI due to pain once again in thigh although some images pending currently. His thought process is slowed with his mentation but this may be in setting of his Parkinson's.  Otherwise he did not have hardly any pain when I manipulated his RLE passively and stated that he wished to walk/get out of bed.   Assessment & Plan: * UTI (urinary tract infection) due to urinary indwelling catheter ([place]) - foley replaced on admission - follow up cultures - continue Rocephin   Edema of right lower extremity - follow up  RLE MRI (appears wasn't able to tolerate due to pain,unsure if part of this study was able to be done before aborted) - RLE duplex negative for DVT but shows multiple masses in the right proximal to mid thigh with largest mass measuring 3.89 x 7.57 cm and vascularized (not present on prior duplex performed 06/30/2021) - suspect edema and erythema due to underlying pathologic process  Soft tissue sarcoma of thigh, right ([place])  - follows with Dr. Redmond Pulling at [facility]  - 06/06/17: Radical resection of deep soft tissue sarcoma, right thigh.  - 02/05/21: Radical resection of soft tissue tumor, right thigh >8cm due to impingement on sciatic nerve   - Has been planned for MRI but unable to be completed outpatient due to pain - again unable to complete on 11/13; followup any pending images  - has been on Neurontin and utilizing cane and PT - continue PT inpatient - outpatient followup with Dr. Redmond Pulling   HTN (hypertension) - continue current regimen  Parkinson disease ([place]) - continue home meds - slowed mentation noted    Old records reviewed in assessment of this patient  Antimicrobials: Rocephin 11/10/21 >> current  DVT prophylaxis: HSQ  Code Status:   Code Status: Full Code  Disposition Plan:   Status is: Observation   Objective: Blood pressure 121/76, pulse 75, temperature 98.2 F (36.8 C), temperature source Oral, resp. rate 18, SpO2 98 %.  Examination:  Physical Exam Constitutional:  General: He is not in acute distress.    Appearance: Normal appearance. He is not ill-appearing.     Comments: Slowed mentation   HENT:     Head: Normocephalic and atraumatic.     Mouth/Throat:     Mouth: Mucous membranes are moist.  Eyes:     Extraocular Movements: Extraocular movements intact.  Cardiovascular:     Rate and Rhythm: Normal rate and regular rhythm.  Pulmonary:     Effort: Pulmonary effort is normal. No respiratory distress.     Breath sounds: Normal breath sounds. No wheezing.   Abdominal:     General: Bowel sounds are normal. There is no distension.     Palpations: Abdomen is soft.     Tenderness: There is no abdominal tenderness.  Musculoskeletal:        General: Normal range of motion.     Cervical back: Normal range of motion and neck supple.     Comments: Passive ROM intact with RLE; notable edema in right thigh and leg 2+   Skin:    Comments: Mild RLE stasis changes; no obvious erythema or lesions  Neurological:     Comments: Oriented to name, year, city. Did not know president and said 2020 initially; very slowed mentation/thought process in setting of underlying Parkinson's  Psychiatric:        Mood and Affect: Mood normal.        Behavior: Behavior normal.        Thought Content: Thought content normal.     Consultants:    Procedures:    Data Reviewed: I have personally reviewed labs and imaging studies     LOS: 0 days  Time spent: Greater than 50% of the 35 minute visit was spent in counseling/coordination of care for the patient as laid out in the A&P.   Dwyane Dee, MD [facility] 11/11/2021, 2:15 PM

## 2021-11-11 NOTE — Assessment & Plan Note (Addendum)
-   follow up RLE MRI (appears wasn't able to tolerate due to pain,unsure if part of this study was able to be done before aborted) - RLE duplex negative for DVT but shows multiple masses in the right proximal to mid thigh with largest mass measuring 3.89 x 7.57 cm and vascularized (not present on prior duplex performed 06/30/2021) - suspect edema and erythema due to underlying pathologic process

## 2021-11-12 ENCOUNTER — Encounter (HOSPITAL_COMMUNITY): Payer: Self-pay | Admitting: Internal Medicine

## 2021-11-12 LAB — CBC WITH DIFFERENTIAL/PLATELET
Abs Immature Granulocytes: 0.03 10*3/uL (ref 0.00–0.07)
Basophils Absolute: 0.1 10*3/uL (ref 0.0–0.1)
Basophils Relative: 1 %
Eosinophils Absolute: 1.1 10*3/uL — ABNORMAL HIGH (ref 0.0–0.5)
Eosinophils Relative: 12 %
HCT: 31.7 % — ABNORMAL LOW (ref 39.0–52.0)
Hemoglobin: 10.3 g/dL — ABNORMAL LOW (ref 13.0–17.0)
Immature Granulocytes: 0 %
Lymphocytes Relative: 12 %
Lymphs Abs: 1.2 10*3/uL (ref 0.7–4.0)
MCH: 29.7 pg (ref 26.0–34.0)
MCHC: 32.5 g/dL (ref 30.0–36.0)
MCV: 91.4 fL (ref 80.0–100.0)
Monocytes Absolute: 1 10*3/uL (ref 0.1–1.0)
Monocytes Relative: 11 %
Neutro Abs: 6 10*3/uL (ref 1.7–7.7)
Neutrophils Relative %: 64 %
Platelets: 168 10*3/uL (ref 150–400)
RBC: 3.47 MIL/uL — ABNORMAL LOW (ref 4.22–5.81)
RDW: 13.5 % (ref 11.5–15.5)
WBC: 9.4 10*3/uL (ref 4.0–10.5)
nRBC: 0 % (ref 0.0–0.2)

## 2021-11-12 LAB — MAGNESIUM: Magnesium: 1.9 mg/dL (ref 1.7–2.4)

## 2021-11-12 LAB — BASIC METABOLIC PANEL
Anion gap: 11 (ref 5–15)
BUN: 23 mg/dL (ref 8–23)
CO2: 27 mmol/L (ref 22–32)
Calcium: 8.9 mg/dL (ref 8.9–10.3)
Chloride: 101 mmol/L (ref 98–111)
Creatinine, Ser: 0.86 mg/dL (ref 0.61–1.24)
GFR, Estimated: >60 mL/min (ref >60)
Glucose, Bld: 89 mg/dL (ref 70–99)
Potassium: 3.7 mmol/L (ref 3.5–5.1)
Sodium: 139 mmol/L (ref 135–145)

## 2021-11-12 MED ORDER — OXYCODONE HCL 5 MG PO TABS
5.0000 mg | ORAL_TABLET | ORAL | Status: DC | PRN
  Filled 2021-11-12: qty 1

## 2021-11-12 NOTE — Telephone Encounter (Signed)
Any suggestions>?

## 2021-11-12 NOTE — Evaluation (Signed)
Physical Therapy Evaluation Patient Details Name: Jacob Bolton MRN: [id number] DOB: [birth year] Today's Date: 11/12/2021  History of Present Illness  Pt is 80 yo male admitted with R LE pain and swelling on 11/10/21 pt found to have UTI due to indwelling catheter and possible R LE cellulitis.  Pt with hx of R thigh liposarcoma, Parkinson's, urinary retention, HTN.  Clinical Impression  Pt admitted with above diagnosis. At baseline, pt reports he is independent and does not use AD.  He lives with his wife who is not able to physically assist , daughter and son can help intermittently.  Today, pt requiring mod A for bed mobility and standing with min A for walking 65'.  He required cues for sequencing and increased time for transfers.  Pt does report this is his first time up in several days.  He is expected to progress well but may need SNF due to level of assist required and wife unable to assist.  Pt could progress to home if had increased physical assist and/or progressed to min guard level.  Recommend OOB multiple times daily with nursing as well as continued therapy. Pt currently with functional limitations due to the deficits listed below (see PT Problem List). Pt will benefit from skilled PT to increase their independence and safety with mobility to allow discharge to the venue listed below.          Recommendations for follow up therapy are one component of a multi-disciplinary discharge planning process, led by the attending physician.  Recommendations may be updated based on patient status, additional functional criteria and insurance authorization.  Follow Up Recommendations Skilled nursing-short term rehab (<3 hours/day)    Assistance Recommended at Discharge Frequent or constant Supervision/Assistance  Functional Status Assessment Patient has had a recent decline in their functional status and demonstrates the ability to make significant improvements in function in a reasonable and  predictable amount of time.  Equipment Recommendations  None recommended by PT    Recommendations for Other Services       Precautions / Restrictions Precautions Precautions: Fall Restrictions Weight Bearing Restrictions: No      Mobility  Bed Mobility Overal bed mobility: Needs Assistance Bed Mobility: Supine to Sit     Supine to sit: Mod assist     General bed mobility comments: Increased time, use of rails , cue for sequencing    Transfers Overall transfer level: Needs assistance Equipment used: Rolling walker (2 wheels) Transfers: Sit to/from Stand Sit to Stand: Mod assist;From elevated surface           General transfer comment: Mod A to rise from elevated surface with significantly increased time and with cues for hand placement    Ambulation/Gait Ambulation/Gait assistance: Min assist Gait Distance (Feet): 65 Feet Assistive device: Rolling walker (2 wheels) Gait Pattern/deviations: Step-to pattern;Decreased stride length Gait velocity: decreased     General Gait Details: min A initially for balance progressing to min guard; min cues for RW use and proximity; decreased stride length but did clear feet of ground  Stairs            Wheelchair Mobility    Modified Rankin (Stroke Patients Only)       Balance Overall balance assessment: Needs assistance Sitting-balance support: Single extremity supported Sitting balance-Leahy Scale: Poor Sitting balance - Comments: Initially requiring at least single UE support with tendency to posterior lean requiring min A; progressed to stand by but pt still preferring use of UE.  Tried to  work on reaching/leaning forward but pt reports hurst lipsoarcoma nodule in R leg   Standing balance support: Bilateral upper extremity supported;Reliant on assistive device for balance Standing balance-Leahy Scale: Poor                               Pertinent Vitals/Pain Pain Assessment: No/denies pain     Home Living Family/patient expects to be discharged to:: Private residence Living Arrangements: Spouse/significant other Available Help at Discharge: Family;Available 24 hours/day (Wife unable to physically assist; daughter checks on prn) Type of Home: Apartment Home Access: Stairs to enter Entrance Stairs-Rails: Left Entrance Stairs-Number of Steps: 3   Home Layout: One level Home Equipment: Grab bars - tub/shower;Grab bars - toilet;Cane - single point;Rolling Walker (2 wheels)      Prior Function Prior Level of Function : Independent/Modified Independent             Mobility Comments: Normally does not need AD; can ambulate in community ADLs Comments: Independent with ADLs, wife does most of IADLs but pt could assist     Hand Dominance        Extremity/Trunk Assessment   Upper Extremity Assessment Upper Extremity Assessment: Overall WFL for tasks assessed (ROM WFL; MMT strong 5/5)    Lower Extremity Assessment Lower Extremity Assessment: Overall WFL for tasks assessed (ROM WFL; MMT strong 5/5; slight decrease in coordination)    Cervical / Trunk Assessment Cervical / Trunk Assessment: Kyphotic  Communication   Communication: No difficulties  Cognition Arousal/Alertness: Awake/alert Behavior During Therapy: Flat affect Overall Cognitive Status: No family/caregiver present to determine baseline cognitive functioning                                 General Comments: Pt oriented to self, location, situation but not date.  He is slow to respond at times (likely baseline due to Parkinson); eager to get [place] comments (skin integrity, edema, etc.): Discussed SNF vs home with pt.  He would prefer home, but wife unable to physically assist.  Reports his son and daughter can assist some but son works in day and daughter has appts/etc for breast CA.    Exercises     Assessment/Plan    PT Assessment Patient needs  continued PT services  PT Problem List Decreased strength;Decreased mobility;Decreased coordination;Decreased activity tolerance;Decreased balance;Decreased cognition;Decreased knowledge of use of DME       PT Treatment Interventions DME instruction;Therapeutic activities;Gait training;Therapeutic exercise;Patient/family education;Stair training;Functional mobility training;Balance training;Neuromuscular re-education    PT Goals (Current goals can be found in the Care Plan section)  Acute Rehab PT Goals Patient Stated Goal: return home PT Goal Formulation: With patient Time For Goal Achievement: 11/26/21 Potential to Achieve Goals: Good    Frequency Min 3X/week   Barriers to discharge Decreased caregiver support      Co-evaluation               AM-PAC PT "6 Clicks" Mobility  Outcome Measure Help needed turning from your back to your side while in a flat bed without using bedrails?: A Lot Help needed moving from lying on your back to sitting on the side of a flat bed without using bedrails?: A Lot Help needed moving to and from a bed to a chair (including a wheelchair)?: A Lot Help needed standing up from a  chair using your arms (e.g., wheelchair or bedside chair)?: A Lot Help needed to walk in hospital room?: A Little Help needed climbing 3-5 steps with a railing? : Total 6 Click Score: 12    End of Session Equipment Utilized During Treatment: Gait belt Activity Tolerance: Patient tolerated treatment well Patient left: with chair alarm set;in chair;with call bell/phone within reach Nurse Communication: Mobility status (Assit of 2 to stand from chair) PT Visit Diagnosis: Other abnormalities of gait and mobility (R26.89);Unsteadiness on feet (R26.81)    Time: 1610-9604 PT Time Calculation (min) (ACUTE ONLY): 43 min   Charges:   PT Evaluation $PT Eval Low Complexity: 1 Low PT Treatments $Gait Training: 8-22 mins $Therapeutic Activity: 8-22 mins        Abran Richard,  PT Acute Rehab Services Pager [phone] [facility] Rafael Capo 11/12/2021, 1:57 PM

## 2021-11-12 NOTE — Progress Notes (Signed)
Progress Note    Jacob Bolton   JEH:[id number]  DOB: Sep 25, 1941  DOA: 11/10/2021     1 PCP: Jacob Loader, FNP  Initial CC: RLE pain/swelling  Hospital Course: Jacob Bolton is a 80 yo male with PMH Parkinson's disease, chronic urinary retention with indwelling Foley catheter, recurrent UTIs, HTN, right thigh soft tissue sarcoma who presented to the ED for evaluation of right lower extremity pain and swelling.    Right lower extremity Doppler negative for DVT; showing multiple masses in the proximal to mid thigh and enlarged lymph nodes in the groin.   UA with positive nitrite, large amount of leukocytes, greater than 50 WBCs, and many bacteria.  Patient was given ceftriaxone for UTI and right lower extremity cellulitis. Patient is not sure when his Foley catheter was last changed.    Patient reports 59-month history of right leg swelling.  Sometimes he experiences pain in his upper thigh.  No numbness or tingling in his leg.  He is having difficulty walking.  He is seen by Jacob Bolton at [facility] for right thigh sarcoma.  States he was recently sent for an MRI which could not be done as during the first attempt he was not able to lay flat and during the second attempt he felt claustrophobic.   He was started on Rocephin for presumed UTI and admitted for further work-up and MRI of right thigh.   Interval History:  No events overnight.  Seen this morning as well as again this afternoon when his wife was present bedside.  Tentative plan is for continue to work with PT and tentative discharge home tomorrow if able but appears that PT recommending SNF, so we will need to further discuss with wife on preferences. Otherwise he appears about the same, still having pain in his right groin/thigh.  Discussed trial of pain regimen today with his wife. She understands plan for needing to continue following outpatient with Jacob Bolton.  Assessment & Plan: * UTI (urinary tract infection) due  to urinary indwelling catheter ([place]) - foley replaced on admission - follow up cultures; growing Kleb oxytoca  - continue Rocephin   Edema of right lower extremity - follow up RLE MRI (appears wasn't able to tolerate due to pain,unsure if part of this study was able to be done before aborted) - RLE duplex negative for DVT but shows multiple masses in the right proximal to mid thigh with largest mass measuring 3.89 x 7.57 cm and vascularized (not present on prior duplex performed 06/30/2021) - suspect edema and erythema due to underlying pathologic process  Soft tissue sarcoma of thigh, right ([place])  - follows with Jacob [facility] at [facility]  - 06/06/17: Radical resection of deep soft tissue sarcoma, right thigh.  - 02/05/21: Radical resection of soft tissue tumor, right thigh >8cm due to impingement on sciatic nerve   - Has been planned for MRI but unable to be completed outpatient due to pain - again unable to complete on 11/13; scout images showed large mass measuring 13.6 x 16.6 x 21.0 cm  - has been on Neurontin and utilizing cane and PT - continue PT inpatient (SNF recommended) - outpatient followup with Jacob Bolton   HTN (hypertension) - continue current regimen  Parkinson disease ([place]) - continue home meds - slowed mentation noted     Old records reviewed in assessment of this patient  Antimicrobials: Rocephin 11/10/21 >> current  DVT prophylaxis: HSQ  Code Status:   Code Status: Full  Code  Disposition Plan:   Status is: Inpatient    Objective: Blood pressure (!) 92/59, pulse 85, temperature 98 F (36.7 C), temperature source Oral, resp. rate 18, SpO2 99 %.  Examination:  Physical Exam Constitutional:      General: He is not in acute distress.    Appearance: Normal appearance. He is not ill-appearing.     Comments: Slowed mentation   HENT:     Head: Normocephalic and atraumatic.     Mouth/Throat:     Mouth: Mucous membranes are moist.  Eyes:     Extraocular Movements:  Extraocular movements intact.  Cardiovascular:     Rate and Rhythm: Normal rate and regular rhythm.  Pulmonary:     Effort: Pulmonary effort is normal. No respiratory distress.     Breath sounds: Normal breath sounds. No wheezing.  Abdominal:     General: Bowel sounds are normal. There is no distension.     Palpations: Abdomen is soft.     Tenderness: There is no abdominal tenderness.  Musculoskeletal:        General: Normal range of motion.     Cervical back: Normal range of motion and neck supple.     Comments: Passive ROM intact with RLE; notable edema in right thigh and leg 2+   Skin:    Comments: Mild RLE stasis changes; no obvious erythema or lesions  Neurological:     Mental Status: Mental status is at baseline.     Comments: Slowed mentation; baseline  Psychiatric:        Mood and Affect: Mood normal.        Behavior: Behavior normal.        Thought Content: Thought content normal.     Consultants:    Procedures:    Data Reviewed: I have personally reviewed labs and imaging studies     LOS: 1 day  Time spent: Greater than 50% of the 35 minute visit was spent in counseling/coordination of care for the patient as laid out in the A&P.   Jacob Dee, MD [facility] 11/12/2021, 2:10 PM

## 2021-11-13 LAB — CBC WITH DIFFERENTIAL/PLATELET
Abs Immature Granulocytes: 0.04 10*3/uL (ref 0.00–0.07)
Basophils Absolute: 0.1 10*3/uL (ref 0.0–0.1)
Basophils Relative: 1 %
Eosinophils Absolute: 1.2 10*3/uL — ABNORMAL HIGH (ref 0.0–0.5)
Eosinophils Relative: 12 %
HCT: 30.3 % — ABNORMAL LOW (ref 39.0–52.0)
Hemoglobin: 9.9 g/dL — ABNORMAL LOW (ref 13.0–17.0)
Immature Granulocytes: 0 %
Lymphocytes Relative: 12 %
Lymphs Abs: 1.2 10*3/uL (ref 0.7–4.0)
MCH: 29.5 pg (ref 26.0–34.0)
MCHC: 32.7 g/dL (ref 30.0–36.0)
MCV: 90.2 fL (ref 80.0–100.0)
Monocytes Absolute: 1 10*3/uL (ref 0.1–1.0)
Monocytes Relative: 10 %
Neutro Abs: 6.4 10*3/uL (ref 1.7–7.7)
Neutrophils Relative %: 65 %
Platelets: 157 10*3/uL (ref 150–400)
RBC: 3.36 MIL/uL — ABNORMAL LOW (ref 4.22–5.81)
RDW: 13.8 % (ref 11.5–15.5)
WBC: 10 10*3/uL (ref 4.0–10.5)
nRBC: 0 % (ref 0.0–0.2)

## 2021-11-13 LAB — MAGNESIUM: Magnesium: 1.8 mg/dL (ref 1.7–2.4)

## 2021-11-13 LAB — BASIC METABOLIC PANEL
Anion gap: 7 (ref 5–15)
BUN: 25 mg/dL — ABNORMAL HIGH (ref 8–23)
CO2: 27 mmol/L (ref 22–32)
Calcium: 8.7 mg/dL — ABNORMAL LOW (ref 8.9–10.3)
Chloride: 103 mmol/L (ref 98–111)
Creatinine, Ser: 0.88 mg/dL (ref 0.61–1.24)
GFR, Estimated: >60 mL/min (ref >60)
Glucose, Bld: 95 mg/dL (ref 70–99)
Potassium: 3.6 mmol/L (ref 3.5–5.1)
Sodium: 137 mmol/L (ref 135–145)

## 2021-11-13 MED ORDER — OXYCODONE HCL 5 MG PO TABS: 5.0000 mg | ORAL_TABLET | Freq: Four times a day (QID) | ORAL | 0 refills | Status: DC | PRN

## 2021-11-13 MED ORDER — CEFADROXIL 500 MG PO CAPS
1000.0000 mg | ORAL_CAPSULE | Freq: Two times a day (BID) | ORAL | Status: DC
  Filled 2021-11-13: qty 2

## 2021-11-13 MED ORDER — CEFADROXIL 500 MG PO CAPS: 1000.0000 mg | ORAL_CAPSULE | Freq: Two times a day (BID) | ORAL | 0 refills | Status: AC

## 2021-11-13 NOTE — Telephone Encounter (Signed)
PHONE RM CAN RELAY [place] wife back, LVM requesting call back.

## 2021-11-13 NOTE — Telephone Encounter (Signed)
Our office did not order any imaging - looks like an MRI was ordered for right thigh mass - they should reach out to ortho in regards to this. Thank you.

## 2021-11-13 NOTE — TOC Transition Note (Addendum)
Transition of Care [facility]) - CM/SW Discharge Note   Patient Details  Name: Jacob Bolton MRN: [id number] Date of Birth: 1941-09-07  [facility] CM/SW Contact:  Ross Ludwig, LCSW Phone Number: 11/13/2021, 11:58 AM   Clinical Narrative:     Patient and family are declining SNF placement and will be going home with home health PT.  Patient will be going home with home health PT through [facility].  CSW signing off please reconsult with any other social work needs, home health agency has been notified of planned discharge.    Final next level of care: [place] Barriers to Discharge: Barriers Resolved   Patient Goals and CMS Choice Patient states their goals for this hospitalization and ongoing recovery are:: To return back home with wife. CMS Medicare.gov Compare Post Acute Care list provided to:: Patient Represenative (must comment) Choice offered to / list presented to : Spouse  Discharge Placement                       Discharge Plan and Services                          HH Arranged: PT [facility] Agency: [facility] Date [facility] Agency Contacted: 11/13/21 Time [place]: [id number] Representative spoke with at [place]: [place] ([place]) Interventions     Readmission Risk Interventions No flowsheet data found.

## 2021-11-13 NOTE — Progress Notes (Signed)
Pt alert and oriented. D/C instructions given, pt d/cd home. 

## 2021-11-13 NOTE — Discharge Summary (Signed)
Physician Discharge Summary   Patient name: Jacob Bolton  Admit date:     11/10/2021  Discharge date: 11/13/2021  Discharge Physician: Dwyane Dee   PCP: Kristen Loader, FNP   Recommendations at discharge: Follow up with orthopedic surgery  Discharge Diagnoses Principal Problem:   UTI (urinary tract infection) due to urinary indwelling catheter ([place]) Active Problems:   Cellulitis   Edema of right lower extremity   Soft tissue sarcoma of thigh, right (HCC)   Parkinson disease (HCC)   HTN (hypertension)   Resolved Diagnoses Resolved Problems:   * No resolved hospital problems. [facility]   Jacob Bolton is a 80 yo male with PMH Parkinson's disease, chronic urinary retention with indwelling Foley catheter, recurrent UTIs, HTN, right thigh soft tissue sarcoma who presented to the ED for evaluation of right lower extremity pain and swelling.    Right lower extremity Doppler negative for DVT; showing multiple masses in the proximal to mid thigh and enlarged lymph nodes in the groin.   UA with positive nitrite, large amount of leukocytes, greater than 50 WBCs, and many bacteria.  Patient was given ceftriaxone for UTI and right lower extremity cellulitis. Patient is not sure when his Foley catheter was last changed.    Patient reports 67-month history of right leg swelling.  Sometimes he experiences pain in his upper thigh.  No numbness or tingling in his leg.  He is having difficulty walking.  He is seen by Dr. Redmond Pulling at [facility] for right thigh sarcoma.  States he was recently sent for an MRI which could not be done as during the first attempt he was not able to lay flat and during the second attempt he felt claustrophobic.   He was started on Rocephin for presumed UTI and admitted for further work-up and MRI of right thigh.  He was unable to again tolerate MRI but scout images showed large underlying sarcoma. Wife informed and outpatient follow-up with his  surgeon is planned.   Urine culture grew Klebsiella oxytoca and he was de-escalated from Rocephin down to cefadroxil at discharge to complete course.   * UTI (urinary tract infection) due to urinary indwelling catheter ([place]) - foley replaced on admission - follow up cultures; growing Kleb oxytoca  - continue Rocephin while in hospital.  Discharged with cefadroxil to complete 7-day course total  Edema of right lower extremity - follow up RLE MRI (appears wasn't able to tolerate due to pain,unsure if part of this study was able to be done before aborted) - RLE duplex negative for DVT but shows multiple masses in the right proximal to mid thigh with largest mass measuring 3.89 x 7.57 cm and vascularized (not present on prior duplex performed 06/30/2021) - suspect edema and erythema due to underlying pathologic process  Soft tissue sarcoma of thigh, right ([place])  - follows with Dr. Redmond Pulling at [facility]  - 06/06/17: Radical resection of deep soft tissue sarcoma, right thigh.  - 02/05/21: Radical resection of soft tissue tumor, right thigh >8cm due to impingement on sciatic nerve   - Has been planned for MRI but unable to be completed outpatient due to pain - again unable to complete on 11/13; scout images showed large mass measuring 13.6 x 16.6 x 21.0 cm  - has been on Neurontin and utilizing cane and PT - continue PT inpatient (SNF recommended); discussed with patient and his wife, SNF declined and patient wishes to return home with home health at  time of discharge - outpatient followup with Dr. Redmond Pulling   HTN (hypertension) - continue current regimen  Parkinson disease ([place]) - continue home meds - slowed mentation noted       Condition at discharge: stable  Exam Physical Exam Constitutional:      General: He is not in acute distress.    Appearance: Normal appearance. He is not ill-appearing.     Comments: Slowed mentation   HENT:     Head: Normocephalic and atraumatic.     Mouth/Throat:      Mouth: Mucous membranes are moist.  Eyes:     Extraocular Movements: Extraocular movements intact.  Cardiovascular:     Rate and Rhythm: Normal rate and regular rhythm.  Pulmonary:     Effort: Pulmonary effort is normal. No respiratory distress.     Breath sounds: Normal breath sounds. No wheezing.  Abdominal:     General: Bowel sounds are normal. There is no distension.     Palpations: Abdomen is soft.     Tenderness: There is no abdominal tenderness.  Musculoskeletal:        General: Normal range of motion.     Cervical back: Normal range of motion and neck supple.     Comments: Passive ROM intact with RLE; notable edema in right thigh and leg 2+. Palpable right upper thigh mass  Skin:    Comments: Mild RLE stasis changes; no obvious erythema or lesions  Neurological:     Mental Status: Mental status is at baseline.     Comments: Slowed mentation; baseline  Psychiatric:        Mood and Affect: Mood normal.        Behavior: Behavior normal.        Thought Content: Thought content normal.     Disposition: Home  Discharge time: greater than 30 minutes.  Follow-up [facility] Follow up.   Why: [facility] will contact you to set up home health appointment. Contact information: [phone]                Allergies as of 11/13/2021       Reactions   Donepezil Other (See Comments)   Nightmares (only if taken at night, its ok in the morning)   Sulfamethoxazole    Other reaction(s): Other (See Comments) Childhood allergy        Medication List     TAKE these medications    acetaminophen 500 MG tablet Commonly known as: TYLENOL Take 500 mg by mouth every 6 (six) hours as needed for moderate pain.   Azelastine HCl 0.15 % Soln Place 1 spray into the nose in the morning and at bedtime.   carbidopa-levodopa 25-250 MG tablet Commonly known as: Sinemet Take 1 tablet by mouth 3 (three) times daily.   cefadroxil 500 MG  capsule Commonly known as: DURICEF Take 2 capsules (1,000 mg total) by mouth 2 (two) times daily for 3 days.   Cholecalciferol 25 MCG (1000 UT) tablet Take 1,000 Units by mouth daily.   Cranberry Extract 250 MG Tabs Take 250 mg by mouth 3 (three) times daily.   donepezil 5 MG tablet Commonly known as: ARICEPT Take 1 tablet (5 mg total) by mouth daily after breakfast.   fluticasone 50 MCG/ACT nasal spray Commonly known as: FLONASE Place 1 spray into both nostrils in the morning and at bedtime.   gabapentin 300 MG capsule Commonly known as: NEURONTIN Take 300 mg by mouth 3 (three)  times daily.   losartan-hydrochlorothiazide 100-12.5 MG tablet Commonly known as: HYZAAR Take 1 tablet by mouth daily.   oxyCODONE 5 MG immediate release tablet Commonly known as: Oxy IR/ROXICODONE Take 1 tablet (5 mg total) by mouth every 6 (six) hours as needed for severe pain or breakthrough pain.   pramipexole 1 MG tablet Commonly known as: MIRAPEX TAKE 1 TABLET BY MOUTH THREE TIMES A DAY   terazosin 10 MG capsule Commonly known as: HYTRIN Take 10 mg by mouth daily.   Vitamin C 500 MG Caps Take 500 mg by mouth 3 (three) times daily.        MR [facility] RIGHT WO CONTRAST  Result Date: 11/11/2021 CLINICAL DATA:  Right thigh mass. EXAM: MRI OF THE RIGHT FEMUR WITHOUT CONTRAST TECHNIQUE: MR imaging of the right femur was performed. No intravenous contrast was administered. COMPARISON:  None. FINDINGS: Only scout images were obtained as the patient could not tolerate further imaging. Large heterogeneous mass in the upper medial right thigh measuring approximately 13.6 x 16.6 x 21.0 cm (AP by transverse by CC) IMPRESSION: 1. Incomplete study. Only scout images were obtained. Large heterogeneous mass in the upper medial right thigh measuring approximately 13.6 x 16.6 x 21.0 cm, concerning for sarcoma. Electronically Signed   By: Titus Dubin M.D.   On: 11/11/2021 14:31   VAS [place] LOWER EXTREMITY  VENOUS (DVT) (ONLY [facility] & [facility])  Result Date: 11/11/2021  Lower Venous DVT Study Patient Name:  Hosie SABIR CHARTERS  Date of Exam:   11/10/2021 Medical Rec #: [id number]      Accession #:    [id number] Date of Birth: Oct 15, 1941      Patient Gender: M Patient Age:   80 years Exam Location:  [facility] Procedure:      VAS [place] LOWER EXTREMITY VENOUS (DVT) Referring Phys: RILEY RANSOM --------------------------------------------------------------------------------  Indications: Edema, and History of recurrent right thigh liposarcoma.  Risk Factors: Cancer Liposarcoma with multiple surgeries to remove tumors. Limitations: Multiple masses is proximal to mid thigh, massive, doughy edema. Comparison Study: Prior negative right LEV done 06/30/21 Performing Technologist: Sharion Dove RVS  Examination Guidelines: A complete evaluation includes B-mode imaging, spectral Doppler, color Doppler, and power Doppler as needed of all accessible portions of each vessel. Bilateral testing is considered an integral part of a complete examination. Limited examinations for reoccurring indications may be performed as noted. The reflux portion of the exam is performed with the patient in reverse Trendelenburg.  +---------+---------------+---------+-----------+----------+-------------------+ RIGHT    CompressibilityPhasicitySpontaneityPropertiesThrombus Aging      +---------+---------------+---------+-----------+----------+-------------------+ CFV                                                   Not well visualized +---------+---------------+---------+-----------+----------+-------------------+ SFJ                                                   Not well visualized +---------+---------------+---------+-----------+----------+-------------------+ FV Prox                 Yes      Yes                  patent by color and  Doppler              +---------+---------------+---------+-----------+----------+-------------------+ FV Mid                  Yes      Yes                  patent by color and                                                       Doppler             +---------+---------------+---------+-----------+----------+-------------------+ FV Distal               Yes      Yes                  patent by color and                                                       Doppler             +---------+---------------+---------+-----------+----------+-------------------+ PFV                                                   Not well visualized +---------+---------------+---------+-----------+----------+-------------------+ POP                     Yes      Yes                  patent by color and                                                       Doppler             +---------+---------------+---------+-----------+----------+-------------------+ PTV      Full                                                             +---------+---------------+---------+-----------+----------+-------------------+ PERO     Full                                                             +---------+---------------+---------+-----------+----------+-------------------+   +----+---------------+---------+-----------+----------+--------------+ LEFTCompressibilityPhasicitySpontaneityPropertiesThrombus Aging +----+---------------+---------+-----------+----------+--------------+ CFV Full           Yes      Yes                                 +----+---------------+---------+-----------+----------+--------------+  Summary: RIGHT: - Venous Findings appear essentially unchanged compared to previous examination. - There is no evidence of deep vein thrombosis in the lower extremity. However, portions of this examination were limited- see technologist comments above.  - Multiple masses noted in the right  proximal to mid thigh. Largest mass measures 3.89 cm X 7.57 cm and is vascularized. This large mass was not present on prior right LEV done 06/30/21 and right lower extremity venous duplex study.  LEFT: - Ultrasound characteristics of enlarged lymph nodes noted in the groin.  *See table(s) above for measurements and observations. Electronically signed by Servando Snare MD on 11/11/2021 at 9:10:12 AM.    Final    Results for orders placed or performed during the hospital encounter of 11/10/21  Urine Culture     Status: Abnormal (Preliminary result)   Collection Time: 11/10/21  7:50 PM   Specimen: Urine, Clean Catch  Result Value Ref Range Status   Specimen Description   Final    URINE, CLEAN CATCH Performed at [facility], [place] [address] [place], [place] [address]    Special Requests   Final    NONE Performed at [facility], [place] [address], [place], [place] [address]    Culture (A)  Final    >=100,000 COLONIES/mL KLEBSIELLA OXYTOCA 80,000 COLONIES/mL ESCHERICHIA COLI SUSCEPTIBILITIES TO FOLLOW Performed at [facility], [place] [address], [place], [place] [address]    Report Status PENDING  Incomplete   Organism ID, Bacteria KLEBSIELLA OXYTOCA (A)  Final      Susceptibility   Klebsiella oxytoca - MIC*    AMPICILLIN >=32 RESISTANT Resistant     CEFAZOLIN 8 SENSITIVE Sensitive     CEFEPIME <=0.12 SENSITIVE Sensitive     CEFTRIAXONE <=0.25 SENSITIVE Sensitive     CIPROFLOXACIN <=0.25 SENSITIVE Sensitive     GENTAMICIN <=1 SENSITIVE Sensitive     IMIPENEM <=0.25 SENSITIVE Sensitive     NITROFURANTOIN 32 SENSITIVE Sensitive     TRIMETH/SULFA <=20 SENSITIVE Sensitive     AMPICILLIN/SULBACTAM 16 INTERMEDIATE Intermediate     PIP/TAZO <=4 SENSITIVE Sensitive     * >=100,000 COLONIES/mL KLEBSIELLA OXYTOCA  Resp Panel by RT-PCR (Flu A&B, Covid) Nasopharyngeal Swab     Status: None   Collection Time: 11/10/21  8:08 PM   Specimen: Nasopharyngeal Swab;  Nasopharyngeal(NP) swabs in vial transport medium  Result Value Ref Range Status   SARS Coronavirus 2 by RT PCR NEGATIVE NEGATIVE Final    Comment: (NOTE) SARS-CoV-2 target nucleic acids are NOT DETECTED.  The SARS-CoV-2 RNA is generally detectable in upper respiratory specimens during the acute phase of infection. The lowest concentration of SARS-CoV-2 viral copies this assay can detect is 138 copies/mL. A negative result does not preclude SARS-Cov-2 infection and should not be used as the sole basis for treatment or other patient management decisions. A negative result may occur with  improper specimen collection/handling, submission of specimen other than nasopharyngeal swab, presence of viral mutation(s) within the areas targeted by this assay, and inadequate number of viral copies(<138 copies/mL). A negative result must be combined with clinical observations, patient history, and epidemiological information. The expected result is Negative.  Fact Sheet for Patients:  EntrepreneurPulse.com.au  Fact Sheet for Healthcare Providers:  IncredibleEmployment.be  This test is no t yet approved or cleared by the [place] FDA and  has been authorized for detection and/or diagnosis of SARS-CoV-2 by FDA under an Emergency Use Authorization (EUA). This EUA will remain  in effect (meaning this test can be used) for the duration of the COVID-19 declaration under Section 564(b)(1) of the Act, 21 U.S.C.section 360bbb-3(b)(1), unless the authorization is terminated  or revoked sooner.       Influenza A by PCR NEGATIVE NEGATIVE Final   Influenza B by PCR NEGATIVE NEGATIVE Final    Comment: (NOTE) The Xpert Xpress SARS-CoV-2/FLU/RSV plus assay is intended as an aid in the diagnosis of influenza from Nasopharyngeal swab specimens and should not be used as a sole basis for treatment. Nasal washings and aspirates are unacceptable for Xpert Xpress  SARS-CoV-2/FLU/RSV testing.  Fact Sheet for Patients: EntrepreneurPulse.com.au  Fact Sheet for Healthcare Providers: IncredibleEmployment.be  This test is not yet approved or cleared by the [place] FDA and has been authorized for detection and/or diagnosis of SARS-CoV-2 by FDA under an Emergency Use Authorization (EUA). This EUA will remain in effect (meaning this test can be used) for the duration of the COVID-19 declaration under Section 564(b)(1) of the Act, 21 U.S.C. section 360bbb-3(b)(1), unless the authorization is terminated or revoked.  Performed at [facility], [place] [address] [place], [place] [address]     Signed:  Dwyane Dee MD.  [facility] 11/13/2021, 2:06 PM

## 2021-11-14 LAB — URINE CULTURE: Culture: >=100000 — AB

## 2021-11-17 DIAGNOSIS — Z981 Arthrodesis status: Secondary | ICD-10-CM | POA: Diagnosis not present

## 2021-11-17 DIAGNOSIS — G20 Parkinson's disease: Secondary | ICD-10-CM | POA: Diagnosis not present

## 2021-11-17 DIAGNOSIS — Z87440 Personal history of urinary (tract) infections: Secondary | ICD-10-CM | POA: Diagnosis not present

## 2021-11-17 DIAGNOSIS — C499 Malignant neoplasm of connective and soft tissue, unspecified: Secondary | ICD-10-CM | POA: Diagnosis not present

## 2021-11-17 DIAGNOSIS — Z9181 History of falling: Secondary | ICD-10-CM | POA: Diagnosis not present

## 2021-11-17 DIAGNOSIS — R600 Localized edema: Secondary | ICD-10-CM | POA: Diagnosis not present

## 2021-11-17 DIAGNOSIS — I10 Essential (primary) hypertension: Secondary | ICD-10-CM | POA: Diagnosis not present

## 2021-11-17 DIAGNOSIS — R338 Other retention of urine: Secondary | ICD-10-CM | POA: Diagnosis not present

## 2021-11-17 DIAGNOSIS — N401 Enlarged prostate with lower urinary tract symptoms: Secondary | ICD-10-CM | POA: Diagnosis not present

## 2021-11-17 DIAGNOSIS — Z7951 Long term (current) use of inhaled steroids: Secondary | ICD-10-CM | POA: Diagnosis not present

## 2021-11-17 DIAGNOSIS — N179 Acute kidney failure, unspecified: Secondary | ICD-10-CM | POA: Diagnosis not present

## 2021-11-17 DIAGNOSIS — L03115 Cellulitis of right lower limb: Secondary | ICD-10-CM | POA: Diagnosis not present

## 2021-11-17 DIAGNOSIS — Z466 Encounter for fitting and adjustment of urinary device: Secondary | ICD-10-CM | POA: Diagnosis not present

## 2021-11-27 DIAGNOSIS — R338 Other retention of urine: Secondary | ICD-10-CM | POA: Diagnosis not present

## 2021-11-30 DIAGNOSIS — Z466 Encounter for fitting and adjustment of urinary device: Secondary | ICD-10-CM | POA: Diagnosis not present

## 2021-11-30 DIAGNOSIS — Z87440 Personal history of urinary (tract) infections: Secondary | ICD-10-CM | POA: Diagnosis not present

## 2021-11-30 DIAGNOSIS — N179 Acute kidney failure, unspecified: Secondary | ICD-10-CM | POA: Diagnosis not present

## 2021-11-30 DIAGNOSIS — I10 Essential (primary) hypertension: Secondary | ICD-10-CM | POA: Diagnosis not present

## 2021-11-30 DIAGNOSIS — G20 Parkinson's disease: Secondary | ICD-10-CM | POA: Diagnosis not present

## 2021-11-30 DIAGNOSIS — L03115 Cellulitis of right lower limb: Secondary | ICD-10-CM | POA: Diagnosis not present

## 2021-11-30 DIAGNOSIS — Z981 Arthrodesis status: Secondary | ICD-10-CM | POA: Diagnosis not present

## 2021-11-30 DIAGNOSIS — Z9181 History of falling: Secondary | ICD-10-CM | POA: Diagnosis not present

## 2021-11-30 DIAGNOSIS — R600 Localized edema: Secondary | ICD-10-CM | POA: Diagnosis not present

## 2021-11-30 DIAGNOSIS — R338 Other retention of urine: Secondary | ICD-10-CM | POA: Diagnosis not present

## 2021-11-30 DIAGNOSIS — C499 Malignant neoplasm of connective and soft tissue, unspecified: Secondary | ICD-10-CM | POA: Diagnosis not present

## 2021-11-30 DIAGNOSIS — N401 Enlarged prostate with lower urinary tract symptoms: Secondary | ICD-10-CM | POA: Diagnosis not present

## 2021-11-30 DIAGNOSIS — Z7951 Long term (current) use of inhaled steroids: Secondary | ICD-10-CM | POA: Diagnosis not present

## 2021-12-06 DIAGNOSIS — R300 Dysuria: Secondary | ICD-10-CM | POA: Diagnosis not present

## 2021-12-06 DIAGNOSIS — N401 Enlarged prostate with lower urinary tract symptoms: Secondary | ICD-10-CM | POA: Diagnosis not present

## 2021-12-13 DIAGNOSIS — G20 Parkinson's disease: Secondary | ICD-10-CM | POA: Diagnosis not present

## 2021-12-13 DIAGNOSIS — N3000 Acute cystitis without hematuria: Secondary | ICD-10-CM | POA: Diagnosis not present

## 2021-12-13 DIAGNOSIS — R338 Other retention of urine: Secondary | ICD-10-CM | POA: Diagnosis not present

## 2021-12-19 DIAGNOSIS — N400 Enlarged prostate without lower urinary tract symptoms: Secondary | ICD-10-CM | POA: Diagnosis not present

## 2021-12-19 DIAGNOSIS — E782 Mixed hyperlipidemia: Secondary | ICD-10-CM | POA: Diagnosis not present

## 2021-12-19 DIAGNOSIS — G20 Parkinson's disease: Secondary | ICD-10-CM | POA: Diagnosis not present

## 2021-12-19 DIAGNOSIS — I10 Essential (primary) hypertension: Secondary | ICD-10-CM | POA: Diagnosis not present

## 2021-12-20 DIAGNOSIS — M79604 Pain in right leg: Secondary | ICD-10-CM | POA: Diagnosis not present

## 2021-12-20 DIAGNOSIS — M7989 Other specified soft tissue disorders: Secondary | ICD-10-CM | POA: Diagnosis not present

## 2021-12-27 ENCOUNTER — Telehealth: Payer: Self-pay | Admitting: Adult Health

## 2021-12-27 NOTE — Telephone Encounter (Signed)
Pt's wife Arbie Cookey called states her husband is having trouble walking, she is not sure if his meds need to be adjusted. Arbie Cookey is requesting a call back.

## 2021-12-27 NOTE — Telephone Encounter (Signed)
Contacted pt wife back,  went over jessica's recommendations with her. She stated she did speak with therapy and they will get started after his surgery. She was appreciative for the call back and ease of her mind.

## 2021-12-27 NOTE — Telephone Encounter (Signed)
He was hospitalized last month for UTI and right leg issues - PT recommended SNF but patient/wife declined. His right leg mas is likely largely contributing to gait difficulties and possible some deconditioning with recent admission. Please ensure he is following up with his surgeon regarding right thigh mass - can discuss possible benefit of therapy. Would not recommend increasing dose of Sinemet at this time as other factors are likely contributing to gait hence Sinemet would likely not be beneficial.

## 2021-12-27 NOTE — Telephone Encounter (Signed)
Suggestions?

## 2022-01-01 DIAGNOSIS — C4921 Malignant neoplasm of connective and soft tissue of right lower limb, including hip: Secondary | ICD-10-CM | POA: Diagnosis not present

## 2022-01-01 DIAGNOSIS — M898X8 Other specified disorders of bone, other site: Secondary | ICD-10-CM | POA: Diagnosis not present

## 2022-01-04 DIAGNOSIS — R338 Other retention of urine: Secondary | ICD-10-CM | POA: Diagnosis not present

## 2022-01-04 DIAGNOSIS — N3000 Acute cystitis without hematuria: Secondary | ICD-10-CM | POA: Diagnosis not present

## 2022-01-04 DIAGNOSIS — R8279 Other abnormal findings on microbiological examination of urine: Secondary | ICD-10-CM | POA: Diagnosis not present

## 2022-01-06 ENCOUNTER — Other Ambulatory Visit: Payer: Self-pay | Admitting: Neurology

## 2022-01-07 ENCOUNTER — Telehealth: Payer: Self-pay | Admitting: Adult Health

## 2022-01-07 NOTE — Telephone Encounter (Signed)
Pt's wife called wanting to know if there could be some test ran on her husband to see if he has neuropathy. Pt's wife requesting a call back.

## 2022-01-07 NOTE — Telephone Encounter (Signed)
Contacted pt wife back, informed her that we do nerve conductions here but his PCP will have to place a referral for that issue to be seen for it. She understood and was appreciative.

## 2022-01-08 DIAGNOSIS — C4921 Malignant neoplasm of connective and soft tissue of right lower limb, including hip: Secondary | ICD-10-CM | POA: Diagnosis not present

## 2022-01-08 DIAGNOSIS — M5431 Sciatica, right side: Secondary | ICD-10-CM | POA: Diagnosis not present

## 2022-01-15 DIAGNOSIS — C4921 Malignant neoplasm of connective and soft tissue of right lower limb, including hip: Secondary | ICD-10-CM | POA: Diagnosis not present

## 2022-01-25 DIAGNOSIS — C4921 Malignant neoplasm of connective and soft tissue of right lower limb, including hip: Secondary | ICD-10-CM | POA: Diagnosis not present

## 2022-01-29 DIAGNOSIS — J159 Unspecified bacterial pneumonia: Secondary | ICD-10-CM | POA: Diagnosis not present

## 2022-01-29 DIAGNOSIS — J189 Pneumonia, unspecified organism: Secondary | ICD-10-CM | POA: Diagnosis not present

## 2022-01-29 DIAGNOSIS — G20 Parkinson's disease: Secondary | ICD-10-CM | POA: Diagnosis not present

## 2022-01-29 DIAGNOSIS — I700 Atherosclerosis of aorta: Secondary | ICD-10-CM | POA: Diagnosis not present

## 2022-01-29 DIAGNOSIS — Z20822 Contact with and (suspected) exposure to covid-19: Secondary | ICD-10-CM | POA: Diagnosis not present

## 2022-01-29 DIAGNOSIS — F918 Other conduct disorders: Secondary | ICD-10-CM | POA: Diagnosis not present

## 2022-01-29 DIAGNOSIS — R918 Other nonspecific abnormal finding of lung field: Secondary | ICD-10-CM | POA: Diagnosis not present

## 2022-01-29 DIAGNOSIS — R531 Weakness: Secondary | ICD-10-CM | POA: Diagnosis not present

## 2022-01-29 DIAGNOSIS — F0280 Dementia in other diseases classified elsewhere without behavioral disturbance: Secondary | ICD-10-CM | POA: Diagnosis not present

## 2022-01-29 DIAGNOSIS — J479 Bronchiectasis, uncomplicated: Secondary | ICD-10-CM | POA: Diagnosis not present

## 2022-01-29 DIAGNOSIS — R911 Solitary pulmonary nodule: Secondary | ICD-10-CM | POA: Diagnosis not present

## 2022-01-29 DIAGNOSIS — M6281 Muscle weakness (generalized): Secondary | ICD-10-CM | POA: Diagnosis not present

## 2022-01-29 DIAGNOSIS — R1312 Dysphagia, oropharyngeal phase: Secondary | ICD-10-CM | POA: Diagnosis not present

## 2022-01-29 DIAGNOSIS — Z66 Do not resuscitate: Secondary | ICD-10-CM | POA: Diagnosis not present

## 2022-01-29 DIAGNOSIS — N400 Enlarged prostate without lower urinary tract symptoms: Secondary | ICD-10-CM | POA: Diagnosis not present

## 2022-01-29 DIAGNOSIS — Z960 Presence of urogenital implants: Secondary | ICD-10-CM | POA: Diagnosis not present

## 2022-01-29 DIAGNOSIS — C4921 Malignant neoplasm of connective and soft tissue of right lower limb, including hip: Secondary | ICD-10-CM | POA: Diagnosis not present

## 2022-01-29 DIAGNOSIS — G8929 Other chronic pain: Secondary | ICD-10-CM | POA: Diagnosis not present

## 2022-01-29 DIAGNOSIS — I10 Essential (primary) hypertension: Secondary | ICD-10-CM | POA: Diagnosis not present

## 2022-01-29 DIAGNOSIS — C7651 Malignant neoplasm of right lower limb: Secondary | ICD-10-CM | POA: Diagnosis not present

## 2022-01-29 DIAGNOSIS — M7989 Other specified soft tissue disorders: Secondary | ICD-10-CM | POA: Diagnosis not present

## 2022-01-29 DIAGNOSIS — E872 Acidosis, unspecified: Secondary | ICD-10-CM | POA: Diagnosis not present

## 2022-01-29 DIAGNOSIS — T83511A Infection and inflammatory reaction due to indwelling urethral catheter, initial encounter: Secondary | ICD-10-CM | POA: Diagnosis not present

## 2022-01-29 DIAGNOSIS — R197 Diarrhea, unspecified: Secondary | ICD-10-CM | POA: Diagnosis not present

## 2022-01-29 DIAGNOSIS — R9431 Abnormal electrocardiogram [ECG] [EKG]: Secondary | ICD-10-CM | POA: Diagnosis not present

## 2022-01-29 DIAGNOSIS — E8729 Other acidosis: Secondary | ICD-10-CM | POA: Diagnosis not present

## 2022-01-29 DIAGNOSIS — J90 Pleural effusion, not elsewhere classified: Secondary | ICD-10-CM | POA: Diagnosis not present

## 2022-01-29 DIAGNOSIS — C787 Secondary malignant neoplasm of liver and intrahepatic bile duct: Secondary | ICD-10-CM | POA: Diagnosis not present

## 2022-01-29 DIAGNOSIS — R600 Localized edema: Secondary | ICD-10-CM | POA: Diagnosis not present

## 2022-01-29 DIAGNOSIS — E782 Mixed hyperlipidemia: Secondary | ICD-10-CM | POA: Diagnosis not present

## 2022-01-29 DIAGNOSIS — Z743 Need for continuous supervision: Secondary | ICD-10-CM | POA: Diagnosis not present

## 2022-01-29 DIAGNOSIS — J181 Lobar pneumonia, unspecified organism: Secondary | ICD-10-CM | POA: Diagnosis not present

## 2022-01-29 DIAGNOSIS — F0390 Unspecified dementia without behavioral disturbance: Secondary | ICD-10-CM | POA: Diagnosis not present

## 2022-01-29 DIAGNOSIS — N390 Urinary tract infection, site not specified: Secondary | ICD-10-CM | POA: Diagnosis not present

## 2022-01-29 DIAGNOSIS — C499 Malignant neoplasm of connective and soft tissue, unspecified: Secondary | ICD-10-CM | POA: Diagnosis not present

## 2022-01-29 DIAGNOSIS — R410 Disorientation, unspecified: Secondary | ICD-10-CM | POA: Diagnosis not present

## 2022-01-31 ENCOUNTER — Emergency Department (HOSPITAL_COMMUNITY)
Admission: EM | Admit: 2022-01-31 | Discharge: 2022-02-01 | Disposition: A | Discharge status: Other Acute Inpatient Hospital | Payer: PPO | Attending: Emergency Medicine | Admitting: Emergency Medicine

## 2022-01-31 ENCOUNTER — Emergency Department (HOSPITAL_COMMUNITY): Payer: PPO

## 2022-01-31 DIAGNOSIS — R197 Diarrhea, unspecified: Secondary | ICD-10-CM | POA: Diagnosis not present

## 2022-01-31 DIAGNOSIS — J181 Lobar pneumonia, unspecified organism: Secondary | ICD-10-CM | POA: Insufficient documentation

## 2022-01-31 DIAGNOSIS — J189 Pneumonia, unspecified organism: Secondary | ICD-10-CM

## 2022-01-31 DIAGNOSIS — I700 Atherosclerosis of aorta: Secondary | ICD-10-CM | POA: Diagnosis not present

## 2022-01-31 DIAGNOSIS — C7989 Secondary malignant neoplasm of other specified sites: Secondary | ICD-10-CM

## 2022-01-31 DIAGNOSIS — Z20822 Contact with and (suspected) exposure to covid-19: Secondary | ICD-10-CM | POA: Insufficient documentation

## 2022-01-31 DIAGNOSIS — J479 Bronchiectasis, uncomplicated: Secondary | ICD-10-CM | POA: Diagnosis not present

## 2022-01-31 DIAGNOSIS — C499 Malignant neoplasm of connective and soft tissue, unspecified: Secondary | ICD-10-CM | POA: Diagnosis present

## 2022-01-31 DIAGNOSIS — G20A1 Parkinson's disease without dyskinesia, without mention of fluctuations: Secondary | ICD-10-CM | POA: Diagnosis present

## 2022-01-31 DIAGNOSIS — R911 Solitary pulmonary nodule: Secondary | ICD-10-CM | POA: Diagnosis not present

## 2022-01-31 DIAGNOSIS — R918 Other nonspecific abnormal finding of lung field: Secondary | ICD-10-CM | POA: Diagnosis not present

## 2022-01-31 DIAGNOSIS — R9431 Abnormal electrocardiogram [ECG] [EKG]: Secondary | ICD-10-CM | POA: Diagnosis not present

## 2022-01-31 DIAGNOSIS — I10 Essential (primary) hypertension: Secondary | ICD-10-CM | POA: Diagnosis present

## 2022-01-31 DIAGNOSIS — N401 Enlarged prostate with lower urinary tract symptoms: Secondary | ICD-10-CM | POA: Diagnosis present

## 2022-01-31 DIAGNOSIS — M7989 Other specified soft tissue disorders: Secondary | ICD-10-CM | POA: Insufficient documentation

## 2022-01-31 DIAGNOSIS — R338 Other retention of urine: Secondary | ICD-10-CM | POA: Diagnosis present

## 2022-01-31 DIAGNOSIS — J90 Pleural effusion, not elsewhere classified: Secondary | ICD-10-CM | POA: Diagnosis not present

## 2022-01-31 DIAGNOSIS — G20 Parkinson's disease: Secondary | ICD-10-CM | POA: Diagnosis not present

## 2022-01-31 DIAGNOSIS — R531 Weakness: Secondary | ICD-10-CM | POA: Diagnosis not present

## 2022-01-31 LAB — CBC WITH DIFFERENTIAL/PLATELET
Abs Immature Granulocytes: 0.08 10*3/uL — ABNORMAL HIGH (ref 0.00–0.07)
Basophils Absolute: 0.1 10*3/uL (ref 0.0–0.1)
Basophils Relative: 1 %
Eosinophils Absolute: 2.3 10*3/uL — ABNORMAL HIGH (ref 0.0–0.5)
Eosinophils Relative: 13 %
HCT: 33.6 % — ABNORMAL LOW (ref 39.0–52.0)
Hemoglobin: 10.6 g/dL — ABNORMAL LOW (ref 13.0–17.0)
Immature Granulocytes: 1 %
Lymphocytes Relative: 7 %
Lymphs Abs: 1.2 10*3/uL (ref 0.7–4.0)
MCH: 29.1 pg (ref 26.0–34.0)
MCHC: 31.5 g/dL (ref 30.0–36.0)
MCV: 92.3 fL (ref 80.0–100.0)
Monocytes Absolute: 1.4 10*3/uL — ABNORMAL HIGH (ref 0.1–1.0)
Monocytes Relative: 8 %
Neutro Abs: 11.9 10*3/uL — ABNORMAL HIGH (ref 1.7–7.7)
Neutrophils Relative %: 70 %
Platelets: 172 10*3/uL (ref 150–400)
RBC: 3.64 MIL/uL — ABNORMAL LOW (ref 4.22–5.81)
RDW: 14.6 % (ref 11.5–15.5)
WBC: 17.1 10*3/uL — ABNORMAL HIGH (ref 4.0–10.5)
nRBC: 0 % (ref 0.0–0.2)

## 2022-01-31 LAB — COMPREHENSIVE METABOLIC PANEL
ALT: 5 U/L (ref 0–44)
AST: 21 U/L (ref 15–41)
Albumin: 3 g/dL — ABNORMAL LOW (ref 3.5–5.0)
Alkaline Phosphatase: 51 U/L (ref 38–126)
Anion gap: 13 (ref 5–15)
BUN: 31 mg/dL — ABNORMAL HIGH (ref 8–23)
CO2: 23 mmol/L (ref 22–32)
Calcium: 9.1 mg/dL (ref 8.9–10.3)
Chloride: 103 mmol/L (ref 98–111)
Creatinine, Ser: 0.81 mg/dL (ref 0.61–1.24)
GFR, Estimated: >60 mL/min (ref >60)
Glucose, Bld: 89 mg/dL (ref 70–99)
Potassium: 3.8 mmol/L (ref 3.5–5.1)
Sodium: 139 mmol/L (ref 135–145)
Total Bilirubin: 0.7 mg/dL (ref 0.3–1.2)
Total Protein: 6.2 g/dL — ABNORMAL LOW (ref 6.5–8.1)

## 2022-01-31 LAB — URINALYSIS, ROUTINE W REFLEX MICROSCOPIC
Bacteria, UA: NONE SEEN
Bilirubin Urine: NEGATIVE
Glucose, UA: NEGATIVE mg/dL
Hgb urine dipstick: NEGATIVE
Ketones, ur: 15 mg/dL — AB
Nitrite: POSITIVE — AB
Protein, ur: NEGATIVE mg/dL
Specific Gravity, Urine: 1.015 (ref 1.005–1.030)
WBC, UA: >50 WBC/hpf — ABNORMAL HIGH (ref 0–5)
pH: 5.5 (ref 5.0–8.0)

## 2022-01-31 LAB — LACTIC ACID, PLASMA
Lactic Acid, Venous: 3 mmol/L (ref 0.5–1.9)
Lactic Acid, Venous: 2.9 mmol/L (ref 0.5–1.9)

## 2022-01-31 LAB — RESP PANEL BY RT-PCR (FLU A&B, COVID) ARPGX2
Influenza A by PCR: NEGATIVE
Influenza B by PCR: NEGATIVE
SARS Coronavirus 2 by RT PCR: NEGATIVE

## 2022-01-31 MED ORDER — SODIUM CHLORIDE 0.9 % IV SOLN: 500.0000 mg | INTRAVENOUS | Status: DC

## 2022-01-31 MED ORDER — DONEPEZIL HCL 5 MG PO TABS: 5.0000 mg | ORAL_TABLET | Freq: Every day | ORAL | Status: DC

## 2022-01-31 MED ORDER — PRAMIPEXOLE DIHYDROCHLORIDE 0.125 MG PO TABS
0.1250 mg | ORAL_TABLET | Freq: Three times a day (TID) | ORAL | Status: DC
  Administered 2022-01-31 – 2022-02-01 (×3): 0.125 mg via ORAL
  Filled 2022-01-31 (×4): qty 1

## 2022-01-31 MED ORDER — ENOXAPARIN SODIUM 40 MG/0.4ML IJ SOSY
40.0000 mg | PREFILLED_SYRINGE | INTRAMUSCULAR | Status: DC
  Administered 2022-02-01: 40 mg via SUBCUTANEOUS
  Filled 2022-01-31: qty 0.4

## 2022-01-31 MED ORDER — SODIUM CHLORIDE 0.9 % IV BOLUS
2700.0000 mL | Freq: Once | INTRAVENOUS | Status: AC
  Administered 2022-01-31: 2700 mL via INTRAVENOUS

## 2022-01-31 MED ORDER — SODIUM CHLORIDE 0.9 % IV SOLN
1.0000 g | Freq: Once | INTRAVENOUS | Status: AC
  Administered 2022-01-31: 1 g via INTRAVENOUS
  Filled 2022-01-31: qty 10

## 2022-01-31 MED ORDER — IOHEXOL 300 MG/ML  SOLN
75.0000 mL | Freq: Once | INTRAMUSCULAR | Status: AC | PRN
  Administered 2022-01-31: 100 mL via INTRAVENOUS

## 2022-01-31 MED ORDER — SODIUM CHLORIDE 0.9 % IV SOLN
500.0000 mg | Freq: Once | INTRAVENOUS | Status: AC
  Administered 2022-01-31: 500 mg via INTRAVENOUS
  Filled 2022-01-31: qty 5

## 2022-01-31 MED ORDER — CARBIDOPA-LEVODOPA 25-250 MG PO TABS
1.0000 | ORAL_TABLET | Freq: Three times a day (TID) | ORAL | Status: DC
  Administered 2022-01-31 – 2022-02-01 (×3): 1 via ORAL
  Filled 2022-01-31 (×4): qty 1

## 2022-01-31 MED ORDER — SODIUM CHLORIDE 0.9 % IV SOLN: 1.0000 g | INTRAVENOUS | Status: DC

## 2022-01-31 MED ORDER — SODIUM CHLORIDE 0.9 % IV SOLN: INTRAVENOUS | Status: AC

## 2022-01-31 MED ORDER — TERAZOSIN HCL 5 MG PO CAPS
10.0000 mg | ORAL_CAPSULE | Freq: Every day | ORAL | Status: DC
  Administered 2022-02-01: 10 mg via ORAL
  Filled 2022-01-31: qty 2

## 2022-01-31 MED ORDER — ACETAMINOPHEN 500 MG PO TABS
500.0000 mg | ORAL_TABLET | Freq: Four times a day (QID) | ORAL | Status: DC | PRN
  Filled 2022-01-31: qty 1

## 2022-01-31 MED ORDER — GABAPENTIN 300 MG PO CAPS
300.0000 mg | ORAL_CAPSULE | Freq: Three times a day (TID) | ORAL | Status: DC
  Administered 2022-01-31 – 2022-02-01 (×3): 300 mg via ORAL
  Filled 2022-01-31 (×3): qty 1

## 2022-01-31 NOTE — Assessment & Plan Note (Signed)
Hold home losartan-HCTZ given soft blood pressures.

## 2022-01-31 NOTE — ED Notes (Signed)
PALS line contacted [phone] per Eulis Foster MD request.  Potential transfer or consult for Hematology/oncology Dr. Bernestine Amass MD  Hematology/Oncologist on call will return a STAT consult from on call from either: Lesser MD or [facility] MD

## 2022-01-31 NOTE — Assessment & Plan Note (Signed)
Chronic indwelling Foley catheter -Continue [place] and and Foley care

## 2022-01-31 NOTE — ED Notes (Signed)
Lactic 3.0, Wentz MD notified via phone call.

## 2022-01-31 NOTE — ED Triage Notes (Signed)
Ems brings pt in from home for generalized weakness. States this weakness started in January but is worse over the past 2 days.

## 2022-01-31 NOTE — Assessment & Plan Note (Signed)
Chronic disease with associated memory deficits. -Continue Sinemet, Aricept, pramipexole

## 2022-01-31 NOTE — ED Provider Notes (Signed)
[facility] DEPT Provider Note   CSN: [id number] Arrival date & time: 01/31/22  1415     History  Chief Complaint  Patient presents with   Weakness    Jacob Bolton is a 81 y.o. male.  HPI He presents for evaluation of weakness, decreased appetite and diarrhea.  He is a somewhat vague historian.  He states he lives with his wife in their home.  He came here by EMS for evaluation.  He denies cough, fever, vomiting, blood in stool, abdominal pain or back pain.  He states he has trouble moving his legs because of weakness.  He has Parkinson's disease, follows closely with his neurologist.  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    Home Medications Prior to Admission medications   Medication Sig Start Date End Date Taking? Authorizing Provider  acetaminophen (TYLENOL) 500 MG tablet Take 500 mg by mouth every 6 (six) hours as needed for moderate pain.    [provider]  Ascorbic Acid (VITAMIN C) 500 MG CAPS Take 500 mg by mouth 3 (three) times daily.    [provider]  Azelastine HCl 0.15 % SOLN Place 1 spray into the nose in the morning and at bedtime. 11/11/16   [provider]  carbidopa-levodopa (SINEMET IR) 25-250 MG tablet TAKE 1 TABLET BY MOUTH THREE TIMES A DAY 01/07/22   Frann Rider, NP  Cholecalciferol 25 MCG (1000 UT) tablet Take 1,000 Units by mouth daily.    [provider]  Cranberry Extract 250 MG TABS Take 250 mg by mouth 3 (three) times daily.    [provider]  donepezil (ARICEPT) 5 MG tablet Take 1 tablet (5 mg total) by mouth daily after breakfast. 05/10/21   Kathrynn Ducking, MD  fluticasone [facility]) 50 MCG/ACT nasal spray Place 1 spray into both nostrils in the morning and at bedtime. 10/28/16   [provider]  gabapentin (NEURONTIN) 300 MG capsule Take 300 mg by mouth 3 (three) times daily.    [provider]  losartan-hydrochlorothiazide (HYZAAR)  100-12.5 MG tablet Take 1 tablet by mouth daily.    [provider]  oxyCODONE (OXY IR/ROXICODONE) 5 MG immediate release tablet Take 1 tablet (5 mg total) by mouth every 6 (six) hours as needed for severe pain or breakthrough pain. 11/13/21   Dwyane Dee, MD  pramipexole (MIRAPEX) 1 MG tablet TAKE 1 TABLET BY MOUTH THREE TIMES A DAY 01/07/22   Frann Rider, NP  terazosin (HYTRIN) 10 MG capsule Take 10 mg by mouth daily. 04/28/21   [provider]      Allergies    Donepezil and Sulfamethoxazole    Review of Systems   Review of Systems  Physical Exam Updated Vital Signs BP 123/74    Pulse 83    Temp 98 F (36.7 C) (Oral)    Resp 17    Ht _0  (1.803 m)    Wt 93 kg    SpO2 91%    BMI 28.60 kg/m  Physical Exam Vitals and nursing note reviewed.  Constitutional:      General: He is not in acute distress.    Appearance: He is well-developed. He is ill-appearing. He is not toxic-appearing or diaphoretic.  HENT:     Head: Normocephalic and atraumatic.     Right Ear: External ear normal.     Left Ear: External ear normal.     Mouth/Throat:     Mouth: Mucous  membranes are dry.  Eyes:     Conjunctiva/sclera: Conjunctivae normal.     Pupils: Pupils are equal, round, and reactive to light.  Neck:     Trachea: Phonation normal.  Cardiovascular:     Rate and Rhythm: Normal rate and regular rhythm.     Heart sounds: Normal heart sounds.  Pulmonary:     Effort: Pulmonary effort is normal.     Breath sounds: Normal breath sounds.  Abdominal:     Palpations: Abdomen is soft.     Tenderness: There is no abdominal tenderness.  Musculoskeletal:     Cervical back: Normal range of motion and neck supple.     Comments: Bilateral leg weakness, right greater than left.  Right lower and upper leg edema, with large mass right upper medial thigh, likely causing compression and decreased venous return leading to leg swelling.  Mass nontender to palpation.  No edema of the left  lower leg or arms.  Arm movement is typical of Parkinson's with mild cogwheeling rigidity.  Skin:    General: Skin is warm and dry.     Coloration: Skin is not jaundiced or pale.  Neurological:     Mental Status: He is alert and oriented to person, place, and time.     Cranial Nerves: No cranial nerve deficit.     Sensory: No sensory deficit.     Motor: No abnormal muscle tone.     Coordination: Coordination normal.     Comments: No dysarthria or aphasia.  Psychiatric:        Mood and Affect: Mood normal.        Behavior: Behavior normal.        Thought Content: Thought content normal.        Judgment: Judgment normal.    ED Results / Procedures / Treatments   Labs (all labs ordered are listed, but only abnormal results are displayed) Labs Reviewed  CBC WITH DIFFERENTIAL/PLATELET - Abnormal; Notable for the following components:      Result Value   WBC 17.1 (*)    RBC 3.64 (*)    Hemoglobin 10.6 (*)    HCT 33.6 (*)    Neutro Abs 11.9 (*)    Monocytes Absolute 1.4 (*)    Eosinophils Absolute 2.3 (*)    Abs Immature Granulocytes 0.08 (*)    All other components within normal limits  COMPREHENSIVE METABOLIC PANEL - Abnormal; Notable for the following components:   BUN 31 (*)    Total Protein 6.2 (*)    Albumin 3.0 (*)    All other components within normal limits  URINALYSIS, ROUTINE W REFLEX MICROSCOPIC - Abnormal; Notable for the following components:   Ketones, ur 15 (*)    Nitrite POSITIVE (*)    Leukocytes,Ua SMALL (*)    WBC, UA >50 (*)    All other components within normal limits  LACTIC ACID, PLASMA - Abnormal; Notable for the following components:   Lactic Acid, Venous 3.0 (*)    All other components within normal limits  LACTIC ACID, PLASMA - Abnormal; Notable for the following components:   Lactic Acid, Venous 2.9 (*)    All other components within normal limits  CULTURE, BLOOD (ROUTINE X 2)  CULTURE, BLOOD (ROUTINE X 2)  RESP PANEL BY RT-PCR (FLU A&B,  COVID) ARPGX2    EKG None  Radiology CT Chest W Contrast  Result Date: 01/31/2022 CLINICAL DATA:  Generalized weakness worsening for 2 days, abnormal chest x-ray, history of soft  tissue sarcoma EXAM: CT CHEST WITH CONTRAST TECHNIQUE: Multidetector CT imaging of the chest was performed during intravenous contrast administration. RADIATION DOSE REDUCTION: This exam was performed according to the departmental dose-optimization program which includes automated exposure control, adjustment of the mA and/or kV according to patient size and/or use of iterative reconstruction technique. CONTRAST:  193m OMNIPAQUE IOHEXOL 300 MG/ML  SOLN COMPARISON:  02/23/2019, 01/31/2022 FINDINGS: Cardiovascular: The heart is unremarkable without pericardial effusion. No evidence of thoracic aortic aneurysm or dissection. Mild diffuse atherosclerosis of the aorta and coronary vasculature. Mediastinum/Nodes: No enlarged mediastinal, hilar, or axillary lymph nodes. Thyroid gland, trachea, and esophagus demonstrate no significant findings. Lungs/Pleura: There is elevation of the left hemidiaphragm, with dense left lower lobe consolidation, volume loss, and mild varicoid bronchiectasis noted. Minimal ground-glass airspace disease is seen within the superior segment of the left lower lobe. Trace left pleural effusion. There are multiple bilateral pulmonary nodules, new since prior exam, with index nodules as follows: Left upper lobe, image 28/5, 9 x 7 x 9 mm. Left upper lobe, image 91/5, 11 x 8 x 9 mm. Right lower lobe, image 90/5, 14 x 13 x 15 mm. No pneumothorax.  Central airways are patent. Upper Abdomen: No acute abnormality. Musculoskeletal: No acute or destructive bony lesions. Reconstructed images demonstrate no additional findings. IMPRESSION: 1. Multiple bilateral pulmonary nodules as above, new since prior exam, nonspecific. In a patient with a known history of prior sarcoma, metastatic disease cannot be excluded. PET-CT may  be useful for further evaluation. 2. Patchy ground-glass airspace disease within the superior segment left lower lobe, which may be inflammatory, infectious, or related to aspiration. 3. Elevated left hemidiaphragm, with dense left lower lobe consolidation, varicoid bronchiectasis, and volume loss. 4. Trace left pleural effusion. 5.  Aortic Atherosclerosis (ICD10-I70.0). Electronically Signed   By: MRanda NgoM.D.   On: 01/31/2022 17:03   DG Chest Portable 1 View  Result Date: 01/31/2022 CLINICAL DATA:  Weakness EXAM: PORTABLE CHEST 1 VIEW COMPARISON:  Chest x-ray 05/01/2021, CT chest 02/23/2021 FINDINGS: The heart and mediastinal contours are within normal limits. Interval development of opacification of the entire left lower lung zone. No pulmonary edema. No right pleural effusion. At least trace to small volume left pleural effusion. No pneumothorax. No acute osseous abnormality. IMPRESSION: 1. Interval development of opacification of the entire left lower lung zone. Recommend CT chest with intravenous contrast for further evaluation. 2. At least trace to small volume left pleural effusion. Electronically Signed   By: MIven FinnM.D.   On: 01/31/2022 15:12    Procedures Procedures    Medications Ordered in ED Medications  0.9 %  sodium chloride infusion ( Intravenous New Bag/Given 01/31/22 1632)  iohexol (OMNIPAQUE) 300 MG/ML solution 75 mL (100 mLs Intravenous Contrast Given 01/31/22 1636)  cefTRIAXone (ROCEPHIN) 1 g in sodium chloride 0.9 % 100 mL IVPB (0 g Intravenous Stopped 01/31/22 1833)  azithromycin (ZITHROMAX) 500 mg in sodium chloride 0.9 % 250 mL IVPB (500 mg Intravenous New Bag/Given 01/31/22 1849)  sodium chloride 0.9 % bolus 2,700 mL (0 mLs Intravenous Stopped 01/31/22 1936)    ED Course/ Medical Decision Making/ A&P Clinical Course as of 01/31/22 2056  Thu Jan 31, 2022  1734 Initial lactate elevated.  Coupled with elevated white count, this is concerning for infectious process,  most likely respiratory.  We will begin empiric antibiotics for community-acquired pneumonia. [EW]  1826 Patient's wife CArbie Cookeyis here now and was able to be some additional history.  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  She is not sure what exact avenue and timing for ongoing care at [facility], is.  She agrees that the patient needs some additional help because of his "general state."  She is concerned about his inability to move the right leg, and "frozen Parkinson's." [EW]    Clinical Course User Index [EW] Daleen Bo, MD                           Medical Decision Making Patient presenting for vague symptoms.  He denies cough or fever.  He has ongoing diarrhea, unclear cause.  Lives at home with his wife.  He has Parkinson's disease.  He does not have any respiratory symptoms.  Amount and/or Complexity of Data Reviewed External Data Reviewed: labs, radiology and notes.    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  Full plans have not been yet made.  He was referred to palliative care.  Recent comprehensive CT imaging indicates pulmonary nodules, likely due to metastatic disease.  Plans are in the works to biopsy a pulmonary nodule. Labs: ordered.    Details: CBC, metabolic panel, viral panel, urinalysis-normal except white count high, BUN high,Total protein albumin low. Radiology: ordered and independent interpretation performed.    Details: Chest x-ray indicates opacification left lower lobe, from mid zone down.  Unclear if this is fluid or pneumonia or associated mass.  CT is consistent with left lung infectious process and small pleural effusion.  This appears to be a consolidated pneumonia, mostly in the lower lobe ECG/medicine tests:  ordered.    Details: Cardiac monitor with normal sinus rhythm. Discussion of management or test interpretation with external provider(s): Consultation hospitalist to arrange for admission.  Hospitalist asked me to contact oncology service at [facility].  I talked to Dr. Maylon Peppers, who accepts the patient pending an open bed.  When a bed opens up on the oncology floor, patient can be transferred.  I then talked again to the hospitalist who agrees to see the patient as a Optometrist to make sure his medical needs are met appropriately, while he is waiting for transfer.  Patient has acute needs that require stabilization to prevent decompensation.  Patient also appears to need palliative care, progressing to end-of-life care.  There are no recorded end-of-life documents.  Risk Prescription drug management. Decision regarding hospitalization. Risk Details: Patient appears to have progressive and worsening metastatic sarcoma, compromising right leg due to mass-effect making right leg weaker than left.  Overall illness complicated by Parkinson's disease.  Abnormal chest x-ray with CT evidence for inflammatory process which is nonspecific.  White count is elevated.  He does not have symptoms of pneumonia.  Patient lives with his wife, who is here in the ED with him.  She has limited understanding and plans for his care.  She seems somewhat overwhelmed, and states that she wants him to have some care in [place], for his current condition.  He does not currently have a oncologist in [place].  Critical Care Total time providing critical care: 30-74 minutes          Final Clinical Impression(s) / ED Diagnoses Final diagnoses:  Community acquired pneumonia of left lung, unspecified part of lung  Metastatic sarcoma ([place])  Diarrhea, unspecified type  Right leg swelling    Rx / DC Orders ED Discharge Orders     None         Daleen Bo, MD 02/02/22 2302

## 2022-01-31 NOTE — Assessment & Plan Note (Signed)
Follows with [facility] orthopedic surgery, radiation/oncology, hematology/oncology for management of right upper thigh/knee liposarcoma.  Recent biopsy shows high-grade dedifferentiated liposarcoma.  His treatment team are considering chemotherapy, radiation, and potential need for hip disarticulation. -EDP discussed with [facility] oncology who have accepted patient for transfer pending oncology bed availability

## 2022-01-31 NOTE — ED Provider Notes (Signed)
°  Provider Note MRN:  [id number]  Arrival date & time: 01/31/22    ED Course and Medical Decision Making  Assumed care from Dr. Eulis Foster at shift change.  Leg sarcoma awaiting transfer to [facility].  Procedures  Final Clinical Impressions(s) / ED Diagnoses     ICD-10-CM   1. Community acquired pneumonia of left lung, unspecified part of lung  J18.9     2. Metastatic sarcoma ([place])  C79.89     3. Diarrhea, unspecified type  R19.7     4. Right leg swelling  M79.89       ED Discharge Orders     None       Discharge Instructions   None     Barth Kirks. [place]onia Small, [place] [email or web address]    Maudie Flakes, MD 02/01/22 [phone]

## 2022-01-31 NOTE — Assessment & Plan Note (Addendum)
CT chest with left lower lobe consolidative appearance concerning for pneumonia.  He is not exhibiting significant respiratory symptoms or fever however given leukocytosis we will continue with empiric antibiotics.  Would consider aspiration pneumonia as well. -Continue empiric IV ceftriaxone and azithromycin -Follow blood cultures collected 01/31/2022 -COVID and influenza negative -Supplemental oxygen if needed

## 2022-01-31 NOTE — Consult Note (Signed)
Initial Consultation Note   Patient: Jacob Bolton CBJ:[id number] DOB: Nov 14, 1941 PCP: Kristen Loader, FNP DOA: 01/31/2022 DOS: the patient was seen and examined on 01/31/2022 Primary service: Daleen Bo, MD  Referring physician: Daleen Bo, MD (Emergency Medicine) Reason for consult: Medical management  Assessment/Plan: Assessment and Plan: * Left lower lobe pneumonia- (present on admission) CT chest with left lower lobe consolidative appearance concerning for pneumonia.  He is not exhibiting significant respiratory symptoms or fever however given leukocytosis we will continue with empiric antibiotics.  Would consider aspiration pneumonia as well. 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  Recent biopsy shows high-grade dedifferentiated liposarcoma.  His treatment team are considering chemotherapy, radiation, and potential need for hip disarticulation. -EDP discussed with [facility] oncology who have accepted patient for transfer pending oncology bed availability  HTN (hypertension)- (present on admission) Hold home losartan-HCTZ given soft blood pressures.  Parkinson disease ([place])- (present on admission) Chronic disease with associated memory deficits. -Continue Sinemet, Aricept, pramipexole  Benign prostatic hyperplasia with urinary retention- (present on admission) Chronic indwelling Foley catheter -Continue [place] and and Foley care   [facility] will continue to follow the patient.  HPI: Jacob Bolton is a 81 y.o. male with past medical history of soft tissue liposarcomas of the right thigh and knee (previously well differentiated however recent biopsy  01/08/2022 shows high-grade, dedifferentiated liposarcoma), chronic RLE edema and weakness due to tumor mass-effect, Parkinson's disease with memory deficits, hypertension, BPH with chronic urinary retention and indwelling Foley catheter who presented to the ED for evaluation of worsening weakness, diarrhea, decreased range of motion right leg.  History is supplemented by patient's spouse at bedside.  [facility] for his chronic known RLE liposarcoma.  Recent biopsy revealed high-grade dedifferentiated liposarcoma.  Follow-up imaging with CT chest/abdomen/pelvis 01/15/2022 shows extensive pulmonary nodules highly suspicious for metastatic disease, nonspecific low-density lesions scattered throughout the liver, and large mass in the right medial thigh and inguinal area consistent with known sarcoma.  Associated bilateral inguinal adenopathy and right-sided deep pelvic adenopathy seen.  MRI right thigh/femur 01/01/2022 showed marrow replacing lesion within the right parasymphyseal pubic body concerning for osseous metastasis.  He is following with orthopedic surgery, hematology/oncology, and radiation/oncology through [facility]  They are discussing potential management with chemotherapy, radiation, and potential need for hip disarticulation.  Patient has chronic difficulty ambulating due to the right upper thigh sarcoma and subsequent mass-effect.  Today however he was unable to stand and his wife felt she was unable to care for him and therefore brought him to the ED.  He has had occasional nonproductive cough.  He denies any dysphagia, dyspnea, chest pain, nausea, vomiting, abdominal pain.  His wife notes approximately 30 pound weight loss over the last year.  In the ED, patient was given 2.7 L normal saline and IV ceftriaxone and azithromycin.  EDP discussed with [facility]ology who accepted patient for transfer.  The hospitalist service was consulted to assist  with medical management pending transfer.  Review of Systems: As mentioned in the history of present illness. All other systems reviewed and are negative. 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   Parkinson disease ([place]) 12/03/2016   Past  Surgical History:  Procedure Laterality Date   CERVICAL DISC SURGERY  2007   Ruptured disc, plates and screws   COLONOSCOPY  2008   DEEP THIGH / KNEE TUMOR EXCISION Right 2007, 2009, 2014   Dr. Leonides Schanz, [facility]   Social History:  reports that he has quit smoking. He has never used smokeless tobacco. He reports current alcohol use of about 3.0 standard drinks per week. He reports that he does not use drugs.  Allergies  Allergen Reactions   Donepezil Other (See Comments)    Nightmares (only if taken at night, its ok in the morning)   Sulfamethoxazole     Other reaction(s): Other (See Comments) Childhood allergy    Family History  Problem Relation Age of Onset   High blood pressure Mother    Anxiety disorder Mother    High blood pressure Father    Heart attack Father     Prior to Admission medications   Medication Sig Start Date End Date Taking? Authorizing Provider  acetaminophen (TYLENOL) 500 MG tablet Take 500 mg by mouth every 6 (six) hours as needed for moderate pain.    [provider]  Ascorbic Acid (VITAMIN C) 500 MG CAPS Take 500 mg by mouth 3 (three) times daily.    [provider]  Azelastine HCl 0.15 % SOLN Place 1 spray into the nose in the morning and at bedtime. 11/11/16   [provider]  carbidopa-levodopa (SINEMET IR) 25-250 MG tablet TAKE 1 TABLET BY MOUTH THREE TIMES A DAY 01/07/22   Frann Rider, NP  Cholecalciferol 25 MCG (1000 UT) tablet Take 1,000 Units by mouth daily.    [provider]  Cranberry Extract 250 MG TABS Take 250 mg by mouth 3 (three) times daily.    [provider]  donepezil (ARICEPT) 5 MG tablet Take 1  tablet (5 mg total) by mouth daily after breakfast. 05/10/21   Kathrynn Ducking, MD  fluticasone [facility]) 50 MCG/ACT nasal spray Place 1 spray into both nostrils in the morning and at bedtime. 10/28/16   [provider]  gabapentin (NEURONTIN) 300 MG capsule Take 300 mg by mouth 3 (three) times daily.    [provider]  losartan-hydrochlorothiazide (HYZAAR) 100-12.5 MG tablet Take 1 tablet by mouth daily.    [provider]  oxyCODONE (OXY IR/ROXICODONE) 5 MG immediate release tablet Take 1 tablet (5 mg total) by mouth every 6 (six) hours as needed for severe pain or breakthrough pain. 11/13/21   Dwyane Dee, MD  pramipexole (MIRAPEX) 1 MG tablet TAKE 1 TABLET BY MOUTH THREE TIMES A DAY 01/07/22   Frann Rider, NP  terazosin (HYTRIN) 10 MG capsule Take 10 mg by mouth daily. 04/28/21   [provider]    Physical Exam: Vitals:   01/31/22 1700 01/31/22 1830 01/31/22 2030 01/31/22 2230  BP: 107/66 119/72 123/74 93/81  Pulse: 78 83 83 94  Resp: (!) 21 15 17 20   Temp:      TempSrc:      SpO2: 96% 95% 91% 90%  Weight:      Height:       General: Elderly man resting in bed, appears fatigued HEENT: PERRL, EOMI, no scleral icterus Cardiac: RRR, systolic murmur present Pulm: Inspiratory crackles left lung base otherwise clear to auscultation Abd: soft, nontender, nondistended GU: Foley catheter in place Ext: Large tumor anterior right upper thigh with 2-3+ RLE edema.  ROM RLE diminished due to compressive neuropathy and edema.  ROM  bilateral upper and left lower extremities intact. Neuro: cranial nerves II-XII grossly intact, strength intact bilateral upper and left lower extremity.  Unable to elevate right leg off bed. Psych: Awake, alert, oriented to self, place, year but not situation.  Exhibits short-term memory deficits.  Data Reviewed:  Initial vitals showed BP 117/78, pulse 90, RR 18, temp 98.0 F, SPO2 95% on room air.  Labs show WBC 17.1,  hemoglobin 10.6, platelets 172,000, Sodium 139, potassium 3.8, bicarb 23, BUN 31, creatinine 0.81, serum glucose 89, lactic acid 3.0 > 2.9.  Urinalysis shows positive nitrates, small leukocytes, 6-10 RBC/hpf, >50 WBC/hpf, no bacteria microscopy.  Blood cultures collected and in process.  Respiratory panel in process.  Portable chest x-ray showed interval development of opacification of the entire left lower lung zone.  CT chest with contrast shows multiple bilateral pulmonary nodules, patchy groundglass airspace disease within the superior segment left lower lobe, elevated left hemidiaphragm with dense left lower lobe consolidation, varicoid bronchiectasis, and volume loss.  Trace left pleural effusion.  VTE prophylaxis: enoxaparin (LOVENOX) injection 40 mg Start: 02/01/22 1000 Family Communication: Discussed with patient's wife at bedside Primary team communication:  Thank you very much for involving [place] in the care of your patient.  Author: Zada Finders, MD 01/31/2022 10:49 PM  For on call review [email or web address].

## 2022-02-01 DIAGNOSIS — C7651 Malignant neoplasm of right lower limb: Secondary | ICD-10-CM | POA: Diagnosis not present

## 2022-02-01 DIAGNOSIS — N390 Urinary tract infection, site not specified: Secondary | ICD-10-CM | POA: Diagnosis not present

## 2022-02-01 DIAGNOSIS — J90 Pleural effusion, not elsewhere classified: Secondary | ICD-10-CM | POA: Diagnosis not present

## 2022-02-01 DIAGNOSIS — G8929 Other chronic pain: Secondary | ICD-10-CM | POA: Diagnosis not present

## 2022-02-01 DIAGNOSIS — E872 Acidosis, unspecified: Secondary | ICD-10-CM | POA: Diagnosis not present

## 2022-02-01 DIAGNOSIS — F0280 Dementia in other diseases classified elsewhere without behavioral disturbance: Secondary | ICD-10-CM | POA: Diagnosis not present

## 2022-02-01 DIAGNOSIS — R918 Other nonspecific abnormal finding of lung field: Secondary | ICD-10-CM | POA: Diagnosis not present

## 2022-02-01 DIAGNOSIS — G20 Parkinson's disease: Secondary | ICD-10-CM | POA: Diagnosis not present

## 2022-02-01 DIAGNOSIS — N400 Enlarged prostate without lower urinary tract symptoms: Secondary | ICD-10-CM | POA: Diagnosis not present

## 2022-02-01 LAB — CBC
HCT: 33.1 % — ABNORMAL LOW (ref 39.0–52.0)
Hemoglobin: 10.5 g/dL — ABNORMAL LOW (ref 13.0–17.0)
MCH: 29.2 pg (ref 26.0–34.0)
MCHC: 31.7 g/dL (ref 30.0–36.0)
MCV: 92.2 fL (ref 80.0–100.0)
Platelets: 160 10*3/uL (ref 150–400)
RBC: 3.59 MIL/uL — ABNORMAL LOW (ref 4.22–5.81)
RDW: 14.6 % (ref 11.5–15.5)
WBC: 18.8 10*3/uL — ABNORMAL HIGH (ref 4.0–10.5)
nRBC: 0 % (ref 0.0–0.2)

## 2022-02-01 MED ORDER — DONEPEZIL HCL 5 MG PO TABS
5.0000 mg | ORAL_TABLET | Freq: Every day | ORAL | Status: DC
  Administered 2022-02-01: 5 mg via ORAL
  Filled 2022-02-01: qty 1

## 2022-02-01 NOTE — ED Notes (Signed)
Telephone report given to [facility] with [employer]

## 2022-02-01 NOTE — ED Notes (Signed)
Pt refusing to let blood be drawn at this time d/t paranoia.

## 2022-02-01 NOTE — ED Notes (Signed)
Patient had a bowel movement brief changed and repositioned in bed

## 2022-02-01 NOTE — ED Provider Notes (Signed)
Signout from Dr. Sedonia Small.  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  Here with generalized weakness and having signs of nodules in his chest.  Being covered for pneumonia with antibiotics.  He is pending transfer to [facility] where his treatment team is.  Medicine has placed a consult.  Plan is to transfer to [facility] when bed available versus admitting here if no bed available in a reasonable time. Physical Exam  BP (!) 109/58    Pulse 90    Temp 99.1 F (37.3 C) (Oral)    Resp 20    Ht 5\' 11"  (1.803 m)    Wt 93 kg    SpO2 95%    BMI 28.60 kg/m   Physical Exam  Procedures  Procedures  ED Course / MDM   Clinical Course as of 02/01/22 0754  Thu Jan 31, 2022  1734 Initial lactate elevated.  Coupled with elevated white count, this is concerning for infectious process, most likely respiratory.  We will begin empiric antibiotics for community-acquired pneumonia. [EW]  1826 Patient's wife Arbie Cookey is here now and was able to be some additional history.  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  She is not sure what exact avenue and timing for ongoing care at [place], is.  She agrees that the patient needs some additional help because of his "general state."  She is concerned about his inability to move the right leg, and "frozen Parkinson's." [EW]    Clinical Course User Index [EW] Daleen Bo, MD   Medical Decision Making Amount and/or Complexity of Data Reviewed Labs: ordered. Radiology: ordered.  Risk Prescription drug management.  Discussed with spouse to asked that we hold off on admitting the patient until 3 PM.  If no transfer bed opens up plan hospitalist team will accept the patient for admission  3 PM.  Bed available at [facility] transport team.         Hayden Rasmussen, MD 02/01/22 1755

## 2022-02-01 NOTE — ED Notes (Signed)
Pt is paranoid at this time.  Pt is speaking of, "Them" stating they have both his wife and this Probation officer.  This writer sat next to pt holding his hand offering emotional support.  Pt then asked this writer to leave and find his wife.

## 2022-02-01 NOTE — ED Notes (Signed)
Breakfast tray provided. 

## 2022-02-02 DIAGNOSIS — R600 Localized edema: Secondary | ICD-10-CM | POA: Diagnosis not present

## 2022-02-02 DIAGNOSIS — E872 Acidosis, unspecified: Secondary | ICD-10-CM | POA: Diagnosis not present

## 2022-02-02 DIAGNOSIS — G8929 Other chronic pain: Secondary | ICD-10-CM | POA: Diagnosis not present

## 2022-02-02 DIAGNOSIS — C4921 Malignant neoplasm of connective and soft tissue of right lower limb, including hip: Secondary | ICD-10-CM | POA: Diagnosis not present

## 2022-02-02 DIAGNOSIS — F0280 Dementia in other diseases classified elsewhere without behavioral disturbance: Secondary | ICD-10-CM | POA: Diagnosis not present

## 2022-02-02 DIAGNOSIS — N400 Enlarged prostate without lower urinary tract symptoms: Secondary | ICD-10-CM | POA: Diagnosis not present

## 2022-02-02 DIAGNOSIS — G20 Parkinson's disease: Secondary | ICD-10-CM | POA: Diagnosis not present

## 2022-02-02 DIAGNOSIS — Z960 Presence of urogenital implants: Secondary | ICD-10-CM | POA: Diagnosis not present

## 2022-02-02 DIAGNOSIS — R918 Other nonspecific abnormal finding of lung field: Secondary | ICD-10-CM | POA: Diagnosis not present

## 2022-02-02 DIAGNOSIS — N390 Urinary tract infection, site not specified: Secondary | ICD-10-CM | POA: Diagnosis not present

## 2022-02-03 DIAGNOSIS — N390 Urinary tract infection, site not specified: Secondary | ICD-10-CM | POA: Diagnosis not present

## 2022-02-03 DIAGNOSIS — E872 Acidosis, unspecified: Secondary | ICD-10-CM | POA: Diagnosis not present

## 2022-02-03 DIAGNOSIS — R918 Other nonspecific abnormal finding of lung field: Secondary | ICD-10-CM | POA: Diagnosis not present

## 2022-02-03 DIAGNOSIS — G8929 Other chronic pain: Secondary | ICD-10-CM | POA: Diagnosis not present

## 2022-02-03 DIAGNOSIS — F0280 Dementia in other diseases classified elsewhere without behavioral disturbance: Secondary | ICD-10-CM | POA: Diagnosis not present

## 2022-02-03 DIAGNOSIS — C4921 Malignant neoplasm of connective and soft tissue of right lower limb, including hip: Secondary | ICD-10-CM | POA: Diagnosis not present

## 2022-02-03 DIAGNOSIS — R600 Localized edema: Secondary | ICD-10-CM | POA: Diagnosis not present

## 2022-02-03 DIAGNOSIS — G20 Parkinson's disease: Secondary | ICD-10-CM | POA: Diagnosis not present

## 2022-02-03 DIAGNOSIS — N400 Enlarged prostate without lower urinary tract symptoms: Secondary | ICD-10-CM | POA: Diagnosis not present

## 2022-02-03 DIAGNOSIS — Z960 Presence of urogenital implants: Secondary | ICD-10-CM | POA: Diagnosis not present

## 2022-02-04 DIAGNOSIS — C7651 Malignant neoplasm of right lower limb: Secondary | ICD-10-CM | POA: Diagnosis not present

## 2022-02-04 DIAGNOSIS — Z960 Presence of urogenital implants: Secondary | ICD-10-CM | POA: Diagnosis not present

## 2022-02-04 DIAGNOSIS — N390 Urinary tract infection, site not specified: Secondary | ICD-10-CM | POA: Diagnosis not present

## 2022-02-04 DIAGNOSIS — J189 Pneumonia, unspecified organism: Secondary | ICD-10-CM | POA: Diagnosis not present

## 2022-02-04 DIAGNOSIS — E8729 Other acidosis: Secondary | ICD-10-CM | POA: Diagnosis not present

## 2022-02-05 DIAGNOSIS — N390 Urinary tract infection, site not specified: Secondary | ICD-10-CM | POA: Diagnosis not present

## 2022-02-05 DIAGNOSIS — Z960 Presence of urogenital implants: Secondary | ICD-10-CM | POA: Diagnosis not present

## 2022-02-05 DIAGNOSIS — C7651 Malignant neoplasm of right lower limb: Secondary | ICD-10-CM | POA: Diagnosis not present

## 2022-02-05 DIAGNOSIS — E8729 Other acidosis: Secondary | ICD-10-CM | POA: Diagnosis not present

## 2022-02-05 DIAGNOSIS — J189 Pneumonia, unspecified organism: Secondary | ICD-10-CM | POA: Diagnosis not present

## 2022-02-05 LAB — CULTURE, BLOOD (ROUTINE X 2)
Culture: NO GROWTH
Culture: NO GROWTH

## 2022-02-06 DIAGNOSIS — Z960 Presence of urogenital implants: Secondary | ICD-10-CM | POA: Diagnosis not present

## 2022-02-06 DIAGNOSIS — C7989 Secondary malignant neoplasm of other specified sites: Secondary | ICD-10-CM | POA: Diagnosis not present

## 2022-02-06 DIAGNOSIS — C7651 Malignant neoplasm of right lower limb: Secondary | ICD-10-CM | POA: Diagnosis not present

## 2022-02-06 DIAGNOSIS — R339 Retention of urine, unspecified: Secondary | ICD-10-CM | POA: Diagnosis not present

## 2022-02-06 DIAGNOSIS — N390 Urinary tract infection, site not specified: Secondary | ICD-10-CM | POA: Diagnosis not present

## 2022-02-06 DIAGNOSIS — R1312 Dysphagia, oropharyngeal phase: Secondary | ICD-10-CM | POA: Diagnosis not present

## 2022-02-06 DIAGNOSIS — M6281 Muscle weakness (generalized): Secondary | ICD-10-CM | POA: Diagnosis not present

## 2022-02-06 DIAGNOSIS — F0390 Unspecified dementia without behavioral disturbance: Secondary | ICD-10-CM | POA: Diagnosis not present

## 2022-02-06 DIAGNOSIS — E8729 Other acidosis: Secondary | ICD-10-CM | POA: Diagnosis not present

## 2022-02-06 DIAGNOSIS — C4921 Malignant neoplasm of connective and soft tissue of right lower limb, including hip: Secondary | ICD-10-CM | POA: Diagnosis not present

## 2022-02-06 DIAGNOSIS — R4182 Altered mental status, unspecified: Secondary | ICD-10-CM | POA: Diagnosis not present

## 2022-02-06 DIAGNOSIS — R410 Disorientation, unspecified: Secondary | ICD-10-CM | POA: Diagnosis not present

## 2022-02-06 DIAGNOSIS — I10 Essential (primary) hypertension: Secondary | ICD-10-CM | POA: Diagnosis not present

## 2022-02-06 DIAGNOSIS — F918 Other conduct disorders: Secondary | ICD-10-CM | POA: Diagnosis not present

## 2022-02-06 DIAGNOSIS — J189 Pneumonia, unspecified organism: Secondary | ICD-10-CM | POA: Diagnosis not present

## 2022-02-06 DIAGNOSIS — R197 Diarrhea, unspecified: Secondary | ICD-10-CM | POA: Diagnosis not present

## 2022-02-06 DIAGNOSIS — C4920 Malignant neoplasm of connective and soft tissue of unspecified lower limb, including hip: Secondary | ICD-10-CM | POA: Diagnosis not present

## 2022-02-06 DIAGNOSIS — N320 Bladder-neck obstruction: Secondary | ICD-10-CM | POA: Diagnosis not present

## 2022-02-06 DIAGNOSIS — Z743 Need for continuous supervision: Secondary | ICD-10-CM | POA: Diagnosis not present

## 2022-02-06 DIAGNOSIS — G20 Parkinson's disease: Secondary | ICD-10-CM | POA: Diagnosis not present

## 2022-02-06 DIAGNOSIS — R627 Adult failure to thrive: Secondary | ICD-10-CM | POA: Diagnosis not present

## 2022-02-07 DIAGNOSIS — N320 Bladder-neck obstruction: Secondary | ICD-10-CM | POA: Diagnosis not present

## 2022-02-07 DIAGNOSIS — J189 Pneumonia, unspecified organism: Secondary | ICD-10-CM | POA: Diagnosis not present

## 2022-02-07 DIAGNOSIS — R197 Diarrhea, unspecified: Secondary | ICD-10-CM | POA: Diagnosis not present

## 2022-02-07 DIAGNOSIS — R339 Retention of urine, unspecified: Secondary | ICD-10-CM | POA: Diagnosis not present

## 2022-02-07 DIAGNOSIS — M6281 Muscle weakness (generalized): Secondary | ICD-10-CM | POA: Diagnosis not present

## 2022-02-07 DIAGNOSIS — I10 Essential (primary) hypertension: Secondary | ICD-10-CM | POA: Diagnosis not present

## 2022-02-07 DIAGNOSIS — G20 Parkinson's disease: Secondary | ICD-10-CM | POA: Diagnosis not present

## 2022-02-07 DIAGNOSIS — C7989 Secondary malignant neoplasm of other specified sites: Secondary | ICD-10-CM | POA: Diagnosis not present

## 2022-02-07 DIAGNOSIS — N390 Urinary tract infection, site not specified: Secondary | ICD-10-CM | POA: Diagnosis not present

## 2022-02-08 DIAGNOSIS — G20 Parkinson's disease: Secondary | ICD-10-CM | POA: Diagnosis not present

## 2022-02-08 DIAGNOSIS — C4920 Malignant neoplasm of connective and soft tissue of unspecified lower limb, including hip: Secondary | ICD-10-CM | POA: Diagnosis not present

## 2022-02-08 DIAGNOSIS — J189 Pneumonia, unspecified organism: Secondary | ICD-10-CM | POA: Diagnosis not present

## 2022-02-08 DIAGNOSIS — F0390 Unspecified dementia without behavioral disturbance: Secondary | ICD-10-CM | POA: Diagnosis not present

## 2022-02-11 DIAGNOSIS — I10 Essential (primary) hypertension: Secondary | ICD-10-CM | POA: Diagnosis not present

## 2022-02-11 DIAGNOSIS — C7989 Secondary malignant neoplasm of other specified sites: Secondary | ICD-10-CM | POA: Diagnosis not present

## 2022-02-11 DIAGNOSIS — G20 Parkinson's disease: Secondary | ICD-10-CM | POA: Diagnosis not present

## 2022-02-11 DIAGNOSIS — R339 Retention of urine, unspecified: Secondary | ICD-10-CM | POA: Diagnosis not present

## 2022-02-11 DIAGNOSIS — R627 Adult failure to thrive: Secondary | ICD-10-CM | POA: Diagnosis not present

## 2022-02-11 DIAGNOSIS — N320 Bladder-neck obstruction: Secondary | ICD-10-CM | POA: Diagnosis not present

## 2022-02-11 DIAGNOSIS — R4182 Altered mental status, unspecified: Secondary | ICD-10-CM | POA: Diagnosis not present

## 2022-02-12 DIAGNOSIS — N390 Urinary tract infection, site not specified: Secondary | ICD-10-CM | POA: Diagnosis not present

## 2022-02-12 DIAGNOSIS — G20 Parkinson's disease: Secondary | ICD-10-CM | POA: Diagnosis not present

## 2022-02-12 DIAGNOSIS — I10 Essential (primary) hypertension: Secondary | ICD-10-CM | POA: Diagnosis not present

## 2022-02-12 DIAGNOSIS — R627 Adult failure to thrive: Secondary | ICD-10-CM | POA: Diagnosis not present

## 2022-02-12 DIAGNOSIS — R339 Retention of urine, unspecified: Secondary | ICD-10-CM | POA: Diagnosis not present

## 2022-02-12 DIAGNOSIS — R4182 Altered mental status, unspecified: Secondary | ICD-10-CM | POA: Diagnosis not present

## 2022-02-12 DIAGNOSIS — C7989 Secondary malignant neoplasm of other specified sites: Secondary | ICD-10-CM | POA: Diagnosis not present

## 2022-02-18 ENCOUNTER — Telehealth: Payer: Self-pay | Admitting: Neurology

## 2022-02-18 NOTE — Telephone Encounter (Signed)
Pt's wife called to let us know that pt passed away this past Thursday, 03/02/2022. Cancelled future appointments.

## 2022-02-27 DEATH — deceased

## 2022-03-12 ENCOUNTER — Ambulatory Visit: Payer: PPO | Admitting: Neurology

## 2022-04-25 ENCOUNTER — Ambulatory Visit: Payer: PPO | Admitting: Neurology

## 2022-07-23 IMAGING — MR MR LUMBAR SPINE W/O CM
4 of 5 series · 25 of 48 positions shown · non-contrast
Comparison: Lumbar radiographs 08/30/2020

CLINICAL DATA: Right-sided low back pain for greater than 6 weeks.

EXAM:
MRI LUMBAR SPINE WITHOUT CONTRAST
TECHNIQUE: Multiplanar, multisequence MR imaging of the lumbar spine was
performed. No intravenous contrast was administered.

[Series 3: T2 post-contrast · sagittal · 4.0mm · 0.53mm/px · 6 of 16 slices shown]
[im 1/16]
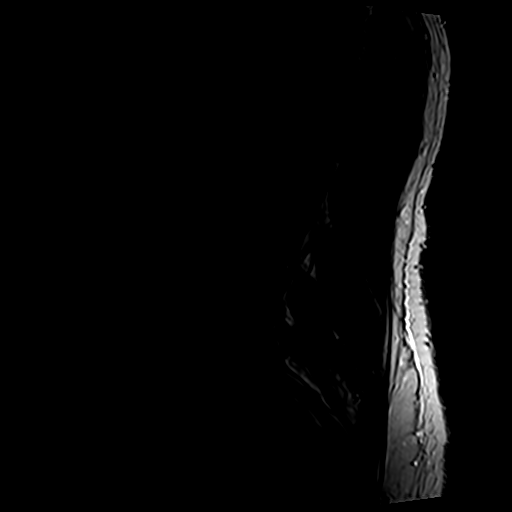
[im 4/16]
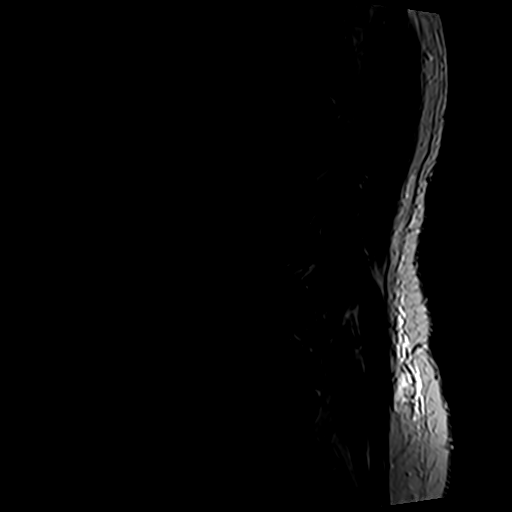
[im 7/16]
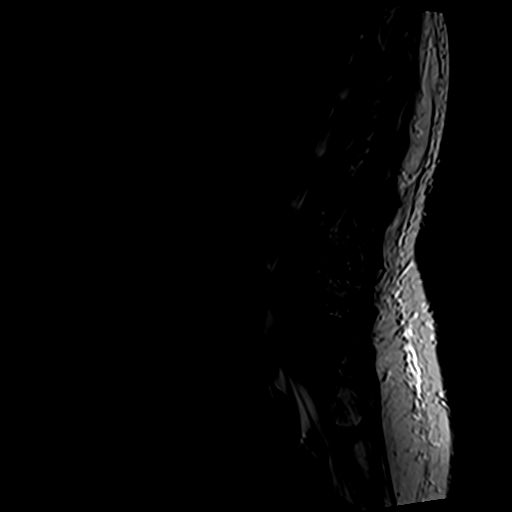
[im 10/16]
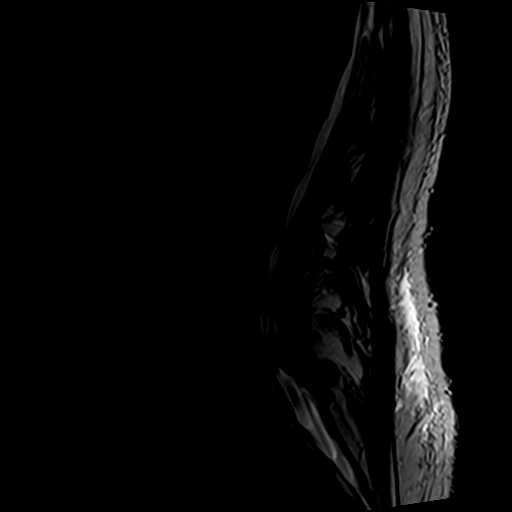
[im 13/16]
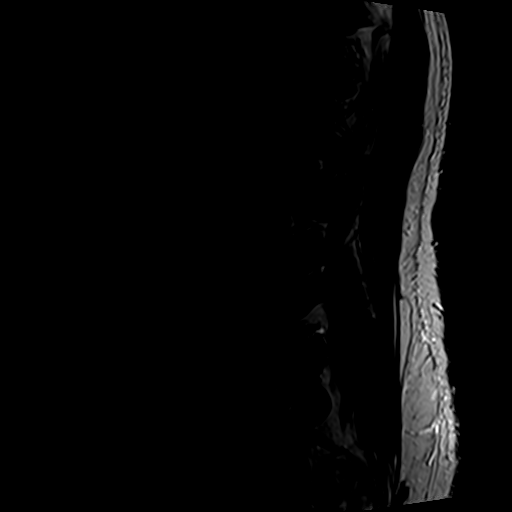
[im 16/16]
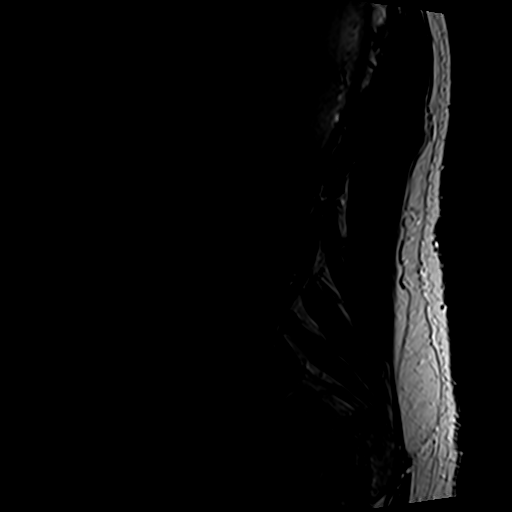

[Series 5: T1 · sagittal · 4.0mm · 0.53mm/px · 6 of 16 slices shown (1 of 2)]
[im 1/16]
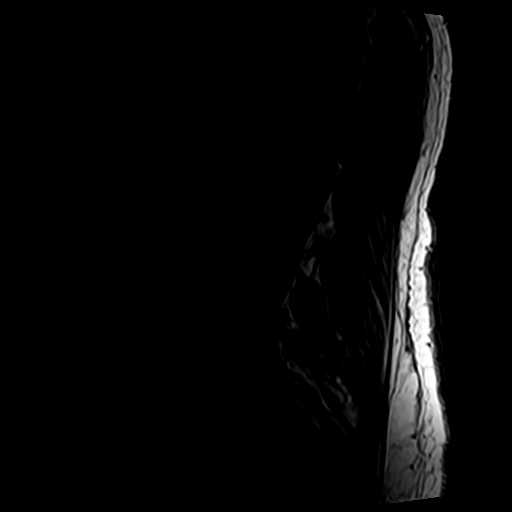
[im 4/16]
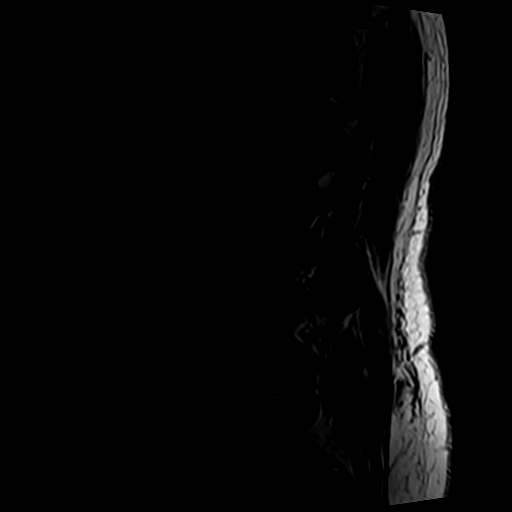
[im 7/16]
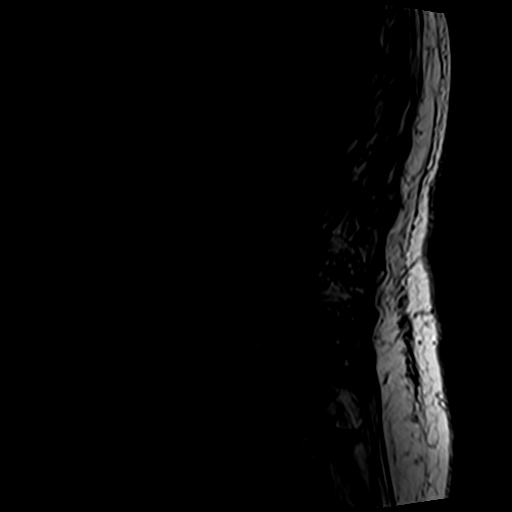
[im 10/16]
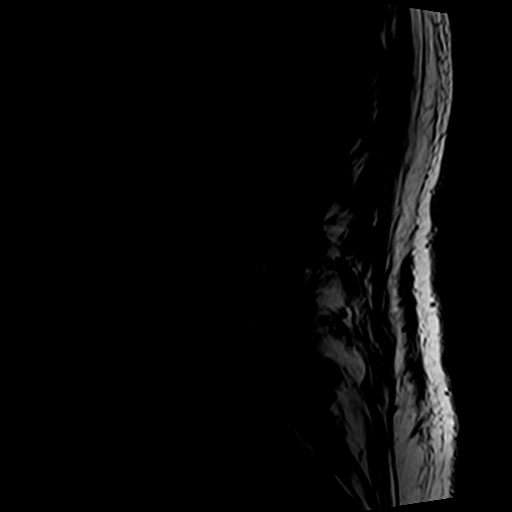
[im 13/16]
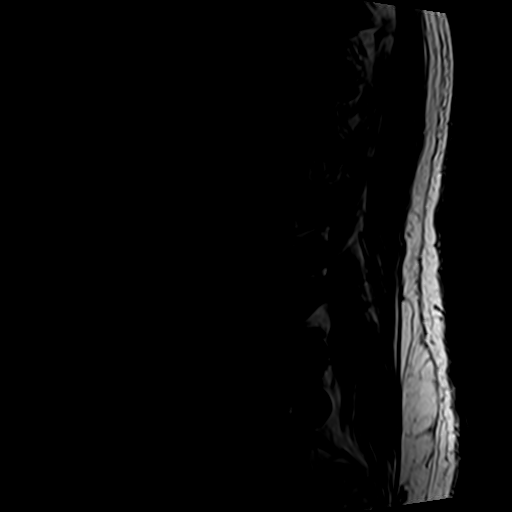
[im 16/16]
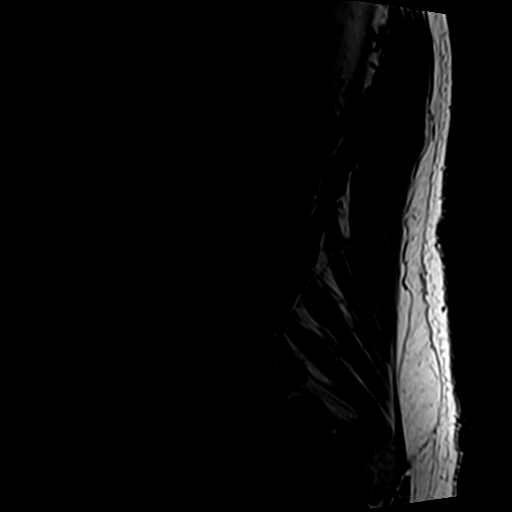

[Series 6: T2 · axial · 4.0mm · 0.70mm/px · z∈[-73,+140]mm · 9 of 38 slices shown]
[im 1/38]
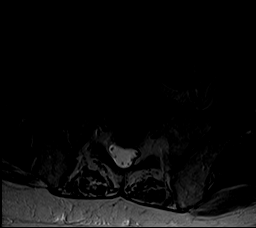
[im 6/38]
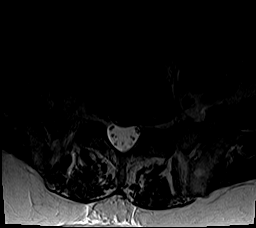
[im 11/38]
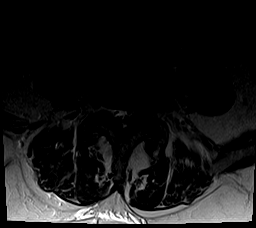
[im 16/38]
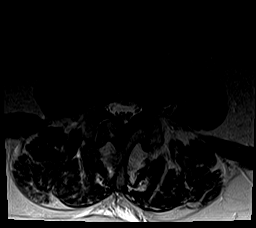
[im 19/38]
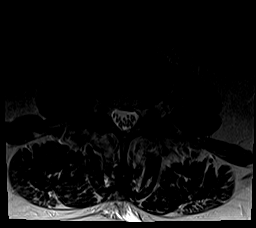
[im 22/38]
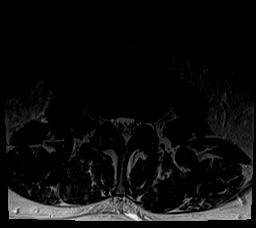
[im 27/38]
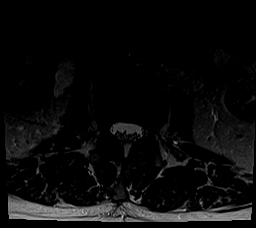
[im 32/38]
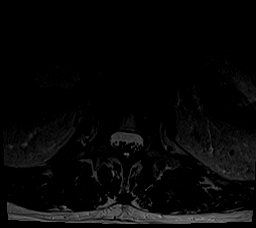
[im 38/38]
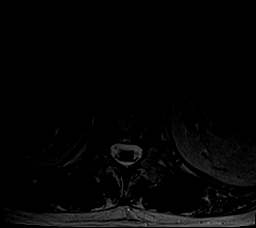

[Series 7: T1 · axial · 4.0mm · 0.35mm/px · z∈[-73,+109]mm · 4 of 38 slices shown (2 of 2)]
[im 1/38]
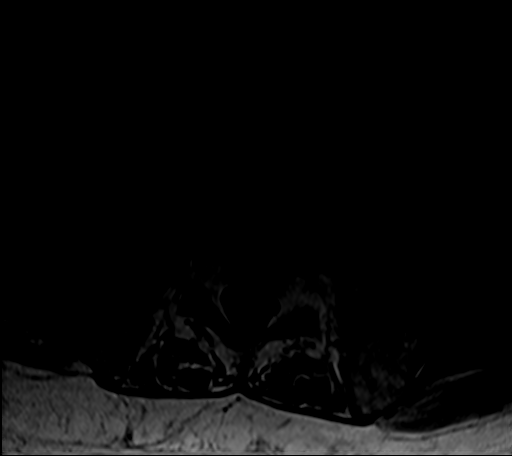
[im 6/38]
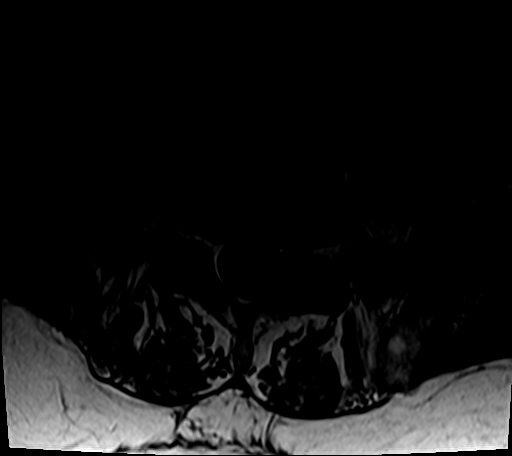
[im 19/38]
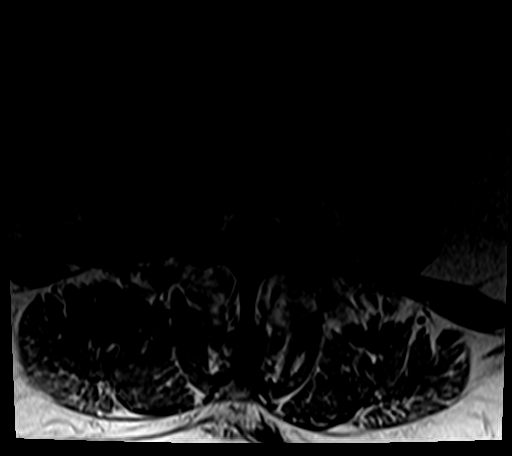
[im 32/38]
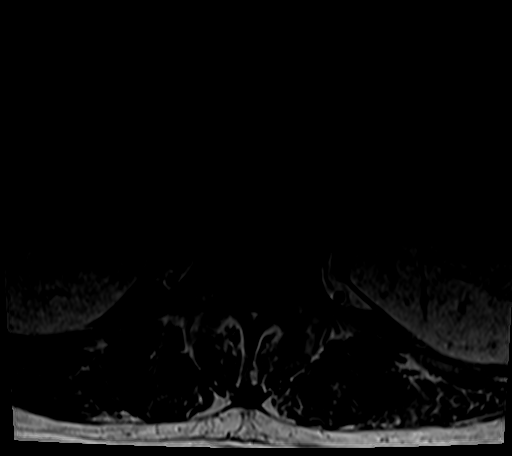

[25 of 48 positions shown; findings below may reference images not displayed]

FINDINGS: Segmentation: 5 non rib-bearing lumbar vertebral bodies. The
inferior-most fully formed intervertebral disc is labeled L5-S1.

Alignment: Dextrocurvature. Trace stepwise retrolisthesis of L1 on
L2, L2 on L3, L3 on L4. Minimal (grade 1) anterolisthesis of L4 on
L5.

Vertebrae: No evidence of acute, osteomyelitis, or suspicious bone
lesion. Scattered benign vertebral venous malformations.

Conus medullaris and cauda equina: Conus extends to the L1-L2 level.
Conus appears normal.

Paraspinal and other soft tissues: Extrarenal pelvis bilaterally.
Possible bilateral parapelvic cyst, suboptimally evaluated on this
study.

Disc levels:

T12-L1: No significant disc protrusion, foraminal stenosis, or canal
stenosis.

L1-L2: Trace retrolisthesis of L1 on L2. Small broad-based disc
bulge and mild bilateral facet hypertrophy without significant canal
or foraminal stenosis.

L2-L3: Trace retrolisthesis of L2 on L3. Broad-based disc bulge with
moderate bilateral facet hypertrophy. Mild bilateral foraminal
stenosis. Mild canal and bilateral subarticular recess stenosis.

L3-L4: Trace retrolisthesis of L3 on L4. Left eccentric broad-based
disc bulge and moderate to severe bilateral facet hypertrophy. Mild
to moderate canal. Moderate and mild right subarticular recess
stenosis. Moderate left and mild right foraminal stenosis.

L4-L5: Mild (grade 1) anterolisthesis of L4 on L5 with uncovering of
the disc. Superimposed left eccentric broad-based disc bulge with
severe left and moderate right facet hypertrophy. Mild canal
stenosis and bilateral subarticular recess stenosis. Mild bilateral
foraminal stenosis.

L5-S1: Degenerative disc disease with disc height loss and
desiccation. Mild posterior osteophytic ridging. Moderate bilateral
facet hypertrophy. No significant canal or foraminal stenosis.
IMPRESSION: 1. At L3-L4 there is mild to moderate canal stenosis and moderate
left and mild right subarticular recess and foraminal stenosis.
2. At L4-L5 there is mild canal stenosis, mild left greater than
right subarticular recess narrowing and foraminal stenosis.

## 2022-12-09 IMAGING — US US RENAL
1 series · 14 of 25 positions shown · non-contrast
Comparison: CT 02/23/2019

CLINICAL DATA: Urinary retention.

EXAM:
RENAL / URINARY TRACT ULTRASOUND COMPLETE

[Series 1: us renal · 14 of 31 slices shown]
[im 1/31]
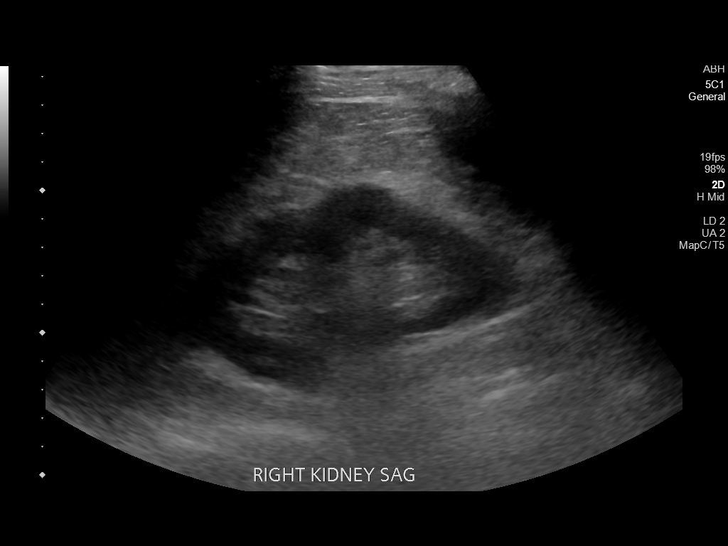
[im 3/31]
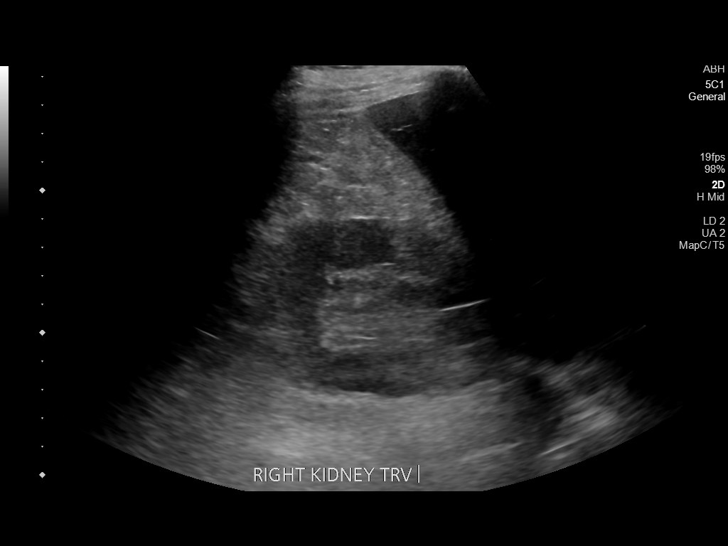
[im 6/31]
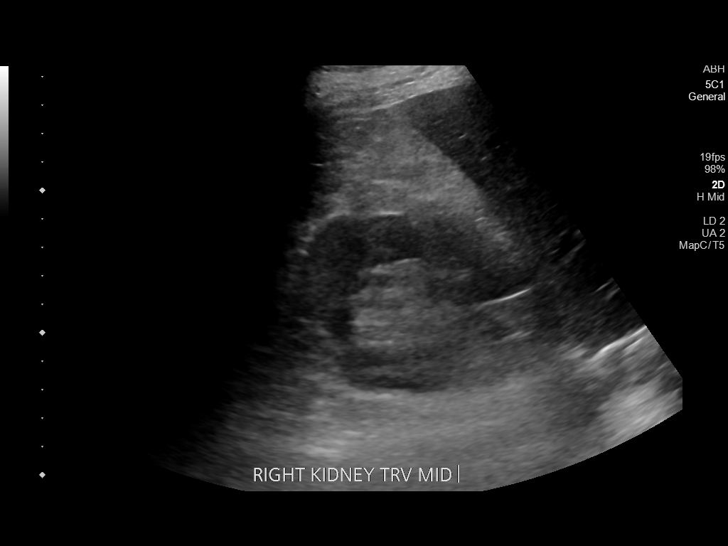
[im 8/31]
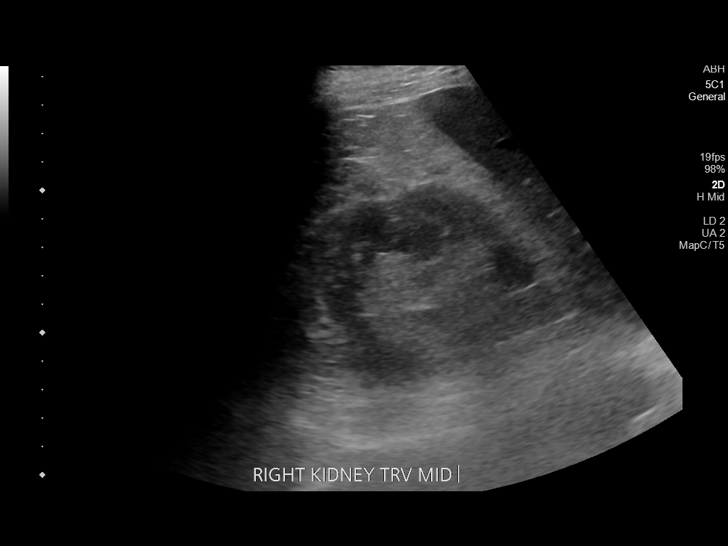
[im 11/31]
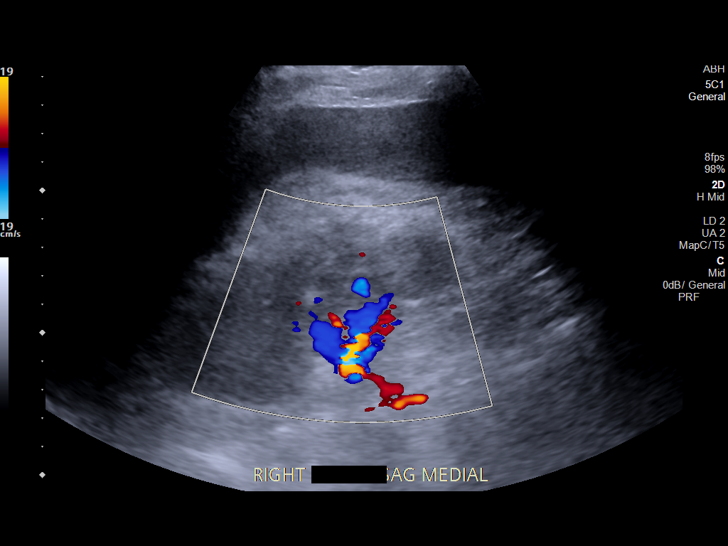
[im 12/31]
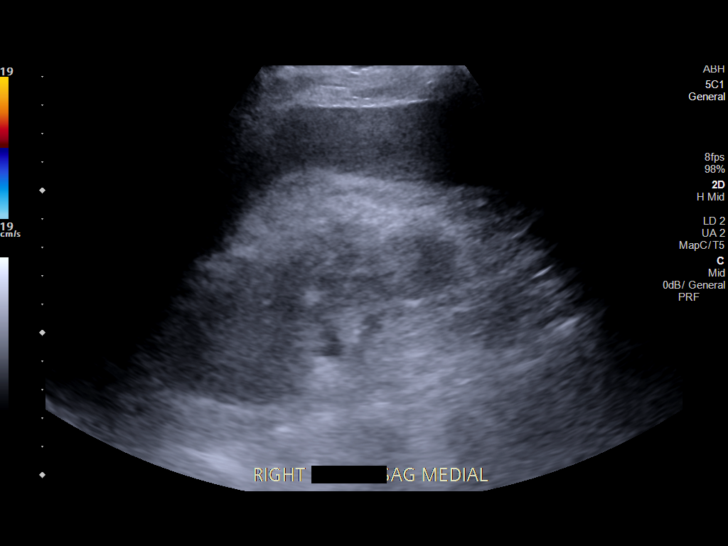
[im 14/31]
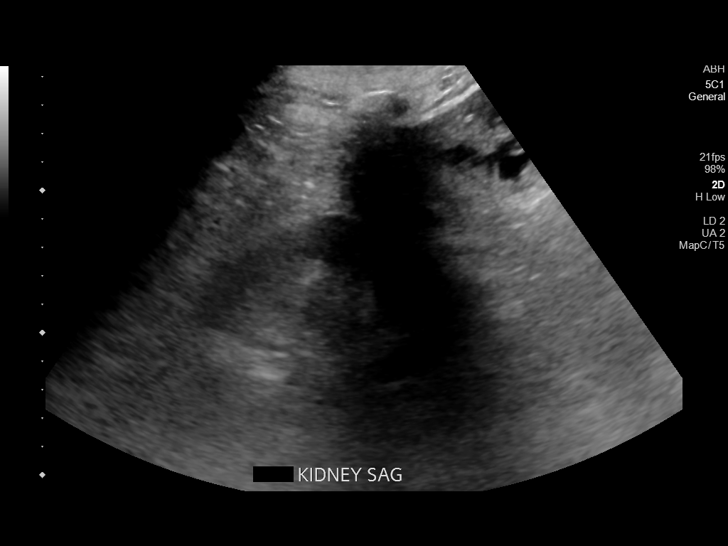
[im 17/31]
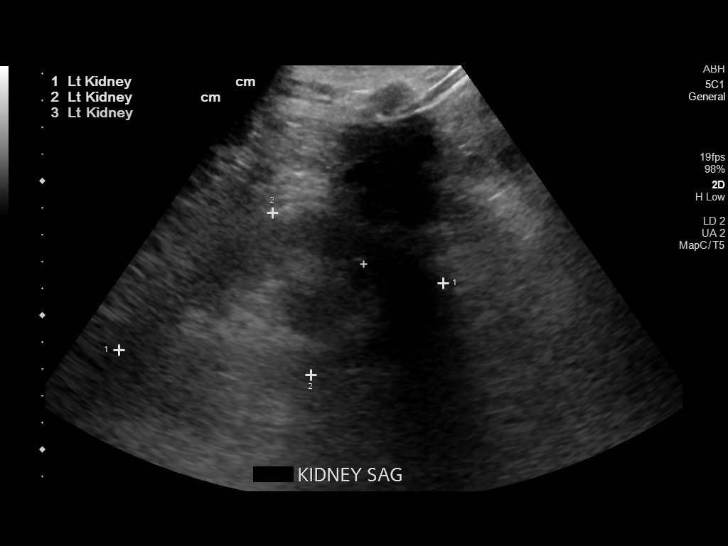
[im 19/31]
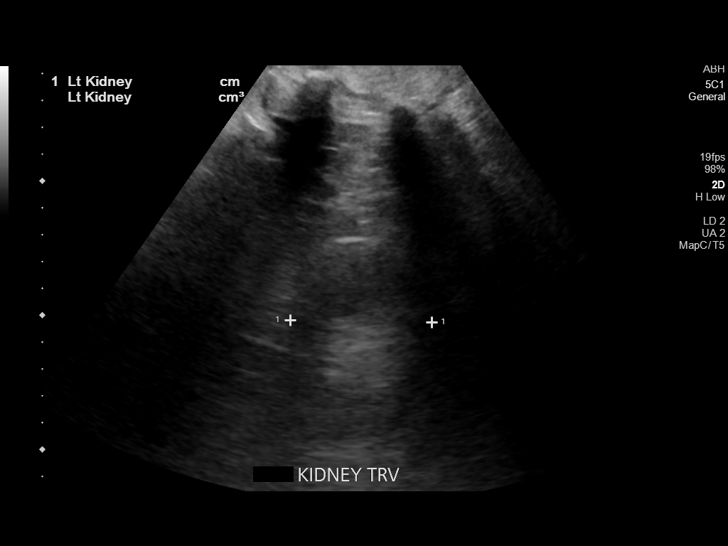
[im 21/31]
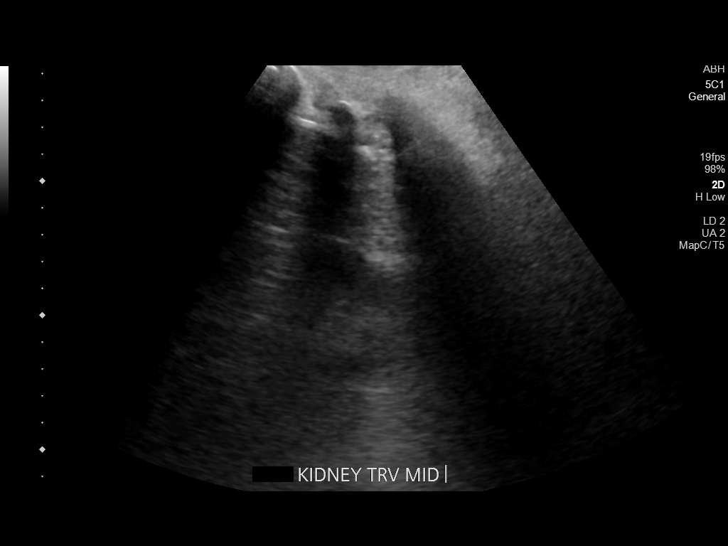
[im 23/31]
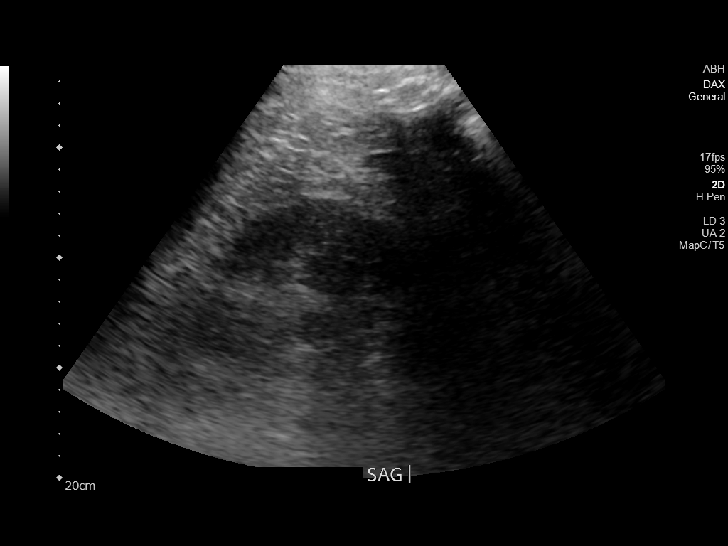
[im 26/31]
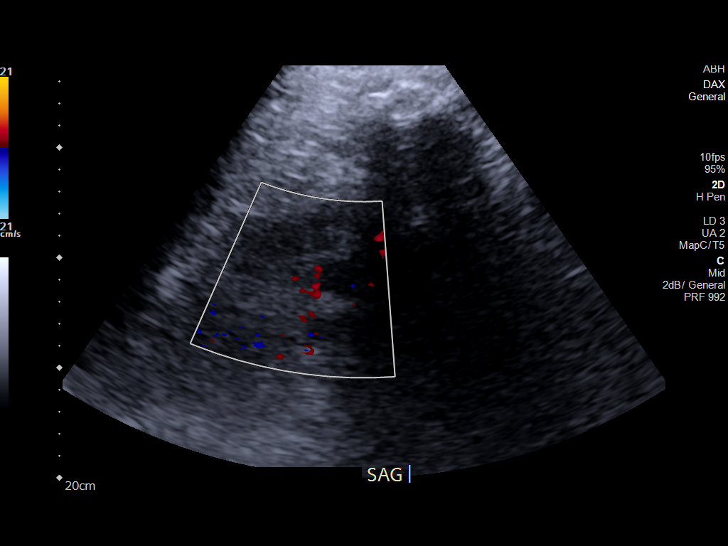
[im 28/31]
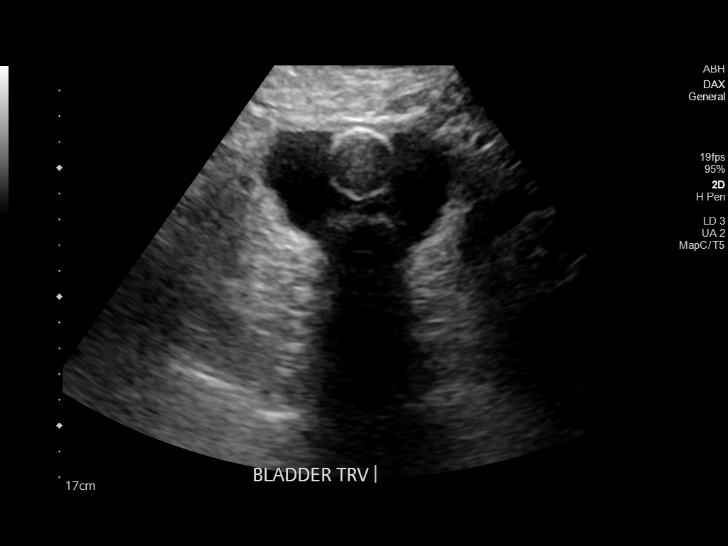
[im 31/31]
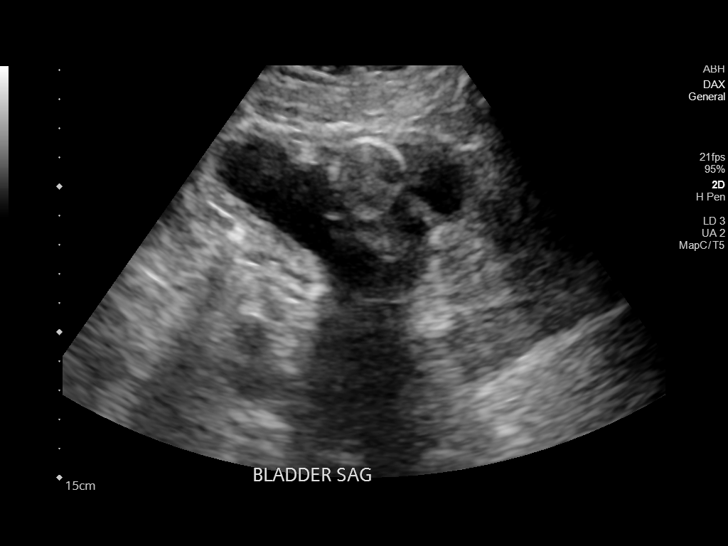

[14 of 25 positions shown; findings below may reference images not displayed]

FINDINGS: Right Kidney:

Renal measurements: 12.7 x 5.7 x 5.4 cm = volume: 204 mL. Borderline
increased parenchymal echogenicity. Previous parapelvic cyst on CT
is not well seen by ultrasound. No hydronephrosis. No visualized
calculi or focal lesion.

Left Kidney:

Renal measurements: 12.3 x 6.2 x 5.3 cm = volume: 211 mL.
Technically challenging assessment due to limited acoustic window
and rib shadowing. Mild increased parenchymal echogenicity. No
hydronephrosis. Previous parapelvic cysts are not visualized. More
detailed assessment is limited.

Bladder:

Foley catheter within the urinary bladder.

Other:

None.
IMPRESSION: 1. Foley catheter decompressing the urinary bladder.
2. No hydronephrosis.
3. Mild bilateral increased renal parenchymal echogenicity. Limited
detailed left renal assessment due to limited acoustic window.
4. Peripelvic cysts on prior CT are not seen.

## 2023-01-31 IMAGING — DX DG CHEST 1V PORT
2 series · 2 of 2 positions shown · non-contrast
Comparison: March 09, 2021

CLINICAL DATA: Fever and weakness

EXAM:
PORTABLE CHEST 1 VIEW

[chest ap (1 of 2)]
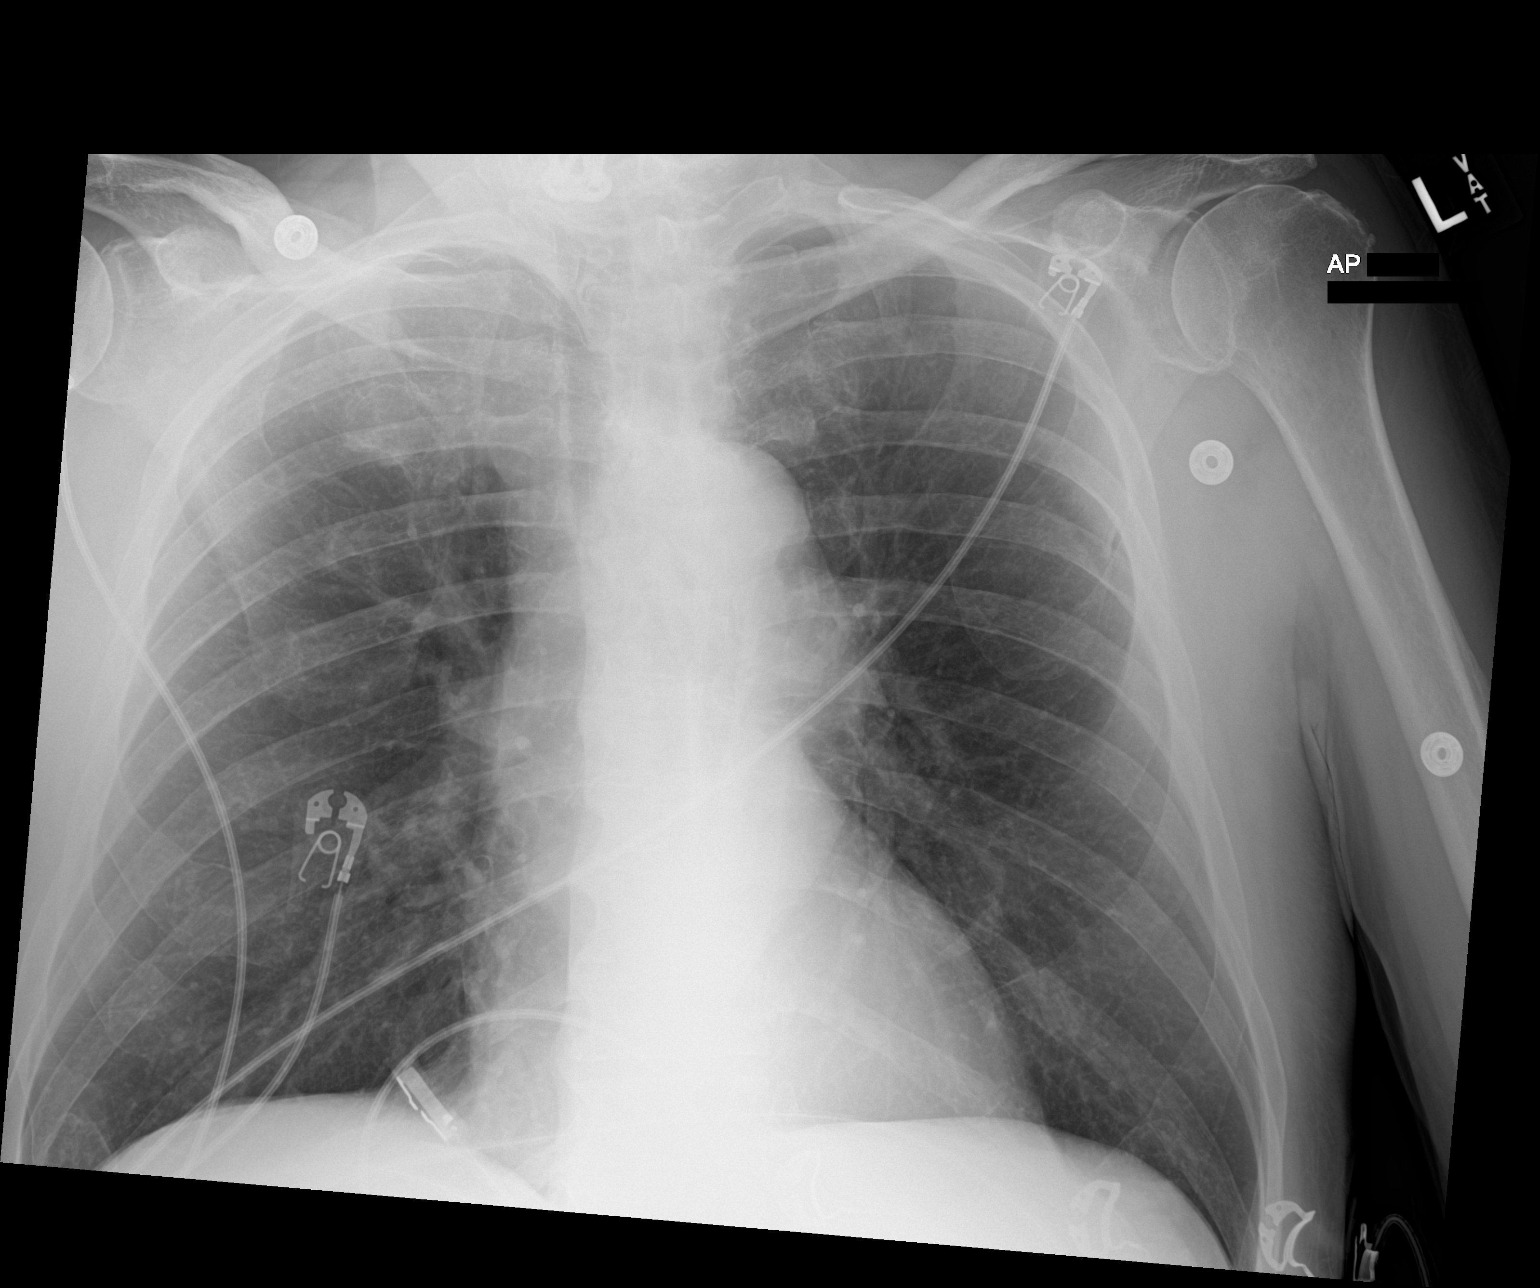

[chest ap (2 of 2)]
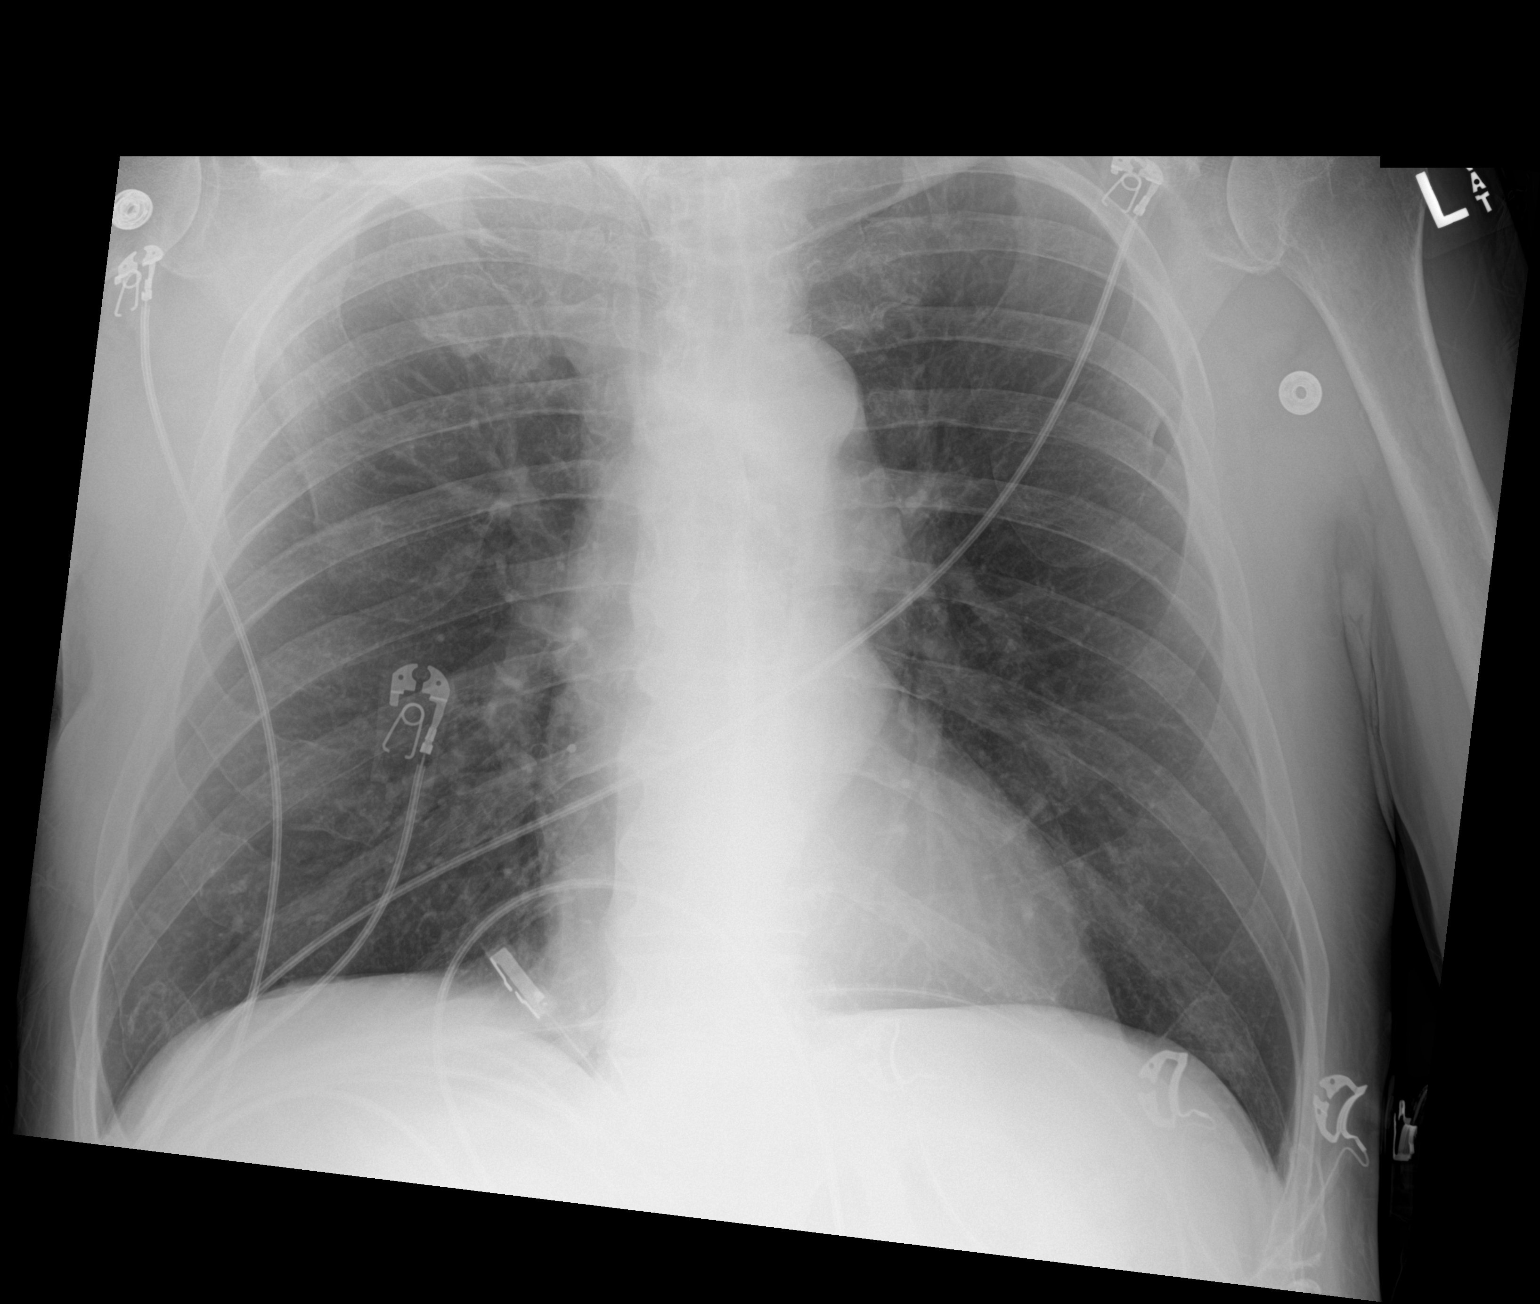

[2 of 2 positions shown; findings below may reference images not displayed]

FINDINGS: No edema or airspace opacity. Heart size and pulmonary vascularity
are normal. No adenopathy. There is postoperative change in the
lower cervical region. No bone lesions.
IMPRESSION: No edema or airspace opacity.  Heart size within normal limits.
